# Patient Record
Sex: Female | Born: 1965 | Race: White | Hispanic: No | Marital: Married | Smoking: Never smoker
Health system: Southern US, Community
[De-identification: ages and names within clinical notes are randomized; demographics above are authoritative.]

## PROBLEM LIST (undated history)

## (undated) DIAGNOSIS — F419 Anxiety disorder, unspecified: Secondary | ICD-10-CM

## (undated) DIAGNOSIS — I82409 Acute embolism and thrombosis of unspecified deep veins of unspecified lower extremity: Secondary | ICD-10-CM

## (undated) DIAGNOSIS — F32A Depression, unspecified: Secondary | ICD-10-CM

## (undated) DIAGNOSIS — M797 Fibromyalgia: Secondary | ICD-10-CM

## (undated) DIAGNOSIS — D649 Anemia, unspecified: Secondary | ICD-10-CM

## (undated) DIAGNOSIS — F329 Major depressive disorder, single episode, unspecified: Secondary | ICD-10-CM

## (undated) DIAGNOSIS — K200 Eosinophilic esophagitis: Secondary | ICD-10-CM

## (undated) DIAGNOSIS — I10 Essential (primary) hypertension: Secondary | ICD-10-CM

## (undated) DIAGNOSIS — T7840XA Allergy, unspecified, initial encounter: Secondary | ICD-10-CM

## (undated) HISTORY — PX: SHOULDER SURGERY: SHX246

## (undated) HISTORY — DX: Eosinophilic esophagitis: K20.0

## (undated) HISTORY — DX: Essential (primary) hypertension: I10

## (undated) HISTORY — PX: FINGER SURGERY: SHX640

## (undated) HISTORY — DX: Fibromyalgia: M79.7

## (undated) HISTORY — DX: Depression, unspecified: F32.A

## (undated) HISTORY — DX: Major depressive disorder, single episode, unspecified: F32.9

## (undated) HISTORY — DX: Acute embolism and thrombosis of unspecified deep veins of unspecified lower extremity: I82.409

## (undated) HISTORY — DX: Anxiety disorder, unspecified: F41.9

## (undated) HISTORY — PX: FRACTURE SURGERY: SHX138

## (undated) HISTORY — DX: Allergy, unspecified, initial encounter: T78.40XA

## (undated) HISTORY — DX: Anemia, unspecified: D64.9

---

## 2001-07-13 ENCOUNTER — Other Ambulatory Visit: Admission: RE | Admit: 2001-07-13 | Discharge: 2001-07-13 | Payer: Self-pay | Admitting: Family Medicine

## 2002-01-07 ENCOUNTER — Encounter: Payer: Self-pay | Admitting: Neurology

## 2002-01-07 ENCOUNTER — Ambulatory Visit (HOSPITAL_COMMUNITY): Admission: RE | Admit: 2002-01-07 | Discharge: 2002-01-07 | Payer: Self-pay | Admitting: Neurology

## 2002-01-16 ENCOUNTER — Ambulatory Visit (HOSPITAL_COMMUNITY): Admission: RE | Admit: 2002-01-16 | Discharge: 2002-01-16 | Payer: Self-pay | Admitting: Neurology

## 2002-08-21 ENCOUNTER — Other Ambulatory Visit: Admission: RE | Admit: 2002-08-21 | Discharge: 2002-08-21 | Payer: Self-pay | Admitting: Family Medicine

## 2002-09-20 ENCOUNTER — Encounter: Payer: Self-pay | Admitting: Neurology

## 2002-09-20 ENCOUNTER — Ambulatory Visit (HOSPITAL_COMMUNITY): Admission: RE | Admit: 2002-09-20 | Discharge: 2002-09-20 | Payer: Self-pay | Admitting: Neurology

## 2002-11-09 ENCOUNTER — Ambulatory Visit (HOSPITAL_BASED_OUTPATIENT_CLINIC_OR_DEPARTMENT_OTHER): Admission: RE | Admit: 2002-11-09 | Discharge: 2002-11-09 | Payer: Self-pay | Admitting: *Deleted

## 2003-09-03 ENCOUNTER — Other Ambulatory Visit: Admission: RE | Admit: 2003-09-03 | Discharge: 2003-09-03 | Payer: Self-pay | Admitting: Family Medicine

## 2004-09-22 ENCOUNTER — Other Ambulatory Visit: Admission: RE | Admit: 2004-09-22 | Discharge: 2004-09-22 | Payer: Self-pay | Admitting: Family Medicine

## 2004-09-25 ENCOUNTER — Encounter: Admission: RE | Admit: 2004-09-25 | Discharge: 2004-09-25 | Payer: Self-pay | Admitting: Neurology

## 2005-10-28 ENCOUNTER — Other Ambulatory Visit: Admission: RE | Admit: 2005-10-28 | Discharge: 2005-10-28 | Payer: Self-pay | Admitting: Family Medicine

## 2010-12-29 ENCOUNTER — Encounter: Payer: Self-pay | Admitting: Cardiology

## 2010-12-30 ENCOUNTER — Ambulatory Visit (INDEPENDENT_AMBULATORY_CARE_PROVIDER_SITE_OTHER): Payer: Federal, State, Local not specified - PPO | Admitting: Cardiology

## 2010-12-30 ENCOUNTER — Encounter: Payer: Self-pay | Admitting: Cardiology

## 2010-12-30 VITALS — BP 124/88 | HR 86 | Resp 16 | Ht 67.0 in | Wt 167.0 lb

## 2010-12-30 DIAGNOSIS — R002 Palpitations: Secondary | ICD-10-CM

## 2010-12-30 NOTE — Patient Instructions (Addendum)
Your physician has requested that you have an echocardiogram. Echocardiography is a painless test that uses sound waves to create images of your heart. It provides your doctor with information about the size and shape of your heart and how well your heart's chambers and valves are working. This procedure takes approximately one hour. There are no restrictions for this procedure.  You will be called with the results of your testing when available.  The current medical regimen is effective;  continue present plan and medications.  Follow up as needed

## 2010-12-30 NOTE — Progress Notes (Signed)
HPI The patient presents for evaluation of palpitations. She says these have been going on for years. She does report an echocardiogram when she was a teenager. She did wear a recent echo Holter that demonstrated premature ventricular contractions. There were no sustained dysrhythmias. She notices these felt most days. They have been particularly when she is tired. It may be somewhat positional. She might get somewhat lightheaded when standing. She's not had any presyncope or syncope. She denies any chest pressure, neck or arm discomfort. She does have some shortness of breath with exertion which has been long-standing but no resting shortness of breath, PND or orthopnea.  She did have a normal TSH recently.  Allergies  Allergen Reactions  . Penicillins     Current Outpatient Prescriptions  Medication Sig Dispense Refill  . escitalopram (LEXAPRO) 10 MG tablet Take 10 mg by mouth daily.        Alain Honey Triphasic (TRIPHASIL, 28, PO) Take by mouth.        . Milnacipran HCl (SAVELLA) 25 MG TABS Take by mouth daily.        . traMADol (ULTRAM) 50 MG tablet Take 50 mg by mouth every 6 (six) hours as needed.          Past Medical History  Diagnosis Date  . Fibromyalgia     Questionable    Past Surgical History  Procedure Date  . Shoulder surgery     Left    Family History  Problem Relation Age of Onset  . Cancer Father     Throat  . Hypertension Mother     History   Social History  . Marital Status: Married    Spouse Name: N/A    Number of Children: N/A  . Years of Education: N/A   Occupational History  . Works with Horses    Social History Main Topics  . Smoking status: Former Games developer  . Smokeless tobacco: Not on file  . Alcohol Use: Not on file  . Drug Use: Not on file  . Sexually Active: Not on file   Other Topics Concern  . Not on file   Social History Narrative  . No narrative on file    ROS:  As stated in the HPI and negative for all other  systems.   PHYSICAL EXAM BP 124/88  Pulse 86  Resp 16  Ht 5\' 7"  (1.702 m)  Wt 167 lb (75.751 kg)  BMI 26.16 kg/m2 GENERAL:  Well appearing HEENT:  Pupils equal round and reactive, fundi not visualized, oral mucosa unremarkable NECK:  No jugular venous distention, waveform within normal limits, carotid upstroke brisk and symmetric, no bruits, no thyromegaly LYMPHATICS:  No cervical, inguinal adenopathy LUNGS:  Clear to auscultation bilaterally BACK:  No CVA tenderness CHEST:  Unremarkable HEART:  PMI not displaced or sustained,S1 and S2 within normal limits, no S3, no S4, no clicks, no rubs, no murmurs ABD:  Flat, positive bowel sounds normal in frequency in pitch, no bruits, no rebound, no guarding, no midline pulsatile mass, no hepatomegaly, no splenomegaly EXT:  2 plus pulses throughout, no edema, no cyanosis no clubbing SKIN:  No rashes no nodules NEURO:  Cranial nerves II through XII grossly intact, motor grossly intact throughout PSYCH:  Cognitively intact, oriented to person place and time  EKG:  Sinus rhythm, rate 76, axis within normal limits, intervals within normal limits, no acute ST-T wave changes.  11/30/10   ASSESSMENT AND PLAN

## 2010-12-30 NOTE — Assessment & Plan Note (Signed)
I reviewed all available records including reviewing the entire Holter monitor tracing. A review of labs. At this point I will order an echocardiogram. She is going to quit caffeine. If the symptoms improve or her echo is normal no workup otherwise would be required. If she has continued symptoms I would consider calcium channel blocker or beta blocker.

## 2010-12-31 ENCOUNTER — Encounter: Payer: Self-pay | Admitting: Cardiology

## 2011-01-07 ENCOUNTER — Ambulatory Visit (HOSPITAL_COMMUNITY): Payer: Federal, State, Local not specified - PPO | Attending: Cardiology

## 2011-01-07 DIAGNOSIS — R0989 Other specified symptoms and signs involving the circulatory and respiratory systems: Secondary | ICD-10-CM | POA: Insufficient documentation

## 2011-01-07 DIAGNOSIS — I379 Nonrheumatic pulmonary valve disorder, unspecified: Secondary | ICD-10-CM | POA: Insufficient documentation

## 2011-01-07 DIAGNOSIS — I059 Rheumatic mitral valve disease, unspecified: Secondary | ICD-10-CM | POA: Insufficient documentation

## 2011-01-07 DIAGNOSIS — R0609 Other forms of dyspnea: Secondary | ICD-10-CM | POA: Insufficient documentation

## 2011-01-07 DIAGNOSIS — I079 Rheumatic tricuspid valve disease, unspecified: Secondary | ICD-10-CM | POA: Insufficient documentation

## 2011-01-07 DIAGNOSIS — R002 Palpitations: Secondary | ICD-10-CM | POA: Insufficient documentation

## 2011-01-15 ENCOUNTER — Telehealth: Payer: Self-pay | Admitting: Cardiology

## 2011-01-15 NOTE — Telephone Encounter (Signed)
Pt is aware of normal echo results 

## 2011-01-15 NOTE — Telephone Encounter (Signed)
Pt calling wanting to know results of pt ECHO. Please return call to discuss further.

## 2012-07-24 ENCOUNTER — Other Ambulatory Visit: Payer: Self-pay | Admitting: *Deleted

## 2012-08-08 ENCOUNTER — Ambulatory Visit (INDEPENDENT_AMBULATORY_CARE_PROVIDER_SITE_OTHER): Payer: Federal, State, Local not specified - PPO | Admitting: Family Medicine

## 2012-08-08 ENCOUNTER — Encounter: Payer: Self-pay | Admitting: Family Medicine

## 2012-08-08 VITALS — BP 144/93 | HR 83 | Temp 99.1°F | Wt 183.6 lb

## 2012-08-08 DIAGNOSIS — IMO0001 Reserved for inherently not codable concepts without codable children: Secondary | ICD-10-CM | POA: Insufficient documentation

## 2012-08-08 DIAGNOSIS — F32A Depression, unspecified: Secondary | ICD-10-CM | POA: Insufficient documentation

## 2012-08-08 DIAGNOSIS — F3289 Other specified depressive episodes: Secondary | ICD-10-CM

## 2012-08-08 DIAGNOSIS — F329 Major depressive disorder, single episode, unspecified: Secondary | ICD-10-CM

## 2012-08-08 DIAGNOSIS — R635 Abnormal weight gain: Secondary | ICD-10-CM | POA: Insufficient documentation

## 2012-08-08 DIAGNOSIS — M797 Fibromyalgia: Secondary | ICD-10-CM | POA: Insufficient documentation

## 2012-08-08 DIAGNOSIS — R030 Elevated blood-pressure reading, without diagnosis of hypertension: Secondary | ICD-10-CM

## 2012-08-08 MED ORDER — ESCITALOPRAM OXALATE 5 MG PO TABS: 5.0000 mg | ORAL_TABLET | Freq: Every day | ORAL | Status: DC

## 2012-08-08 MED ORDER — TRAMADOL HCL 50 MG PO TABS: 50.0000 mg | ORAL_TABLET | Freq: Four times a day (QID) | ORAL | Status: DC | PRN

## 2012-08-08 NOTE — Assessment & Plan Note (Signed)
Persistent and needs additional work up. Vitamin D, ferritin.TSH.

## 2012-08-08 NOTE — Progress Notes (Signed)
Patient ID: Rita Barry, female   DOB: [birth year], 47 y.o.   MRN: [id number] SUBJECTIVE:  HPI: Here for: Depression: the lexapro hasn't really help. Patient is a horse massage therapist. Fibromyalgia: restless at nights.Not restless legs syndrome. Has nightmares. Used to work in [employer] and since changing career has had decreased drive and fatigue. Weight gain. Stress : some and BP elevated today.   PMH/PSH: reviewed/updated in Epic  SH/FH: reviewed/updated in Epic  Allergies: reviewed/updated in Epic  Medications: reviewed/updated in Epic  Immunizations: reviewed/updated in Epic   ROS: As above in the HPI. All other systems are stable or negative.   OBJECTIVE: APPEARANCE:  Patient in no acute distress.The patient appeared well nourished and normally developed. Acyanotic.  Waist:  VITAL SIGNS:  SKIN: warm and  Dry without overt rashes, tattoos and scars  HEAD and Neck: without JVD, Normal No scleral icterus  CHEST & LUNGS: Clear  CVS: Reveals the PMI to be normally located. Regular rhythm, First and Second Heart sounds are normal, and absence of murmurs, rubs or gallops.  ABDOMEN:  Benign,, no organomegaly, no masses, no Abdominal Aortic enlargement. No Guarding , no rebound. No Bruits.  RECTAL:n/a  GU:N/a, deferred to GYN.  EXTREMETIES: nonedematous. Both Femoral and Pedal pulses are normal.  MUSCULOSKELETAL:  Spine: normal Joints: intact  NEUROLOGIC: oriented to time,place and person; nonfocal. Strength is normal Sensory is normal Reflexes are normal Cranial Nerves are normal.  Psychological: not suicidal, not hallucinating  ASSESSMENT: Fibromyalgia Persistent and needs additional work up. Vitamin D, ferritin.TSH.  Depression I suspect that patient needs to explore her life goals. Discussed this with her. Also await labs. Possible sleep deprivation because of interrupted sleep cycles.  Elevated BP Advised to review diet and  monitor BP daily and bring in. If BP is 135/85 and higher will need to see in 2 months.  Weight gain Await labs, cbc , cmp, tsh.    PLAN: Orders Placed This Encounter  Procedures  . Vitamin D 25 hydroxy    Standing Status: Future     Number of Occurrences:      Standing Expiration Date: 08/08/2013  . COMPLETE METABOLIC PANEL WITH GFR    Standing Status: Future     Number of Occurrences:      Standing Expiration Date: 08/08/2013  . TSH    Standing Status: Future     Number of Occurrences:      Standing Expiration Date: 08/08/2013  . Ferritin    Standing Status: Future     Number of Occurrences:      Standing Expiration Date: 08/08/2013  . CBC with Differential    Standing Status: Future     Number of Occurrences:      Standing Expiration Date: 08/08/2013   No results found for this or any previous visit (from the past 24 hour(s)).                               Meds ordered this encounter  Medications  . escitalopram (LEXAPRO) 5 MG tablet    Sig: Take 1 tablet (5 mg total) by mouth daily.    Dispense:  30 tablet    Refill:  5  . traMADol (ULTRAM) 50 MG tablet    Sig: Take 1 tablet (50 mg total) by mouth every 6 (six) hours as needed.    Dispense:  30 tablet    Refill:  1  RTC 3  months.  Veola Cafaro P. Modesto Charon, M.D.

## 2012-08-08 NOTE — Assessment & Plan Note (Signed)
Advised to review diet and monitor BP daily and bring in. If BP is 135/85 and higher will need to see in 2 months.

## 2012-08-08 NOTE — Assessment & Plan Note (Signed)
I suspect that patient needs to explore her life goals. Discussed this with her. Also await labs. Possible sleep deprivation because of interrupted sleep cycles.

## 2012-08-08 NOTE — Assessment & Plan Note (Signed)
Await labs, cbc , cmp, tsh.

## 2012-08-09 ENCOUNTER — Other Ambulatory Visit: Payer: Federal, State, Local not specified - PPO

## 2012-08-09 DIAGNOSIS — F32A Depression, unspecified: Secondary | ICD-10-CM

## 2012-08-09 DIAGNOSIS — M797 Fibromyalgia: Secondary | ICD-10-CM

## 2012-08-09 DIAGNOSIS — F329 Major depressive disorder, single episode, unspecified: Secondary | ICD-10-CM

## 2012-08-09 DIAGNOSIS — IMO0001 Reserved for inherently not codable concepts without codable children: Secondary | ICD-10-CM

## 2012-08-09 LAB — POCT CBC
Granulocyte percent: 72.2 %G (ref 37–80)
HCT, POC: 36.4 % — AB (ref 37.7–47.9)
Hemoglobin: 12.2 g/dL (ref 12.2–16.2)
Lymph, poc: 1.6 (ref 0.6–3.4)
MCH, POC: 32.1 pg — AB (ref 27–31.2)
MCHC: 33.6 g/dL (ref 31.8–35.4)
MCV: 95.6 fL (ref 80–97)
MPV: 7.6 fL (ref 0–99.8)
POC Granulocyte: 5.6 (ref 2–6.9)
POC LYMPH PERCENT: 20.2 %L (ref 10–50)
Platelet Count, POC: 328 10*3/uL (ref 142–424)
RBC: 3.8 M/uL — AB (ref 4.04–5.48)
RDW, POC: 13.8 %
WBC: 7.7 10*3/uL (ref 4.6–10.2)

## 2012-08-09 LAB — COMPLETE METABOLIC PANEL WITH GFR
ALT: 22 U/L (ref 0–35)
AST: 22 U/L (ref 0–37)
Albumin: 4 g/dL (ref 3.5–5.2)
Alkaline Phosphatase: 17 U/L — ABNORMAL LOW (ref 39–117)
BUN: 14 mg/dL (ref 6–23)
CO2: 25 mEq/L (ref 19–32)
Calcium: 9 mg/dL (ref 8.4–10.5)
Chloride: 101 mEq/L (ref 96–112)
Creat: 0.66 mg/dL (ref 0.50–1.10)
GFR, Est African American: >89 mL/min
GFR, Est Non African American: >89 mL/min
Glucose, Bld: 85 mg/dL (ref 70–99)
Potassium: 4.2 mEq/L (ref 3.5–5.3)
Sodium: 135 mEq/L (ref 135–145)
Total Bilirubin: 0.6 mg/dL (ref 0.3–1.2)
Total Protein: 6.6 g/dL (ref 6.0–8.3)

## 2012-08-09 LAB — FERRITIN: Ferritin: 80 ng/mL (ref 10–291)

## 2012-08-09 LAB — TSH: TSH: 1.386 u[IU]/mL (ref 0.350–4.500)

## 2012-08-09 NOTE — Progress Notes (Signed)
Pt came in for labs only 

## 2012-08-10 LAB — VITAMIN D 25 HYDROXY (VIT D DEFICIENCY, FRACTURES): Vit D, 25-Hydroxy: 46 ng/mL (ref 30–89)

## 2012-08-11 NOTE — Progress Notes (Signed)
Quick Note:  Call patient. Labs normal. No change in plan. ______ 

## 2012-08-14 ENCOUNTER — Other Ambulatory Visit: Payer: Self-pay | Admitting: *Deleted

## 2012-08-14 MED ORDER — LEVONORG-ETH ESTRAD TRIPHASIC PO TABS: 1.0000 | ORAL_TABLET | Freq: Every day | ORAL | Status: DC

## 2012-08-14 NOTE — Telephone Encounter (Signed)
LAST PAP 5/13

## 2012-10-02 ENCOUNTER — Other Ambulatory Visit: Payer: Self-pay | Admitting: Family Medicine

## 2012-10-03 NOTE — Telephone Encounter (Signed)
Last seen and filled by you on 08/08/12

## 2012-11-02 ENCOUNTER — Other Ambulatory Visit: Payer: Self-pay | Admitting: Family Medicine

## 2012-11-02 NOTE — Telephone Encounter (Signed)
Patient last seen in office on 08-18-12 by Modesto Charon. Please advise

## 2012-11-08 ENCOUNTER — Ambulatory Visit (INDEPENDENT_AMBULATORY_CARE_PROVIDER_SITE_OTHER): Payer: Federal, State, Local not specified - PPO | Admitting: Family Medicine

## 2012-11-08 ENCOUNTER — Encounter: Payer: Self-pay | Admitting: Family Medicine

## 2012-11-08 VITALS — BP 142/91 | HR 79 | Temp 98.8°F | Ht 67.5 in | Wt 176.0 lb

## 2012-11-08 DIAGNOSIS — F3289 Other specified depressive episodes: Secondary | ICD-10-CM

## 2012-11-08 DIAGNOSIS — R030 Elevated blood-pressure reading, without diagnosis of hypertension: Secondary | ICD-10-CM

## 2012-11-08 DIAGNOSIS — F32A Depression, unspecified: Secondary | ICD-10-CM

## 2012-11-08 DIAGNOSIS — IMO0001 Reserved for inherently not codable concepts without codable children: Secondary | ICD-10-CM

## 2012-11-08 DIAGNOSIS — M797 Fibromyalgia: Secondary | ICD-10-CM

## 2012-11-08 DIAGNOSIS — F329 Major depressive disorder, single episode, unspecified: Secondary | ICD-10-CM

## 2012-11-08 MED ORDER — TRAMADOL HCL 50 MG PO TABS: ORAL_TABLET | ORAL | Status: DC

## 2012-11-08 MED ORDER — ESCITALOPRAM OXALATE 5 MG PO TABS: 20.0000 mg | ORAL_TABLET | Freq: Every day | ORAL | Status: DC

## 2012-11-08 NOTE — Progress Notes (Signed)
Patient ID: Rita Barry, female   DOB: June 01, 1965, 47 y.o.   MRN: [id number] SUBJECTIVE: CC: Came for  Follow up of meds HPI: Was doing fairly well until she cut the lexapro and noticed that she did not feel well and had low energy and felt a lack of interest. Not really depressed. She is now convinced she needs to stay on the lexapro and may even need to increase the dose, because she felt  Better when it was increased. Lost some  Weight and working on being healthier. Time occupied helping to take care of Hurshel Keys whose Preacher husband  Died recently.not much time for herself. 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 Has monitored her BPs and they run a little above normal up to 140/90 sometimes, but mostly 130s/80s. Would like to work on diet and exercise to reduce the BP more.  Past Medical History  Diagnosis Date  . Fibromyalgia     Questionable  . Anxiety   . Depression    Past Surgical History  Procedure Laterality Date  . Shoulder surgery      Left  . Shoulder surgery Left     x2 surgeries   History   Social History  . Marital Status: Married    Spouse Name: N/A    Number of Children: N/A  . Years of Education: N/A   Occupational History  . Works with Horses    Social History Main Topics  . Smoking status: Never Smoker   . Smokeless tobacco: Never Used  . Alcohol Use: 2.4 oz/week    4 Glasses of wine per week  . Drug Use: No  . Sexually Active: Not on file   Other Topics Concern  . Not on file   Social History Narrative  . No narrative on file   Family History  Problem Relation Age of Onset  . Cancer Father     Throat  . Hypertension Mother   . Hyperlipidemia Mother   . Cancer Mother    Current Outpatient Prescriptions on File Prior to Visit  Medication Sig Dispense Refill  . levonorgestrel-ethinyl estradiol (ENPRESSE-28) tablet Take 1 tablet by mouth daily.  1 Package  2   No current facility-administered  medications on file prior to visit.   Allergies  Allergen Reactions  . Penicillins     There is no immunization history on file for this patient. Prior to Admission medications   Medication Sig Start Date End Date Taking? Authorizing Provider  escitalopram (LEXAPRO) 5 MG tablet Take 4 tablets (20 mg total) by mouth daily. 11/08/12  Yes Ileana Ladd, MD  levonorgestrel-ethinyl estradiol [facility]) tablet Take 1 tablet by mouth daily. 08/14/12  Yes Mary-Margaret Daphine Deutscher, FNP  traMADol (ULTRAM) 50 MG tablet TAKE 1 TABLET BY MOUTH EVERY 6 HOURS AS NEEDED 11/08/12  Yes Ileana Ladd, MD     ROS: As above in the HPI. All other systems are stable or negative.  OBJECTIVE: APPEARANCE:  Patient in no acute distress.The patient appeared well nourished and normally developed. Acyanotic. Waist: VITAL SIGNS:BP 142/91  Pulse 79  Temp(Src) 98.8 F (37.1 C) (Oral)  Ht 5' 7.5" (1.715 m)  Wt 176 lb (79.833 kg)  BMI 27.14 kg/m2 WF Overweight.  SKIN: warm and  Dry without overt rashes, tattoos and scars. No lesions  Seen but she c/o having sun exposure and small tender bumps popping up.  HEAD and Neck: without JVD, Head and scalp: normal Eyes:No scleral  icterus. Fundi normal, eye movements normal. Ears: Auricle normal, canal normal, Tympanic membranes normal, insufflation normal. Nose: normal Throat: normal Neck & thyroid: normal  CHEST & LUNGS: Chest wall: normal Lungs: Clear  CVS: Reveals the PMI to be normally located. Regular rhythm, First and Second Heart sounds are normal,  absence of murmurs, rubs or gallops. Peripheral vasculature: Radial pulses: normal Dorsal pedis pulses: normal Posterior pulses: normal  ABDOMEN:  Appearance: overweigt Benign, no organomegaly, no masses, no Abdominal Aortic enlargement. No Guarding , no rebound. No Bruits. Bowel sounds: normal  RECTAL: N/A GU: N/A  EXTREMETIES: nonedematous. Both Femoral and Pedal pulses are  normal.  MUSCULOSKELETAL:  Spine: normal Joints: knees and hips, pain on ROM is mild. Muscles, mild  Trigger points for fibromyalgias  NEUROLOGIC: oriented to time,place and person; nonfocal. Strength is normal Sensory is normal Reflexes are normal Cranial Nerves are normal.  Psychiatry: not suicidal, not hallucinating. Does c/o of  Fatigue and low energy.  Results for orders placed in visit on 08/09/12  VITAMIN D 25 HYDROXY      Result Value Range   Vit D, 25-Hydroxy 46  30 - 89 ng/mL  COMPLETE METABOLIC PANEL WITH GFR      Result Value Range   Sodium 135  135 - 145 mEq/L   Potassium 4.2  3.5 - 5.3 mEq/L   Chloride 101  96 - 112 mEq/L   CO2 25  19 - 32 mEq/L   Glucose, Bld 85  70 - 99 mg/dL   BUN 14  6 - 23 mg/dL   Creat 4.09  8.11 - 9.14 mg/dL   Total Bilirubin 0.6  0.3 - 1.2 mg/dL   Alkaline Phosphatase 17 (*) 39 - 117 U/L   AST 22  0 - 37 U/L   ALT 22  0 - 35 U/L   Total Protein 6.6  6.0 - 8.3 g/dL   Albumin 4.0  3.5 - 5.2 g/dL   Calcium 9.0  8.4 - 78.2 mg/dL   GFR, Est African American >89     GFR, Est Non African American >89    TSH      Result Value Range   TSH 1.386  0.350 - 4.500 uIU/mL  FERRITIN      Result Value Range   Ferritin 80  10 - 291 ng/mL  POCT CBC      Result Value Range   WBC 7.7  4.6 - 10.2 K/uL   Lymph, poc 1.6  0.6 - 3.4   POC LYMPH PERCENT 20.2  10 - 50 %L   MID (cbc)    0 - 0.9   POC MID %    0 - 12 %M   POC Granulocyte 5.6  2 - 6.9   Granulocyte percent 72.2  37 - 80 %G   RBC 3.8 (*) 4.04 - 5.48 M/uL   Hemoglobin 12.2  12.2 - 16.2 g/dL   HCT, POC 95.6 (*) 21.3 - 47.9 %   MCV 95.6  80 - 97 fL   MCH, POC 32.1 (*) 27 - 31.2 pg   MCHC 33.6  31.8 - 35.4 g/dL   RDW, POC 08.6     Platelet Count, POC 328.0  142 - 424 K/uL   MPV 7.6  0 - 99.8 fL    ASSESSMENT: Fibromyalgia - Plan: traMADol (ULTRAM) 50 MG tablet  Depression - Plan: escitalopram (LEXAPRO) 5 MG tablet  Elevated BP   PLAN: Recommended Dr Terri Piedra for   Dermatology. Patient to  let me know if she needs a referral for her scalp concerns.   Meds ordered this encounter  Medications  . escitalopram (LEXAPRO) 5 MG tablet    Sig: Take 4 tablets (20 mg total) by mouth daily.    Dispense:  30 tablet    Refill:  5  . traMADol (ULTRAM) 50 MG tablet    Sig: TAKE 1 TABLET BY MOUTH EVERY 6 HOURS AS NEEDED    Dispense:  30 tablet    Refill:  2   DASH diet, exercise weight loss, balancing her life with time for herself. Counselled.  Return in about 3 months (around 02/08/2013) for Recheck medical problems.  Kyrin Garn P. Modesto Charon, M.D.

## 2012-11-09 ENCOUNTER — Other Ambulatory Visit: Payer: Self-pay | Admitting: Nurse Practitioner

## 2012-11-10 NOTE — Telephone Encounter (Signed)
Last saw you 2 days ago

## 2012-12-04 ENCOUNTER — Encounter: Payer: Self-pay | Admitting: *Deleted

## 2012-12-18 ENCOUNTER — Telehealth: Payer: Self-pay | Admitting: Family Medicine

## 2012-12-19 ENCOUNTER — Other Ambulatory Visit: Payer: Self-pay | Admitting: *Deleted

## 2012-12-19 ENCOUNTER — Other Ambulatory Visit: Payer: Self-pay | Admitting: Nurse Practitioner

## 2012-12-19 MED ORDER — LEVONORG-ETH ESTRAD TRIPHASIC PO TABS: ORAL_TABLET | ORAL | Status: DC

## 2012-12-19 NOTE — Telephone Encounter (Signed)
LAST PAP 5/13. LAST OV 7/14

## 2013-01-02 ENCOUNTER — Other Ambulatory Visit: Payer: Self-pay | Admitting: Family Medicine

## 2013-01-04 ENCOUNTER — Telehealth: Payer: Self-pay | Admitting: *Deleted

## 2013-01-04 ENCOUNTER — Encounter: Payer: Self-pay | Admitting: Family Medicine

## 2013-01-04 ENCOUNTER — Ambulatory Visit (INDEPENDENT_AMBULATORY_CARE_PROVIDER_SITE_OTHER): Payer: Federal, State, Local not specified - PPO | Admitting: Family Medicine

## 2013-01-04 VITALS — BP 149/91 | HR 83 | Temp 99.0°F | Ht 67.25 in | Wt 176.0 lb

## 2013-01-04 DIAGNOSIS — H9190 Unspecified hearing loss, unspecified ear: Secondary | ICD-10-CM

## 2013-01-04 DIAGNOSIS — R42 Dizziness and giddiness: Secondary | ICD-10-CM

## 2013-01-04 MED ORDER — MECLIZINE HCL 25 MG PO TABS: 25.0000 mg | ORAL_TABLET | Freq: Three times a day (TID) | ORAL | Status: DC | PRN

## 2013-01-04 NOTE — Progress Notes (Signed)
  Subjective:    Patient ID: Rita Barry, female    DOB: 1965-08-08, 47 y.o.   MRN: [id number]  HPI  Patient presents today with chief complaint of dizziness and hearing loss. Patient states she has a prior history of vertiginous symptoms and has to have been related to questionable ear infections. Patient states she's had intermittent dizziness, headache, decreased hearing over the past 2-3 days. Predominant symptoms started over the weekend. Symptoms resolved on Sunday. Symptoms seem to acutely worsen as a beginning of the week started. Patient denies any recent infections. No fevers or chills. Patient also has a mild generalized headache. Patient states that symptoms are similar to previous episodes of vertigo however headache and hearing loss he be worsen. Has been on meclizine the past and this has seemed to help in the past. No recent trauma.   Review of Systems  All other systems reviewed and are negative.       Objective:   Physical Exam  Constitutional: She is oriented to person, place, and time. She appears well-developed and well-nourished.  HENT:  Head: Normocephalic and atraumatic.  Right Ear: External ear normal.  Left Ear: External ear normal.  Mouth/Throat: Oropharynx is clear and moist.  Eyes: Conjunctivae are normal. Pupils are equal, round, and reactive to light.  Neck: Normal range of motion.  Cardiovascular: Normal rate, regular rhythm and normal heart sounds.   Pulmonary/Chest: Effort normal and breath sounds normal.  Abdominal: Soft.  Neurological: She is alert and oriented to person, place, and time.  + horizontal nystagmus bilaterally  + dix hallpike bilaterally    Skin: Skin is warm.          Assessment & Plan:  Vertigo - Plan: Ambulatory referral to ENT, meclizine (ANTIVERT) 25 MG tablet, PR TYMPANOMETRY  Dizziness  Decreased hearing, unspecified laterality  Differential diagnosis for symptoms as fairly broad including benign positional  vertigo, labyrinthitis, Mnire's disease, intracranial pathology. Will start patient on meclizine for symptomatic treatment. Hearing screen here in clinic was initially normal. However, will need formal testing. Will refer patient to ENT for further evaluation of symptoms. Discussed neurologic as well as ENT red flags at length. Followup as needed.

## 2013-01-04 NOTE — Telephone Encounter (Signed)
Seen and filled 11/08/12, you gave 2 refills, but we can not give refills on Tramadol anymore, if approved route to pool B, to call pt to pickup

## 2013-01-04 NOTE — Telephone Encounter (Signed)
Patient has remaining refills on Tramadol but since it has changed categories the pharmacy needed an authorization from our office to fill it.  They told the patient they would contact us about refilling it but I don't see any documentation.  Patient was also told at her last appt that she could take up to 4 of her Lexapro 5mg  to equal 20mg  a day but she was only given a script for #30.  Her insurance wouldn't pay for a refill quicker than 30 days.  Would you like to change this prescription?  She has gone back to taking 5mg  daily.  She has a f/u appt with Dr. Modesto Charon in October.

## 2013-01-09 ENCOUNTER — Ambulatory Visit (INDEPENDENT_AMBULATORY_CARE_PROVIDER_SITE_OTHER): Payer: Federal, State, Local not specified - PPO | Admitting: Obstetrics & Gynecology

## 2013-01-09 ENCOUNTER — Encounter: Payer: Self-pay | Admitting: Obstetrics & Gynecology

## 2013-01-09 VITALS — BP 141/89 | HR 84 | Resp 16 | Ht 67.0 in | Wt 179.0 lb

## 2013-01-09 DIAGNOSIS — Z3009 Encounter for other general counseling and advice on contraception: Secondary | ICD-10-CM

## 2013-01-09 DIAGNOSIS — N946 Dysmenorrhea, unspecified: Secondary | ICD-10-CM

## 2013-01-09 MED ORDER — LEVONORGEST-ETH ESTRAD 91-DAY 0.15-0.03 &0.01 MG PO TABS: 1.0000 | ORAL_TABLET | Freq: Every day | ORAL | Status: DC

## 2013-01-09 NOTE — Patient Instructions (Signed)
Oral Contraception Use  Oral contraceptives (OCs) are medicines taken to prevent pregnancy. OCs work by preventing the ovaries from releasing eggs. The hormones in OCs also cause the cervical mucus to thicken, preventing the sperm from entering the uterus. The hormones also cause the uterine lining to become thin, not allowing a fertilized egg to attach to the inside of the uterus. OCs are highly effective when taken exactly as prescribed. However, OCs do not prevent sexually transmitted diseases (STDs). Safe sex practices, such as using condoms along with an OC, can help prevent STDs.   Before taking OCs, you may have a physical exam and Pap test. Your caregiver may also order blood tests if necessary. Your caregiver will make sure you are a good candidate for oral contraception. Discuss with your caregiver the possible side effects of the OC you may be prescribed. When starting an OC, it can take 2 to 3 months for the body to adjust to the changes in hormone levels in your body.   HOW TO TAKE ORAL CONTRACEPTIVES  Your caregiver may advise you on how to start taking the first cycle of OCs. Otherwise, you can:  · Start on day 1 of your menstrual period. You will not need any backup contraceptive protection with this start time.  · Start on the first Sunday after your menstrual period or the day you get your prescription. In these cases, you will need to use backup contraceptive protection for the first 7-day cycle.  After you have started taking OCs:  · If you forget to take 1 pill, take it as soon as you remember. Take the next pill at the regular time.  · If you miss 2 or more pills, use backup birth control until your next menstrual period starts.  · If you use a 28-day pack that contains inactive pills and you miss 1 of the last 7 pills (pills with no hormones), it will not matter. Throw away the rest of the non-hormone pills and start a new pill pack.  No matter which day you start the OC, you will always start  a new pack on that same day of the week. Have an extra pack of OCs and a backup contraceptive method available in case you miss some pills or lose your OC pack.  HOME CARE INSTRUCTIONS   · Do not smoke.  · Always use a condom to protect against STDs. OCs do not protect against STDs.  · Use a calendar to mark your menstrual period days.  · Read the information and directions that come with your OC. Talk to your caregiver if you have questions.  SEEK MEDICAL CARE IF:   · You develop nausea and vomiting.  · You have abnormal vaginal discharge or bleeding.  · You develop a rash.  · You miss your menstrual period.  · You are losing your hair.  · You need treatment for mood swings or depression.  · You get dizzy when taking the OC.  · You develop acne from taking the OC.  · You become pregnant.  SEEK IMMEDIATE MEDICAL CARE IF:   · You develop chest pain.  · You develop shortness of breath.  · You have an uncontrolled or severe headache.  · You develop numbness or slurred speech.  · You develop visual problems.  · You develop pain, redness, and swelling in the legs.  Document Released: 04/08/2011 Document Revised: 07/12/2011 Document Reviewed: 04/08/2011  [employer]® Patient Information ©2014 [employer].

## 2013-01-09 NOTE — Progress Notes (Signed)
Patient ID: Rita Barry, female   DOB: 09-03-1965, 47 y.o.   MRN: [id number]  Chief Complaint  Patient presents with  . Gynecologic Exam     HPI Rita Barry is a 47 y.o. female.  No obstetric history on file. G0 Patient's last menstrual period was 12/22/2012. On OCP for many years, recently experiencing cramps for first 2 days of regular cycle. Last pap normal 11/2011   HPI  Past Medical History  Diagnosis Date  . Fibromyalgia     Questionable  . Anxiety   . Depression     Past Surgical History  Procedure Laterality Date  . Shoulder surgery      Left  . Shoulder surgery Left     x2 surgeries    Family History  Problem Relation Age of Onset  . Cancer Father     Throat  . Hypertension Mother   . Hyperlipidemia Mother   . Cancer Mother     Social History History  Substance Use Topics  . Smoking status: Never Smoker   . Smokeless tobacco: Never Used  . Alcohol Use: 2.4 oz/week    4 Glasses of wine per week    Allergies  Allergen Reactions  . Penicillins     Current Outpatient Prescriptions  Medication Sig Dispense Refill  . diazepam (VALIUM) 10 MG tablet Take 10 mg by mouth every 6 (six) hours as needed for anxiety.      Marland Kitchen escitalopram (LEXAPRO) 5 MG tablet Take 5 mg by mouth daily.      Marland Kitchen levonorgestrel-ethinyl estradiol (ENPRESSE-28) tablet TAKE AS DIRECTED  28 tablet  1  . meclizine (ANTIVERT) 25 MG tablet Take 1 tablet (25 mg total) by mouth 3 (three) times daily as needed.  30 tablet  0  . traMADol (ULTRAM) 50 MG tablet TAKE 1 TABLET BY MOUTH EVERY 6 HOURS AS NEEDED  30 tablet  0  . Levonorgestrel-Ethinyl Estradiol (AMETHIA,CAMRESE) 0.15-0.03 &0.01 MG tablet Take 1 tablet by mouth daily.  1 Package  4   No current facility-administered medications for this visit.    Review of Systems Review of Systems  Constitutional: Negative for fever and fatigue.  Genitourinary: Positive for menstrual problem and pelvic pain. Negative for dysuria, vaginal  bleeding and vaginal discharge.    Blood pressure 141/89, pulse 84, resp. rate 16, height 5\' 7"  (1.702 m), weight 179 lb (81.194 kg), last menstrual period 12/22/2012.  Physical Exam Physical Exam  Constitutional: She is oriented to person, place, and time. She appears well-developed and well-nourished.  HENT:  Head: Normocephalic and atraumatic.  Neck: Normal range of motion.  Pulmonary/Chest: Effort normal. No respiratory distress.  Breasts without masses, nontender  Abdominal: Soft. She exhibits no distension and no mass. There is no tenderness.  Genitourinary: Vagina normal and uterus normal. No vaginal discharge found.  No mass  Neurological: She is alert and oriented to person, place, and time.  Skin: Skin is warm and dry.  Psychiatric: She has a normal mood and affect. Her behavior is normal.    Data Reviewed Office notes  Assessment    Dysmenorrhea on current OCP     Plan    Seasonique  RTC 6 months        Rita Barry 01/09/2013, 11:21 AM

## 2013-01-10 ENCOUNTER — Other Ambulatory Visit: Payer: Self-pay | Admitting: Family Medicine

## 2013-01-10 MED ORDER — ESCITALOPRAM OXALATE 20 MG PO TABS: 20.0000 mg | ORAL_TABLET | Freq: Every day | ORAL | Status: DC

## 2013-01-10 MED ORDER — TRAMADOL HCL 50 MG PO TABS: ORAL_TABLET | ORAL | Status: DC

## 2013-01-10 NOTE — Telephone Encounter (Signed)
Rx ready for Phone in.Tramadol. The lexapro was changed to 20 mg and  [facility]. Let her know. Thanks Journiee Feldkamp P. [place] Charon, M.D.

## 2013-01-16 ENCOUNTER — Other Ambulatory Visit: Payer: Self-pay | Admitting: Family Medicine

## 2013-01-16 NOTE — Telephone Encounter (Signed)
[facility] refills

## 2013-01-17 ENCOUNTER — Other Ambulatory Visit: Payer: Self-pay | Admitting: Nurse Practitioner

## 2013-01-17 NOTE — Telephone Encounter (Signed)
Prescription renewed in [facility]. 

## 2013-01-17 NOTE — Telephone Encounter (Signed)
LAST OV 01/04/13 WITH DR. Alvester Morin. LAST RF 01/04/13 FOR #30.

## 2013-01-29 ENCOUNTER — Encounter: Payer: Self-pay | Admitting: *Deleted

## 2013-02-05 ENCOUNTER — Other Ambulatory Visit: Payer: Self-pay

## 2013-02-05 MED ORDER — FLUTICASONE PROPIONATE 50 MCG/ACT NA SUSP: 2.0000 | Freq: Every day | NASAL | Status: DC

## 2013-02-14 ENCOUNTER — Ambulatory Visit: Payer: Federal, State, Local not specified - PPO | Admitting: Family Medicine

## 2013-02-20 ENCOUNTER — Ambulatory Visit: Payer: Federal, State, Local not specified - PPO | Admitting: Family Medicine

## 2013-02-23 ENCOUNTER — Ambulatory Visit: Payer: Self-pay | Admitting: Family Medicine

## 2013-02-26 ENCOUNTER — Ambulatory Visit: Payer: Self-pay | Admitting: Family Medicine

## 2013-03-02 ENCOUNTER — Ambulatory Visit (INDEPENDENT_AMBULATORY_CARE_PROVIDER_SITE_OTHER): Payer: Federal, State, Local not specified - PPO | Admitting: Family Medicine

## 2013-03-02 ENCOUNTER — Encounter: Payer: Self-pay | Admitting: Family Medicine

## 2013-03-02 VITALS — BP 152/91 | HR 82 | Temp 98.6°F | Ht 67.5 in | Wt 180.4 lb

## 2013-03-02 DIAGNOSIS — M549 Dorsalgia, unspecified: Secondary | ICD-10-CM

## 2013-03-02 DIAGNOSIS — R635 Abnormal weight gain: Secondary | ICD-10-CM

## 2013-03-02 DIAGNOSIS — M797 Fibromyalgia: Secondary | ICD-10-CM

## 2013-03-02 DIAGNOSIS — H8110 Benign paroxysmal vertigo, unspecified ear: Secondary | ICD-10-CM

## 2013-03-02 DIAGNOSIS — R030 Elevated blood-pressure reading, without diagnosis of hypertension: Secondary | ICD-10-CM

## 2013-03-02 DIAGNOSIS — IMO0001 Reserved for inherently not codable concepts without codable children: Secondary | ICD-10-CM

## 2013-03-02 MED ORDER — TRAMADOL HCL 50 MG PO TABS: ORAL_TABLET | ORAL | Status: DC

## 2013-03-02 NOTE — Patient Instructions (Addendum)
DASH Diet The DASH diet stands for "Dietary Approaches to Stop Hypertension." It is a healthy eating plan that has been shown to reduce high blood pressure (hypertension) in as little as 14 days, while also possibly providing other significant health benefits. These other health benefits include reducing the risk of breast cancer after menopause and reducing the risk of type 2 diabetes, heart disease, colon cancer, and stroke. Health benefits also include weight loss and slowing kidney failure in patients with chronic kidney disease.  DIET GUIDELINES  Limit salt (sodium). Your diet should contain less than 1500 mg of sodium daily.  Limit refined or processed carbohydrates. Your diet should include mostly whole grains. Desserts and added sugars should be used sparingly.  Include small amounts of heart-healthy fats. These types of fats include nuts, oils, and tub margarine. Limit saturated and trans fats. These fats have been shown to be harmful in the body. CHOOSING FOODS  The following food groups are based on a 2000 calorie diet. See your Registered Dietitian for individual calorie needs. Grains and Grain Products (6 to 8 servings daily)  Eat More Often: Whole-wheat bread, brown rice, whole-grain or wheat pasta, quinoa, popcorn without added fat or salt (air popped).  Eat Less Often: White bread, white pasta, white rice, cornbread. Vegetables (4 to 5 servings daily)  Eat More Often: Fresh, frozen, and canned vegetables. Vegetables may be raw, steamed, roasted, or grilled with a minimal amount of fat.  Eat Less Often/Avoid: Creamed or fried vegetables. Vegetables in a cheese sauce. Fruit (4 to 5 servings daily)  Eat More Often: All fresh, canned (in natural juice), or frozen fruits. Dried fruits without added sugar. One hundred percent fruit juice ( cup [237 mL] daily).  Eat Less Often: Dried fruits with added sugar. Canned fruit in light or heavy syrup. [employer], Fish, and Poultry (2  servings or less daily. One serving is 3 to 4 oz [85-114 g]).  Eat More Often: Ninety percent or leaner ground beef, tenderloin, sirloin. Round cuts of beef, chicken breast, [place] breast. All fish. Grill, bake, or broil your meat. Nothing should be fried.  Eat Less Often/Avoid: Fatty cuts of meat, [place], or chicken leg, thigh, or wing. Fried cuts of meat or fish. Dairy (2 to 3 servings)  Eat More Often: Low-fat or fat-free milk, low-fat plain or light yogurt, reduced-fat or part-skim cheese.  Eat Less Often/Avoid: Milk (whole, 2%).Whole milk yogurt. Full-fat cheeses. Nuts, Seeds, and Legumes (4 to 5 servings per week)  Eat More Often: All without added salt.  Eat Less Often/Avoid: Salted nuts and seeds, canned beans with added salt. Fats and Sweets (limited)  Eat More Often: Vegetable oils, tub margarines without trans fats, sugar-free gelatin. Mayonnaise and salad dressings.  Eat Less Often/Avoid: Coconut oils, palm oils, butter, stick margarine, cream, half and half, cookies, candy, pie. FOR MORE INFORMATION The Dash Diet Eating Plan: www.dashdiet.org Document Released: 04/08/2011 Document Revised: 07/12/2011 Document Reviewed: 04/08/2011 [facility] Patient Information 2014 [place], [place].        Dr Woodroe Mode Recommendations  For nutrition information, I recommend books:  1).Eat to Live by Dr Monico Hoar. 2).Prevent and Reverse Heart Disease by Dr Suzzette Righter. 3) Dr Katherina Right Book:  Program to Reverse Diabetes  Valerian root trial for sleep Garlic tablets to help with BP

## 2013-03-02 NOTE — Progress Notes (Signed)
SUBJECTIVE: CC: Chief Complaint  Patient presents with  . Follow-up    3 month     HPI: Saw ENT in [place] for the vertigo.has had 2 vestibular rehab therapy.  Doing good.  The increased dose of the lexapro worked well. Still has some insomnia.back pain sometime wakes her up.uses an advil.  Patient is here for follow up of elevated BP: SBP 130-140 denies Headache;deniesChest Pain;denies weakness;denies Shortness of Breath or Orthopnea;denies Visual changes;denies palpitations;denies cough;denies pedal edema;denies symptoms of TIA or stroke; admits to Compliance with medications. denies Problems with medications.  Past Medical History  Diagnosis Date  . Fibromyalgia     Questionable  . Anxiety   . Depression    Past Surgical History  Procedure Laterality Date  . Shoulder surgery      Left  . Shoulder surgery Left     x2 surgeries   History   Social History  . Marital Status: Married    Spouse Name: N/A    Number of Children: N/A  . Years of Education: N/A   Occupational History  . Works with Horses    Social History Main Topics  . Smoking status: Never Smoker   . Smokeless tobacco: Never Used  . Alcohol Use: 2.4 oz/week    4 Glasses of wine per week  . Drug Use: No  . Sexual Activity: Yes   Other Topics Concern  . Not on file   Social History Narrative  . No narrative on file   Family History  Problem Relation Age of Onset  . Cancer Father     Throat  . Hypertension Mother   . Hyperlipidemia Mother   . Cancer Mother    Current Outpatient Prescriptions on File Prior to Visit  Medication Sig Dispense Refill  . escitalopram (LEXAPRO) 20 MG tablet Take 1 tablet (20 mg total) by mouth daily.  30 tablet  5  . fluticasone (FLONASE) 50 MCG/ACT nasal spray Place 2 sprays into the nose daily.  16 g  2  . Levonorgestrel-Ethinyl Estradiol (AMETHIA,CAMRESE) 0.15-0.03 &0.01 MG tablet Take 1 tablet by mouth daily.  1 Package  4  . levonorgestrel-ethinyl estradiol  (ENPRESSE-28) tablet TAKE AS DIRECTED  28 tablet  1  . diazepam (VALIUM) 10 MG tablet Take 10 mg by mouth every 6 (six) hours as needed for anxiety.      . meclizine (ANTIVERT) 25 MG tablet TAKE 1 TABLET (25 MG TOTAL) BY MOUTH 3 (THREE) TIMES DAILY AS NEEDED.  30 tablet  0   No current facility-administered medications on file prior to visit.   Allergies  Allergen Reactions  . Penicillins     There is no immunization history on file for this patient. Prior to Admission medications   Medication Sig Start Date End Date Taking? Authorizing Provider  escitalopram (LEXAPRO) 20 MG tablet Take 1 tablet (20 mg total) by mouth daily. 01/10/13  Yes Ileana Ladd, MD  fluticasone (FLONASE) 50 MCG/ACT nasal spray Place 2 sprays into the nose daily. 02/05/13  Yes Ernestina Penna, MD  Levonorgestrel-Ethinyl Estradiol (AMETHIA,CAMRESE) 0.15-0.03 &0.01 MG tablet Take 1 tablet by mouth daily. 01/09/13  Yes Adam Phenix, MD  levonorgestrel-ethinyl estradiol [phone]) tablet TAKE AS DIRECTED 12/19/12  Yes Ileana Ladd, MD  traMADol (ULTRAM) 50 MG tablet TAKE 1 TABLET BY MOUTH EVERY 6 HOURS AS NEEDED 01/10/13  Yes Ileana Ladd, MD  diazepam (VALIUM) 10 MG tablet Take 10 mg by mouth every 6 (six) hours as needed for  anxiety.    Historical Provider, MD  meclizine (ANTIVERT) 25 MG tablet TAKE 1 TABLET (25 MG TOTAL) BY MOUTH 3 (THREE) TIMES DAILY AS NEEDED. 01/17/13   Ileana Ladd, MD     ROS: As above in the HPI. All other systems are stable or negative.  OBJECTIVE: APPEARANCE:  Patient in no acute distress.The patient appeared well nourished and normally developed. Acyanotic. Waist: VITAL SIGNS:BP 152/91  Pulse 82  Temp(Src) 98.6 F (37 C) (Oral)  Ht 5' 7.5" (1.715 m)  Wt 180 lb 6.4 oz (81.829 kg)  BMI 27.82 kg/m2  LMP 02/16/2013 WF BP 146/90  SKIN: warm and  Dry without overt rashes, tattoos and scars  HEAD and Neck: without JVD, Head and scalp: normal Eyes:No scleral icterus. Fundi  normal, eye movements normal. Ears: Auricle normal, canal normal, Tympanic membranes normal, insufflation normal. Nose: normal Throat: normal Neck & thyroid: normal  CHEST & LUNGS: Chest wall: normal Lungs: Clear  CVS: Reveals the PMI to be normally located. Regular rhythm, First and Second Heart sounds are normal,  absence of murmurs, rubs or gallops. Peripheral vasculature: Radial pulses: normal Dorsal pedis pulses: normal Posterior pulses: normal  ABDOMEN:  Appearance: normal Benign, no organomegaly, no masses, no Abdominal Aortic enlargement. No Guarding , no rebound. No Bruits. Bowel sounds: normal  RECTAL: N/A GU: N/A  EXTREMETIES: nonedematous.  MUSCULOSKELETAL:  Spine: normal Joints: intact  NEUROLOGIC: oriented to time,place and person; nonfocal. Strength is normal Sensory is normal Reflexes are normal Cranial Nerves are normal. Results for orders placed in visit on 08/09/12  VITAMIN D 25 HYDROXY      Result Value Range   Vit D, 25-Hydroxy 46  30 - 89 ng/mL  COMPLETE METABOLIC PANEL WITH GFR      Result Value Range   Sodium 135  135 - 145 mEq/L   Potassium 4.2  3.5 - 5.3 mEq/L   Chloride 101  96 - 112 mEq/L   CO2 25  19 - 32 mEq/L   Glucose, Bld 85  70 - 99 mg/dL   BUN 14  6 - 23 mg/dL   Creat 4.54  0.98 - 1.19 mg/dL   Total Bilirubin 0.6  0.3 - 1.2 mg/dL   Alkaline Phosphatase 17 (*) 39 - 117 U/L   AST 22  0 - 37 U/L   ALT 22  0 - 35 U/L   Total Protein 6.6  6.0 - 8.3 g/dL   Albumin 4.0  3.5 - 5.2 g/dL   Calcium 9.0  8.4 - 14.7 mg/dL   GFR, Est African American >89     GFR, Est Non African American >89    TSH      Result Value Range   TSH 1.386  0.350 - 4.500 uIU/mL  FERRITIN      Result Value Range   Ferritin 80  10 - 291 ng/mL  POCT CBC      Result Value Range   WBC 7.7  4.6 - 10.2 K/uL   Lymph, poc 1.6  0.6 - 3.4   POC LYMPH PERCENT 20.2  10 - 50 %L   MID (cbc)    0 - 0.9   POC MID %    0 - 12 %M   POC Granulocyte 5.6  2 - 6.9    Granulocyte percent 72.2  37 - 80 %G   RBC 3.8 (*) 4.04 - 5.48 M/uL   Hemoglobin 12.2  12.2 - 16.2 g/dL   HCT, POC 82.9 (*)  37.7 - 47.9 %   MCV 95.6  80 - 97 fL   MCH, POC 32.1 (*) 27 - 31.2 pg   MCHC 33.6  31.8 - 35.4 g/dL   RDW, POC 40.9     Platelet Count, POC 328.0  142 - 424 K/uL   MPV 7.6  0 - 99.8 fL    ASSESSMENT: Fibromyalgia - Plan: traMADol (ULTRAM) 50 MG tablet  Elevated BP - Plan: BMP8+EGFR  Weight gain  Back pain - Plan: traMADol (ULTRAM) 50 MG tablet  Benign paroxysmal positional vertigo  PLAN:  Orders Placed This Encounter  Procedures  . BMP8+EGFR   Meds ordered this encounter  Medications  . traMADol (ULTRAM) 50 MG tablet    Sig: TAKE 1 TABLET BY MOUTH EVERY 6 HOURS AS NEEDED    Dispense:  30 tablet    Refill:  0   Medications Discontinued During This Encounter  Medication Reason  . traMADol (ULTRAM) 50 MG tablet Reorder        Dr Woodroe Mode Recommendations  For nutrition information, I recommend books:  1).Eat to Live by Dr Monico Hoar. 2).Prevent and Reverse Heart Disease by Dr Suzzette Righter. 3) Dr Katherina Right Book:  Program to Reverse Diabetes  Valerian root trial for sleep Garlic tablets to help with BP  Also handout on DASH Diet.  Return in about 3 months (around 06/02/2013) for Recheck medical problems.  Barbaraann Avans P. [place] Charon, M.D.

## 2013-03-03 LAB — BMP8+EGFR
BUN/Creatinine Ratio: 20 (ref 9–23)
BUN: 14 mg/dL (ref 6–24)
CO2: 25 mmol/L (ref 18–29)
Calcium: 9.3 mg/dL (ref 8.7–10.2)
Chloride: 99 mmol/L (ref 97–108)
Creatinine, Ser: 0.7 mg/dL (ref 0.57–1.00)
GFR calc Af Amer: 119 mL/min/{1.73_m2} (ref >59)
GFR calc non Af Amer: 104 mL/min/{1.73_m2} (ref >59)
Glucose: 81 mg/dL (ref 65–99)
Potassium: 4.7 mmol/L (ref 3.5–5.2)
Sodium: 136 mmol/L (ref 134–144)

## 2013-03-04 NOTE — Progress Notes (Signed)
Quick Note:  Call patient. Labs normal. No change in plan. ______ 

## 2013-04-02 ENCOUNTER — Telehealth: Payer: Self-pay | Admitting: Family Medicine

## 2013-04-03 ENCOUNTER — Other Ambulatory Visit: Payer: Self-pay | Admitting: Family Medicine

## 2013-04-03 DIAGNOSIS — M797 Fibromyalgia: Secondary | ICD-10-CM

## 2013-04-03 DIAGNOSIS — M549 Dorsalgia, unspecified: Secondary | ICD-10-CM

## 2013-04-03 MED ORDER — TRAMADOL HCL 50 MG PO TABS: ORAL_TABLET | ORAL | Status: DC

## 2013-04-03 NOTE — Telephone Encounter (Signed)
Rx ready for pick up. 

## 2013-04-03 NOTE — Telephone Encounter (Signed)
Left message on pt cell phone that rx for tramadol ready for pick up.

## 2013-05-01 ENCOUNTER — Other Ambulatory Visit: Payer: Self-pay | Admitting: Family Medicine

## 2013-05-04 NOTE — Telephone Encounter (Signed)
Last seen 03/02/13  [facility]  Saw OB/GYN 9/14

## 2013-05-04 NOTE — Telephone Encounter (Signed)
Call patient : Prescription refilled & sent to pharmacy in [facility]. 

## 2013-05-11 ENCOUNTER — Telehealth: Payer: Self-pay | Admitting: Family Medicine

## 2013-05-11 ENCOUNTER — Other Ambulatory Visit: Payer: Self-pay | Admitting: Family Medicine

## 2013-05-11 DIAGNOSIS — M797 Fibromyalgia: Secondary | ICD-10-CM

## 2013-05-11 DIAGNOSIS — M549 Dorsalgia, unspecified: Secondary | ICD-10-CM

## 2013-05-11 MED ORDER — TRAMADOL HCL 50 MG PO TABS: ORAL_TABLET | ORAL | Status: DC

## 2013-06-05 ENCOUNTER — Encounter: Payer: Self-pay | Admitting: Family Medicine

## 2013-06-05 ENCOUNTER — Ambulatory Visit (INDEPENDENT_AMBULATORY_CARE_PROVIDER_SITE_OTHER): Payer: Federal, State, Local not specified - PPO | Admitting: Family Medicine

## 2013-06-05 VITALS — BP 130/84 | HR 87 | Temp 99.1°F | Ht 67.75 in | Wt 183.2 lb

## 2013-06-05 DIAGNOSIS — M797 Fibromyalgia: Secondary | ICD-10-CM

## 2013-06-05 DIAGNOSIS — R030 Elevated blood-pressure reading, without diagnosis of hypertension: Secondary | ICD-10-CM

## 2013-06-05 DIAGNOSIS — F329 Major depressive disorder, single episode, unspecified: Secondary | ICD-10-CM

## 2013-06-05 DIAGNOSIS — M549 Dorsalgia, unspecified: Secondary | ICD-10-CM

## 2013-06-05 DIAGNOSIS — F32A Depression, unspecified: Secondary | ICD-10-CM

## 2013-06-05 DIAGNOSIS — F3289 Other specified depressive episodes: Secondary | ICD-10-CM

## 2013-06-05 DIAGNOSIS — IMO0001 Reserved for inherently not codable concepts without codable children: Secondary | ICD-10-CM

## 2013-06-05 DIAGNOSIS — R635 Abnormal weight gain: Secondary | ICD-10-CM

## 2013-06-05 DIAGNOSIS — H8110 Benign paroxysmal vertigo, unspecified ear: Secondary | ICD-10-CM

## 2013-06-05 MED ORDER — ESCITALOPRAM OXALATE 20 MG PO TABS: 20.0000 mg | ORAL_TABLET | Freq: Every day | ORAL | Status: DC

## 2013-06-05 MED ORDER — TRAMADOL HCL 50 MG PO TABS: ORAL_TABLET | ORAL | Status: DC

## 2013-06-05 NOTE — Progress Notes (Signed)
Patient ID: Rita Barry, female   DOB: [birth year], 48 y.o.   MRN: [id number] SUBJECTIVE: CC: Chief Complaint  Patient presents with  . Follow-up    3 MONTH FOLLOW UP     HPI:  Patient is here for follow up of elevated BP: Changed her diet and her BP dropped to normal ranges. See BP readings in the MEDIA Module.  denies Headache;deniesChest Pain;denies weakness;denies Shortness of Breath or Orthopnea;denies Visual changes;denies palpitations;denies cough;denies pedal edema;denies symptoms of TIA or stroke; admits to Compliance with medications. denies Problems with medications.  Fibromyalgia occasionally flare up occasionally uses the Tramadol. Depression stable and doing well. Feeling better now.    Past Medical History  Diagnosis Date  . Fibromyalgia     Questionable  . Anxiety   . Depression    Past Surgical History  Procedure Laterality Date  . Shoulder surgery      Left  . Shoulder surgery Left     x2 surgeries   History   Social History  . Marital Status: Married    Spouse Name: N/A    Number of Children: N/A  . Years of Education: N/A   Occupational History  . Works with Horses    Social History Main Topics  . Smoking status: Never Smoker   . Smokeless tobacco: Never Used  . Alcohol Use: 2.4 oz/week    4 Glasses of wine per week  . Drug Use: No  . Sexual Activity: Yes   Other Topics Concern  . Not on file   Social History Narrative  . No narrative on file   Family History  Problem Relation Age of Onset  . Cancer Father     Throat  . Hypertension Mother   . Hyperlipidemia Mother   . Cancer Mother    Current Outpatient Prescriptions on File Prior to Visit  Medication Sig Dispense Refill  . fluticasone (FLONASE) 50 MCG/ACT nasal spray Place 2 sprays into the nose daily.  16 g  2  . Levonorgestrel-Ethinyl Estradiol (AMETHIA,CAMRESE) 0.15-0.03 &0.01 MG tablet Take 1 tablet by mouth daily.  1 Package  4  . levonorgestrel-ethinyl estradiol  (ENPRESSE,TRIVORA) tablet Take 1 po qd  28 tablet  5  . diazepam (VALIUM) 10 MG tablet Take 10 mg by mouth every 6 (six) hours as needed for anxiety.      . meclizine (ANTIVERT) 25 MG tablet TAKE 1 TABLET (25 MG TOTAL) BY MOUTH 3 (THREE) TIMES DAILY AS NEEDED.  30 tablet  0   No current facility-administered medications on file prior to visit.   Allergies  Allergen Reactions  . Penicillins    Immunization History  Administered Date(s) Administered  . Influenza-Unspecified 01/31/2013   Prior to Admission medications   Medication Sig Start Date End Date Taking? Authorizing Provider  escitalopram (LEXAPRO) 20 MG tablet Take 1 tablet (20 mg total) by mouth daily. 01/10/13  Yes Vernie Shanks, MD  fluticasone (FLONASE) 50 MCG/ACT nasal spray Place 2 sprays into the nose daily. 02/05/13  Yes Chipper Herb, MD  Levonorgestrel-Ethinyl Estradiol (AMETHIA,CAMRESE) 0.15-0.03 &0.01 MG tablet Take 1 tablet by mouth daily. 01/09/13  Yes Woodroe Mode, MD  levonorgestrel-ethinyl estradiol Glory Rosebush) tablet Take 1 po qd 05/01/13  Yes Vernie Shanks, MD  traMADol (ULTRAM) 50 MG tablet TAKE 1 TABLET BY MOUTH EVERY 6 HOURS AS NEEDED 05/11/13  Yes Lysbeth Penner, FNP  diazepam (VALIUM) 10 MG tablet Take 10 mg by mouth every 6 (six) hours as needed for  anxiety.    Historical Provider, MD  meclizine (ANTIVERT) 25 MG tablet TAKE 1 TABLET (25 MG TOTAL) BY MOUTH 3 (THREE) TIMES DAILY AS NEEDED. 01/17/13   Vernie Shanks, MD     ROS: As above in the HPI. All other systems are stable or negative.  OBJECTIVE: APPEARANCE:  Patient in no acute distress.The patient appeared well nourished and normally developed. Acyanotic. Waist: VITAL SIGNS:BP 130/84  Pulse 87  Temp(Src) 99.1 F (37.3 C) (Oral)  Ht 5' 7.75" (1.721 m)  Wt 183 lb 3.2 oz (83.099 kg)  BMI 28.06 kg/m2 WF overweight  SKIN: warm and  Dry without overt rashes, tattoos and scars  HEAD and Neck: without JVD, Head and scalp:  normal Eyes:No scleral icterus. Fundi normal, eye movements normal. Ears: Auricle normal, canal normal, Tympanic membranes normal, insufflation normal. Nose: normal Throat: normal Neck & thyroid: normal  CHEST & LUNGS: Chest wall: normal Lungs: Clear  CVS: Reveals the PMI to be normally located. Regular rhythm, First and Second Heart sounds are normal,  absence of murmurs, rubs or gallops. Peripheral vasculature: Radial pulses: normal Dorsal pedis pulses: normal Posterior pulses: normal  ABDOMEN:  Appearance: normal Benign, no organomegaly, no masses, no Abdominal Aortic enlargement. No Guarding , no rebound. No Bruits. Bowel sounds: normal  RECTAL: N/A GU: N/A  EXTREMETIES: nonedematous.  MUSCULOSKELETAL:  Spine: normal Joints: intact  NEUROLOGIC: oriented to time,place and person; nonfocal. Strength is normal Sensory is normal Reflexes are normal Cranial Nerves are normal. Results for orders placed in visit on 03/02/13  BMP8+EGFR      Result Value Range   Glucose 81  65 - 99 mg/dL   BUN 14  6 - 24 mg/dL   Creatinine, Ser 0.70  0.57 - 1.00 mg/dL   GFR calc non Af Amer 104  >59 mL/min/1.73   GFR calc Af Amer 119  >59 mL/min/1.73   BUN/Creatinine Ratio 20  9 - 23   Sodium 136  134 - 144 mmol/L   Potassium 4.7  3.5 - 5.2 mmol/L   Chloride 99  97 - 108 mmol/L   CO2 25  18 - 29 mmol/L   Calcium 9.3  8.7 - 10.2 mg/dL    ASSESSMENT:  Fibromyalgia - Plan: traMADol (ULTRAM) 50 MG tablet, escitalopram (LEXAPRO) 20 MG tablet  Elevated BP - Plan: CMP14+EGFR, NMR, lipoprofile  Depression - Plan: escitalopram (LEXAPRO) 20 MG tablet  Benign paroxysmal positional vertigo  Weight gain - Plan: NMR, lipoprofile  Back pain - Plan: traMADol (ULTRAM) 50 MG tablet BPs now in the normal range.  PLAN:      Dr Paula Libra Recommendations  For nutrition information, I recommend books:  1).Eat to Live by Dr Excell Seltzer. 2).Prevent and Reverse Heart Disease by Dr  Karl Luke. 3) Dr Janene Harvey Book:  Program to Reverse Diabetes  Exercise recommendations are:  If unable to walk, then the patient can exercise in a chair 3 times a day. By flapping arms like a bird gently and raising legs outwards to the front.  If ambulatory, the patient can go for walks for 30 minutes 3 times a week. Then increase the intensity and duration as tolerated.  Goal is to try to attain exercise frequency to 5 times a week.  If applicable: Best to perform resistance exercises (machines or weights) 2 days a week and cardio type exercises 3 days per week.  Orders Placed This Encounter  Procedures  . CMP14+EGFR  . NMR, lipoprofile   Meds  ordered this encounter  Medications  . traMADol (ULTRAM) 50 MG tablet    Sig: TAKE 1 TABLET BY MOUTH EVERY 6 HOURS AS NEEDED    Dispense:  45 tablet    Refill:  0    Order Specific Question:  Supervising Provider    Answer:  Chipper Herb [[id number]]  . escitalopram (LEXAPRO) 20 MG tablet    Sig: Take 1 tablet (20 mg total) by mouth daily.    Dispense:  30 tablet    Refill:  5   Medications Discontinued During This Encounter  Medication Reason  . traMADol (ULTRAM) 50 MG tablet Reorder  . escitalopram (LEXAPRO) 20 MG tablet Reorder   Return in about 3 months (around 09/02/2013) for Recheck medical problems.  Jacquline Terrill P. Jacelyn Grip, M.D.

## 2013-06-07 LAB — CMP14+EGFR
ALT: 18 IU/L (ref 0–32)
AST: 18 IU/L (ref 0–40)
Albumin/Globulin Ratio: 1.9 (ref 1.1–2.5)
Albumin: 4.6 g/dL (ref 3.5–5.5)
Alkaline Phosphatase: 18 IU/L — ABNORMAL LOW (ref 39–117)
BUN/Creatinine Ratio: 13 (ref 9–23)
BUN: 10 mg/dL (ref 6–24)
CO2: 23 mmol/L (ref 18–29)
Calcium: 10 mg/dL (ref 8.7–10.2)
Chloride: 99 mmol/L (ref 97–108)
Creatinine, Ser: 0.78 mg/dL (ref 0.57–1.00)
GFR calc Af Amer: 104 mL/min/{1.73_m2} (ref >59)
GFR calc non Af Amer: 90 mL/min/{1.73_m2} (ref >59)
Globulin, Total: 2.4 g/dL (ref 1.5–4.5)
Glucose: 85 mg/dL (ref 65–99)
Potassium: 5.1 mmol/L (ref 3.5–5.2)
Sodium: 138 mmol/L (ref 134–144)
Total Bilirubin: 0.4 mg/dL (ref 0.0–1.2)
Total Protein: 7 g/dL (ref 6.0–8.5)

## 2013-06-07 LAB — NMR, LIPOPROFILE
Cholesterol: 292 mg/dL — ABNORMAL HIGH (ref <200)
HDL Cholesterol by NMR: 64 mg/dL (ref >=40)
HDL Particle Number: 39 umol/L (ref >=30.5)
LDL Particle Number: 2988 nmol/L — ABNORMAL HIGH (ref <1000)
LDL Size: 21.7 nm (ref >20.5)
LDLC SERPL CALC-MCNC: 205 mg/dL — ABNORMAL HIGH (ref <100)
LP-IR Score: <25 (ref <=45)
Small LDL Particle Number: 829 nmol/L — ABNORMAL HIGH (ref <=527)
Triglycerides by NMR: 116 mg/dL (ref <150)

## 2013-06-07 NOTE — Progress Notes (Signed)
Quick Note:  Call Patient Labs that are abnormal: The total cholesterol is very high and the LDLc and LDLp is very high. however the HDLc good cholesterol is very high and offsets the bad cholesterol.   The rest are at goal  Recommendations: She does not need to be on a statin.  But she needs to be on a plant based diet to lower the bad cholesterol. No change in plans.    ______

## 2013-07-24 ENCOUNTER — Other Ambulatory Visit: Payer: Self-pay | Admitting: Family Medicine

## 2013-07-25 ENCOUNTER — Telehealth: Payer: Self-pay | Admitting: Family Medicine

## 2013-07-26 ENCOUNTER — Other Ambulatory Visit: Payer: Self-pay | Admitting: Family Medicine

## 2013-07-26 DIAGNOSIS — M797 Fibromyalgia: Secondary | ICD-10-CM

## 2013-07-26 DIAGNOSIS — M549 Dorsalgia, unspecified: Secondary | ICD-10-CM

## 2013-07-26 MED ORDER — TRAMADOL HCL 50 MG PO TABS: ORAL_TABLET | ORAL | Status: DC

## 2013-07-26 NOTE — Telephone Encounter (Signed)
Pt notified of rx for tramadol at front desk

## 2013-07-26 NOTE — Telephone Encounter (Signed)
Rx ready for pick up. 

## 2013-09-12 ENCOUNTER — Telehealth: Payer: Self-pay | Admitting: Family Medicine

## 2013-09-12 NOTE — Telephone Encounter (Signed)
Pt coming in for appt 5/28

## 2013-09-18 ENCOUNTER — Encounter: Payer: Self-pay | Admitting: Family

## 2013-09-18 ENCOUNTER — Ambulatory Visit (INDEPENDENT_AMBULATORY_CARE_PROVIDER_SITE_OTHER): Payer: Federal, State, Local not specified - PPO | Admitting: Family

## 2013-09-18 VITALS — BP 124/85 | HR 74 | Temp 97.4°F | Ht 67.75 in | Wt 180.0 lb

## 2013-09-18 DIAGNOSIS — F32A Depression, unspecified: Secondary | ICD-10-CM

## 2013-09-18 DIAGNOSIS — F411 Generalized anxiety disorder: Secondary | ICD-10-CM

## 2013-09-18 DIAGNOSIS — M549 Dorsalgia, unspecified: Secondary | ICD-10-CM

## 2013-09-18 DIAGNOSIS — M797 Fibromyalgia: Secondary | ICD-10-CM

## 2013-09-18 DIAGNOSIS — F329 Major depressive disorder, single episode, unspecified: Secondary | ICD-10-CM

## 2013-09-18 DIAGNOSIS — F3289 Other specified depressive episodes: Secondary | ICD-10-CM

## 2013-09-18 DIAGNOSIS — IMO0001 Reserved for inherently not codable concepts without codable children: Secondary | ICD-10-CM

## 2013-09-18 MED ORDER — TRAMADOL HCL 50 MG PO TABS: ORAL_TABLET | ORAL | Status: DC

## 2013-09-18 NOTE — Patient Instructions (Signed)

## 2013-09-18 NOTE — Progress Notes (Signed)
   Subjective:    Patient ID: Rita PatellaHeather P Marrazzo, female    DOB: 1965-07-01, 48 y.o.   MRN: [id number]  HPI Pt here for chronic follow up. She has fibromyalgia and depression/anxiety. Pt states the tramadol is working at this time. States she takes one daily every AM. She is currently taking lexapro daily for her depression/anxiety and that is controlled at this time. No other complaints or concerns at this time/   Review of Systems  Respiratory: Negative.   Cardiovascular: Negative.   Gastrointestinal: Negative.   Genitourinary: Negative.   Musculoskeletal: Negative.   Hematological: Negative.   Psychiatric/Behavioral: Negative.   All other systems reviewed and are negative.      Objective:   Physical Exam  Vitals reviewed. Constitutional: She is oriented to person, place, and time. She appears well-developed and well-nourished. No distress.  HENT:  Head: Normocephalic and atraumatic.  Right Ear: External ear normal.  Left Ear: External ear normal.  Mouth/Throat: Oropharynx is clear and moist.  Eyes: Pupils are equal, round, and reactive to light.  Neck: Normal range of motion. Neck supple. No thyromegaly present.  Cardiovascular: Normal rate, regular rhythm, normal heart sounds and intact distal pulses.   No murmur heard. Pulmonary/Chest: Effort normal and breath sounds normal. No respiratory distress. She has no wheezes.  Abdominal: Soft. Bowel sounds are normal. She exhibits no distension. There is no tenderness.  Musculoskeletal: Normal range of motion. She exhibits no edema and no tenderness.  Neurological: She is alert and oriented to person, place, and time. No cranial nerve deficit.  Skin: Skin is warm and dry.  Psychiatric: She has a normal mood and affect. Her behavior is normal. Judgment and thought content normal.     BP 124/85  Pulse 74  Temp(Src) 97.4 F (36.3 C) (Oral)  Ht 5' 7.75" (1.721 m)  Wt 180 lb (81.647 kg)  BMI 27.57 kg/m2      Assessment &  Plan:  1. Depression 2. Fibromyalgia - traMADol (ULTRAM) 50 MG tablet; TAKE 1 TABLET BY MOUTH EVERY 6 HOURS AS NEEDED  Dispense: 45 tablet; Refill: 0  3. GAD (generalized anxiety disorder) 4. Back pain - traMADol (ULTRAM) 50 MG tablet; TAKE 1 TABLET BY MOUTH EVERY 6 HOURS AS NEEDED  Dispense: 45 tablet; Refill: 0   Continue all meds Diet and exercise encouraged RTO 6 months   Jannifer Rodneyhristy Bohdan Macho, FNP

## 2013-09-27 ENCOUNTER — Ambulatory Visit: Payer: Federal, State, Local not specified - PPO | Admitting: Family

## 2013-10-07 ENCOUNTER — Other Ambulatory Visit: Payer: Self-pay | Admitting: Nurse Practitioner

## 2013-10-08 ENCOUNTER — Other Ambulatory Visit: Payer: Self-pay | Admitting: *Deleted

## 2013-10-08 MED ORDER — LEVONORG-ETH ESTRAD TRIPHASIC PO TABS: ORAL_TABLET | ORAL | Status: DC

## 2013-10-24 ENCOUNTER — Other Ambulatory Visit: Payer: Self-pay | Admitting: *Deleted

## 2013-10-24 MED ORDER — FLUTICASONE PROPIONATE 50 MCG/ACT NA SUSP: NASAL | Status: DC

## 2013-10-30 ENCOUNTER — Telehealth: Payer: Self-pay | Admitting: Family

## 2013-10-30 DIAGNOSIS — M797 Fibromyalgia: Secondary | ICD-10-CM

## 2013-10-31 MED ORDER — TRAMADOL HCL 50 MG PO TABS: ORAL_TABLET | ORAL | Status: DC

## 2013-10-31 NOTE — Telephone Encounter (Signed)
Patient aware left message  

## 2013-12-08 ENCOUNTER — Ambulatory Visit (INDEPENDENT_AMBULATORY_CARE_PROVIDER_SITE_OTHER): Payer: Federal, State, Local not specified - PPO | Admitting: Nurse Practitioner

## 2013-12-08 VITALS — BP 121/74 | HR 80 | Temp 98.2°F | Ht 67.75 in | Wt 181.8 lb

## 2013-12-08 DIAGNOSIS — L255 Unspecified contact dermatitis due to plants, except food: Secondary | ICD-10-CM

## 2013-12-08 MED ORDER — PREDNISONE 20 MG PO TABS: ORAL_TABLET | ORAL | Status: DC

## 2013-12-08 MED ORDER — METHYLPREDNISOLONE ACETATE 80 MG/ML IJ SUSP
80.0000 mg | Freq: Once | INTRAMUSCULAR | Status: AC
  Administered 2013-12-08: 80 mg via INTRAMUSCULAR

## 2013-12-08 NOTE — Patient Instructions (Signed)

## 2013-12-08 NOTE — Progress Notes (Signed)
   Subjective:    Patient ID: Rita Barry, female    DOB: Apr 24, 1966, 48 y.o.   MRN: [id number]  HPI Patient in c/o rash that started after doing yard work.- Rash is itchy    Review of Systems  Constitutional: Negative.   HENT: Negative.   Respiratory: Negative.   Cardiovascular: Negative.   Genitourinary: Negative.   Neurological: Negative.   Psychiatric/Behavioral: Negative.   All other systems reviewed and are negative.      Objective:   Physical Exam  Constitutional: She is oriented to person, place, and time. She appears well-developed and well-nourished.  Cardiovascular: Normal rate, regular rhythm and normal heart sounds.   Pulmonary/Chest: Effort normal and breath sounds normal.  Neurological: She is alert and oriented to person, place, and time.  Skin: Skin is warm.  Erythematous maculo papular lesiions with few vesicles mainly on bil legs  Psychiatric: She has a normal mood and affect. Her behavior is normal. Judgment and thought content normal.   BP 121/74  Pulse 80  Temp(Src) 98.2 F (36.8 C) (Oral)  Ht 5' 7.75" (1.721 m)  Wt 181 lb 12.8 oz (82.464 kg)  BMI 27.84 kg/m2        Assessment & Plan:   1. Contact dermatitis due to plant    Meds ordered this encounter  Medications  . methylPREDNISolone acetate (DEPO-MEDROL) injection 80 mg    Sig:   . predniSONE (DELTASONE) 20 MG tablet    Sig: 2 po qd X 5 days - start tomorrow    Dispense:  10 tablet    Refill:  0    Order Specific Question:  Supervising Provider    Answer:  Ernestina PennaMOORE, DONALD W [[id number]]   Avoid scratching Calamine lotion if helps Benadryl OTC for itching Cool compresses RTO prn  Mary-Margaret Daphine DeutscherMartin, FNP

## 2013-12-17 ENCOUNTER — Other Ambulatory Visit: Payer: Self-pay | Admitting: Family

## 2013-12-17 DIAGNOSIS — M797 Fibromyalgia: Secondary | ICD-10-CM

## 2013-12-17 MED ORDER — TRAMADOL HCL 50 MG PO TABS: ORAL_TABLET | ORAL | Status: DC

## 2013-12-17 NOTE — Telephone Encounter (Signed)
Patient aware rx ready to be picked up 

## 2013-12-18 ENCOUNTER — Other Ambulatory Visit: Payer: Self-pay | Admitting: Family Medicine

## 2013-12-19 ENCOUNTER — Other Ambulatory Visit: Payer: Self-pay | Admitting: *Deleted

## 2013-12-19 DIAGNOSIS — F329 Major depressive disorder, single episode, unspecified: Secondary | ICD-10-CM

## 2013-12-19 DIAGNOSIS — F32A Depression, unspecified: Secondary | ICD-10-CM

## 2013-12-19 DIAGNOSIS — M797 Fibromyalgia: Secondary | ICD-10-CM

## 2013-12-19 MED ORDER — ESCITALOPRAM OXALATE 20 MG PO TABS: 20.0000 mg | ORAL_TABLET | Freq: Every day | ORAL | Status: DC

## 2013-12-25 ENCOUNTER — Other Ambulatory Visit: Payer: Self-pay | Admitting: Nurse Practitioner

## 2013-12-28 NOTE — Telephone Encounter (Signed)
Last seen 12/08/13  MMM No PAP in EPIC

## 2014-01-23 ENCOUNTER — Other Ambulatory Visit: Payer: Self-pay | Admitting: Nurse Practitioner

## 2014-01-28 ENCOUNTER — Ambulatory Visit: Payer: Federal, State, Local not specified - PPO | Admitting: Obstetrics & Gynecology

## 2014-01-29 ENCOUNTER — Telehealth: Payer: Self-pay | Admitting: Family

## 2014-01-29 DIAGNOSIS — M797 Fibromyalgia: Secondary | ICD-10-CM

## 2014-01-29 MED ORDER — TRAMADOL HCL 50 MG PO TABS: ORAL_TABLET | ORAL | Status: DC

## 2014-01-29 NOTE — Telephone Encounter (Signed)
Script ready to be picked up at front desk with photo ID.

## 2014-02-18 ENCOUNTER — Other Ambulatory Visit: Payer: Self-pay | Admitting: Nurse Practitioner

## 2014-02-19 NOTE — Telephone Encounter (Signed)
No pap in Epic.

## 2014-03-01 ENCOUNTER — Other Ambulatory Visit: Payer: Self-pay | Admitting: Family

## 2014-03-01 DIAGNOSIS — M797 Fibromyalgia: Secondary | ICD-10-CM

## 2014-03-01 NOTE — Telephone Encounter (Signed)
Patient last seen in office on 12-08-13. Rx last filled on 01-29-14 for #45. Please advise

## 2014-03-04 ENCOUNTER — Telehealth: Payer: Self-pay | Admitting: *Deleted

## 2014-03-04 MED ORDER — TRAMADOL HCL 50 MG PO TABS: ORAL_TABLET | ORAL | Status: DC

## 2014-03-04 NOTE — Telephone Encounter (Signed)
rx ready for pickup 

## 2014-03-04 NOTE — Telephone Encounter (Signed)
Aware ,script ready. 

## 2014-03-15 ENCOUNTER — Other Ambulatory Visit: Payer: Self-pay | Admitting: Nurse Practitioner

## 2014-03-17 ENCOUNTER — Other Ambulatory Visit: Payer: Self-pay | Admitting: Nurse Practitioner

## 2014-03-27 ENCOUNTER — Other Ambulatory Visit: Payer: Self-pay | Admitting: Family

## 2014-03-27 DIAGNOSIS — M797 Fibromyalgia: Secondary | ICD-10-CM

## 2014-03-27 NOTE — Telephone Encounter (Signed)
Tramadol last written 03/04/14 #45 no refills, pt requesting another refill, please advise.

## 2014-03-29 MED ORDER — TRAMADOL HCL 50 MG PO TABS: ORAL_TABLET | ORAL | Status: DC

## 2014-03-29 NOTE — Telephone Encounter (Signed)
Tramadol rx ready for pick up  

## 2014-04-01 NOTE — Telephone Encounter (Signed)
Script for tramadol ready for pick up. Can not send in.

## 2014-04-08 ENCOUNTER — Encounter: Payer: Self-pay | Admitting: Family Medicine

## 2014-04-08 ENCOUNTER — Ambulatory Visit (INDEPENDENT_AMBULATORY_CARE_PROVIDER_SITE_OTHER): Payer: Federal, State, Local not specified - PPO

## 2014-04-08 ENCOUNTER — Ambulatory Visit (INDEPENDENT_AMBULATORY_CARE_PROVIDER_SITE_OTHER): Payer: Federal, State, Local not specified - PPO | Admitting: Family Medicine

## 2014-04-08 VITALS — BP 110/63 | HR 97 | Temp 97.0°F | Ht 67.0 in | Wt 175.0 lb

## 2014-04-08 DIAGNOSIS — R109 Unspecified abdominal pain: Secondary | ICD-10-CM

## 2014-04-08 LAB — POCT CBC
Granulocyte percent: 84.2 %G — AB (ref 37–80)
HCT, POC: 37.2 % — AB (ref 37.7–47.9)
Hemoglobin: 12.3 g/dL (ref 12.2–16.2)
Lymph, poc: 1.8 (ref 0.6–3.4)
MCH, POC: 31.8 pg — AB (ref 27–31.2)
MCHC: 33.1 g/dL (ref 31.8–35.4)
MCV: 96.3 fL (ref 80–97)
MPV: 8.4 fL (ref 0–99.8)
POC Granulocyte: 11.6 — AB (ref 2–6.9)
POC LYMPH PERCENT: 13 %L (ref 10–50)
Platelet Count, POC: 334 10*3/uL (ref 142–424)
RBC: 3.9 M/uL — AB (ref 4.04–5.48)
RDW, POC: 12.9 %
WBC: 13.8 10*3/uL — AB (ref 4.6–10.2)

## 2014-04-08 LAB — POCT URINALYSIS DIPSTICK
Bilirubin, UA: NEGATIVE
Glucose, UA: NEGATIVE
Ketones, UA: NEGATIVE
Nitrite, UA: NEGATIVE
Spec Grav, UA: >=1.03
Urobilinogen, UA: NEGATIVE
pH, UA: 5

## 2014-04-08 LAB — POCT UA - MICROSCOPIC ONLY
Casts, Ur, LPF, POC: NEGATIVE
Crystals, Ur, HPF, POC: NEGATIVE
Yeast, UA: NEGATIVE

## 2014-04-08 NOTE — Progress Notes (Signed)
Subjective:    Patient ID: Rita Barry, female    DOB: 1965/08/25, 48 y.o.   MRN: [id number]  HPI Patient is here for c/o abdominal pain and N/V for 2 days.  Today she is having a lot of pain in her abdomen.  She has been constipated.  She has lower back pain and fever.  She has been having chills.  She has been feeling washed out.  Review of Systems  Constitutional: Negative for fever.  HENT: Negative for ear pain.   Eyes: Negative for discharge.  Respiratory: Negative for cough.   Cardiovascular: Negative for chest pain.  Gastrointestinal: Positive for abdominal pain and abdominal distention.  Endocrine: Negative for polyuria.  Genitourinary: Negative for difficulty urinating.  Musculoskeletal: Negative for gait problem and neck pain.  Skin: Negative for color change and rash.  Neurological: Negative for speech difficulty and headaches.  Psychiatric/Behavioral: Negative for agitation.       Objective:    BP 110/63 mmHg  Pulse 97  Temp(Src) 97 F (36.1 C) (Oral)  Ht 5\' 7"  (1.702 m)  Wt 175 lb (79.379 kg)  BMI 27.40 kg/m2 Physical Exam  Constitutional: She is oriented to person, place, and time. She appears well-developed and well-nourished.  HENT:  Head: Normocephalic and atraumatic.  Mouth/Throat: Oropharynx is clear and moist.  Eyes: Pupils are equal, round, and reactive to light.  Neck: Normal range of motion. Neck supple.  Cardiovascular: Normal rate and regular rhythm.   No murmur heard. Pulmonary/Chest: Effort normal and breath sounds normal.  Abdominal: Bowel sounds are normal. She exhibits distension. There is tenderness. There is guarding.  Neurological: She is alert and oriented to person, place, and time.  Skin: Skin is warm and dry.  Psychiatric: She has a normal mood and affect.    KUB - Diffuse constipation Prelimnary reading by Angeline SlimWilliam Hawley Pavia,FNP  Results for orders placed or performed in visit on 04/08/14  POCT CBC  Result Value Ref Range     WBC 13.8 (A) 4.6 - 10.2 K/uL   Lymph, poc 1.8 0.6 - 3.4   POC LYMPH PERCENT 13.0 10 - 50 %L   POC Granulocyte 11.6 (A) 2 - 6.9   Granulocyte percent 84.2 (A) 37 - 80 %G   RBC 3.9 (A) 4.04 - 5.48 M/uL   Hemoglobin 12.3 12.2 - 16.2 g/dL   HCT, POC 46.937.2 (A) 62.937.7 - 47.9 %   MCV 96.3 80 - 97 fL   MCH, POC 31.8 (A) 27 - 31.2 pg   MCHC 33.1 31.8 - 35.4 g/dL   RDW, POC 52.812.9 %   Platelet Count, POC 334.0 142 - 424 K/uL   MPV 8.4 0 - 99.8 fL  POCT UA - Microscopic Only  Result Value Ref Range   WBC, Ur, HPF, POC 10-15    RBC, urine, microscopic tntc    Bacteria, U Microscopic occ    Mucus, UA mod    Epithelial cells, urine per micros occ    Crystals, Ur, HPF, POC neg    Casts, Ur, LPF, POC neg    Yeast, UA neg   POCT urinalysis dipstick  Result Value Ref Range   Color, UA straw    Clarity, UA cloudy    Glucose, UA neg    Bilirubin, UA neg    Ketones, UA neg    Spec Grav, UA >=1.030    Blood, UA large    pH, UA 5.0    Protein, UA 3+++    Urobilinogen,  UA negative    Nitrite, UA neg    Leukocytes, UA Trace         Assessment & Plan:     ICD-9-CM ICD-10-CM   1. Abdominal pain, unspecified abdominal location 789.00 R10.9 DG Abd 1 View     POCT CBC     POCT UA - Microscopic Only     POCT urinalysis dipstick     Urine culture   Discussed with patient that since she has guarding and leukocytosis cannot r/o appendicitis so recommend going to the ED.  [facility]  No Follow-up on file.  Deatra CanterWilliam J Oviya Ammar FNP

## 2014-04-09 LAB — URINE CULTURE

## 2014-04-11 ENCOUNTER — Other Ambulatory Visit: Payer: Self-pay | Admitting: Nurse Practitioner

## 2014-04-11 NOTE — Telephone Encounter (Signed)
Last seen 04/08/14 [facility] [place]  No PAP in EPIC

## 2014-04-11 NOTE — Telephone Encounter (Signed)
Please review and advise.

## 2014-04-13 ENCOUNTER — Other Ambulatory Visit: Payer: Self-pay | Admitting: Nurse Practitioner

## 2014-04-23 ENCOUNTER — Telehealth: Payer: Self-pay | Admitting: *Deleted

## 2014-04-23 ENCOUNTER — Telehealth: Payer: Self-pay | Admitting: Nurse Practitioner

## 2014-04-23 DIAGNOSIS — M797 Fibromyalgia: Secondary | ICD-10-CM

## 2014-04-23 MED ORDER — TRAMADOL HCL 50 MG PO TABS: ORAL_TABLET | ORAL | Status: DC

## 2014-04-23 NOTE — Telephone Encounter (Signed)
Tramadol rx ready for pick up  

## 2014-04-23 NOTE — Telephone Encounter (Signed)
Tramadol  script ready. 

## 2014-04-29 ENCOUNTER — Other Ambulatory Visit: Payer: Self-pay | Admitting: Nurse Practitioner

## 2014-05-11 ENCOUNTER — Other Ambulatory Visit: Payer: Self-pay | Admitting: Family Medicine

## 2014-05-14 ENCOUNTER — Telehealth: Payer: Self-pay | Admitting: *Deleted

## 2014-05-14 ENCOUNTER — Telehealth: Payer: Self-pay | Admitting: Nurse Practitioner

## 2014-05-14 DIAGNOSIS — M797 Fibromyalgia: Secondary | ICD-10-CM

## 2014-05-14 MED ORDER — TRAMADOL HCL 50 MG PO TABS: ORAL_TABLET | ORAL | Status: DC

## 2014-05-14 NOTE — Telephone Encounter (Signed)
Ultram rx ready for pick up no more refills without being seen  

## 2014-05-14 NOTE — Telephone Encounter (Signed)
LM,  Tramadol  Script ready.

## 2014-06-03 ENCOUNTER — Other Ambulatory Visit: Payer: Self-pay | Admitting: Nurse Practitioner

## 2014-06-03 DIAGNOSIS — M797 Fibromyalgia: Secondary | ICD-10-CM

## 2014-06-03 MED ORDER — TRAMADOL HCL 50 MG PO TABS: ORAL_TABLET | ORAL | Status: DC

## 2014-06-03 NOTE — Telephone Encounter (Signed)
Pt notified RX is ready for pick up RX to the front 

## 2014-06-03 NOTE — Telephone Encounter (Signed)
Ultram rx ready for pick up  

## 2014-06-27 ENCOUNTER — Telehealth: Payer: Self-pay | Admitting: Nurse Practitioner

## 2014-06-27 DIAGNOSIS — M797 Fibromyalgia: Secondary | ICD-10-CM

## 2014-06-27 MED ORDER — TRAMADOL HCL 50 MG PO TABS: ORAL_TABLET | ORAL | Status: DC

## 2014-06-27 NOTE — Telephone Encounter (Signed)
Rita Barry called [facility] to pick up refill

## 2014-06-27 NOTE — Telephone Encounter (Signed)
Ultram rx ready for pick up  

## 2014-07-09 ENCOUNTER — Other Ambulatory Visit: Payer: Self-pay | Admitting: Family Medicine

## 2014-07-18 ENCOUNTER — Other Ambulatory Visit: Payer: Self-pay | Admitting: Family

## 2014-07-18 MED ORDER — LEVONORG-ETH ESTRAD TRIPHASIC PO TABS: 1.0000 | ORAL_TABLET | Freq: Every day | ORAL | Status: DC

## 2014-07-18 NOTE — Telephone Encounter (Signed)
Done

## 2014-08-06 ENCOUNTER — Other Ambulatory Visit: Payer: Self-pay | Admitting: Nurse Practitioner

## 2014-08-06 ENCOUNTER — Other Ambulatory Visit: Payer: Self-pay | Admitting: Family

## 2014-08-06 DIAGNOSIS — M797 Fibromyalgia: Secondary | ICD-10-CM

## 2014-08-07 MED ORDER — TRAMADOL HCL 50 MG PO TABS: ORAL_TABLET | ORAL | Status: DC

## 2014-08-07 NOTE — Telephone Encounter (Signed)
Patient aware rx up front to be picked up 

## 2014-08-20 ENCOUNTER — Ambulatory Visit (INDEPENDENT_AMBULATORY_CARE_PROVIDER_SITE_OTHER): Payer: Federal, State, Local not specified - PPO | Admitting: Family

## 2014-08-20 ENCOUNTER — Encounter: Payer: Self-pay | Admitting: Family

## 2014-08-20 VITALS — BP 129/82 | HR 71 | Temp 97.3°F | Ht 67.0 in | Wt 172.2 lb

## 2014-08-20 DIAGNOSIS — F329 Major depressive disorder, single episode, unspecified: Secondary | ICD-10-CM

## 2014-08-20 DIAGNOSIS — Z01419 Encounter for gynecological examination (general) (routine) without abnormal findings: Secondary | ICD-10-CM

## 2014-08-20 DIAGNOSIS — Z3041 Encounter for surveillance of contraceptive pills: Secondary | ICD-10-CM

## 2014-08-20 DIAGNOSIS — F32A Depression, unspecified: Secondary | ICD-10-CM

## 2014-08-20 DIAGNOSIS — F411 Generalized anxiety disorder: Secondary | ICD-10-CM

## 2014-08-20 DIAGNOSIS — Z0000 Encounter for general adult medical examination without abnormal findings: Secondary | ICD-10-CM

## 2014-08-20 DIAGNOSIS — M797 Fibromyalgia: Secondary | ICD-10-CM

## 2014-08-20 LAB — POCT UA - MICROSCOPIC ONLY
Bacteria, U Microscopic: NEGATIVE
Casts, Ur, LPF, POC: NEGATIVE
Crystals, Ur, HPF, POC: NEGATIVE
WBC, Ur, HPF, POC: NEGATIVE
Yeast, UA: NEGATIVE

## 2014-08-20 LAB — POCT URINALYSIS DIPSTICK
Bilirubin, UA: NEGATIVE
Glucose, UA: NEGATIVE
Ketones, UA: NEGATIVE
Leukocytes, UA: NEGATIVE
Nitrite, UA: NEGATIVE
Spec Grav, UA: 1.02
Urobilinogen, UA: NEGATIVE
pH, UA: 6

## 2014-08-20 MED ORDER — ESCITALOPRAM OXALATE 20 MG PO TABS: ORAL_TABLET | ORAL | Status: DC

## 2014-08-20 MED ORDER — TRAMADOL HCL 50 MG PO TABS: ORAL_TABLET | ORAL | Status: DC

## 2014-08-20 MED ORDER — LEVONORG-ETH ESTRAD TRIPHASIC PO TABS: 1.0000 | ORAL_TABLET | Freq: Every day | ORAL | Status: DC

## 2014-08-20 MED ORDER — FLUTICASONE PROPIONATE 50 MCG/ACT NA SUSP: NASAL | Status: DC

## 2014-08-20 NOTE — Addendum Note (Signed)
Addended by: Prescott GumLAND, Lakin Rhine M on: 08/20/2014 04:26 PM   Modules accepted: Kipp BroodSmartSet

## 2014-08-20 NOTE — Patient Instructions (Addendum)
Health Maintenance Adopting a healthy lifestyle and getting preventive care can go a long way to promote health and wellness. Talk with your health care provider about what schedule of regular examinations is right for you. This is a good chance for you to check in with your provider about disease prevention and staying healthy. In between checkups, there are plenty of things you can do on your own. Experts have done a lot of research about which lifestyle changes and preventive measures are most likely to keep you healthy. Ask your health care provider for more information. WEIGHT AND DIET  Eat a healthy diet  Be sure to include plenty of vegetables, fruits, low-fat dairy products, and lean protein.  Do not eat a lot of foods high in solid fats, added sugars, or salt.  Get regular exercise. This is one of the most important things you can do for your health.  Most adults should exercise for at least 150 minutes each week. The exercise should increase your heart rate and make you sweat (moderate-intensity exercise).  Most adults should also do strengthening exercises at least twice a week. This is in addition to the moderate-intensity exercise.  Maintain a healthy weight  Body mass index (BMI) is a measurement that can be used to identify possible weight problems. It estimates body fat based on height and weight. Your health care provider can help determine your BMI and help you achieve or maintain a healthy weight.  For females 86 years of age and older:   A BMI below 18.5 is considered underweight.  A BMI of 18.5 to 24.9 is normal.  A BMI of 25 to 29.9 is considered overweight.  A BMI of 30 and above is considered obese.  Watch levels of cholesterol and blood lipids  You should start having your blood tested for lipids and cholesterol at 50 years of age, then have this test every 5 years.  You may need to have your cholesterol levels checked more often if:  Your lipid or  cholesterol levels are high.  You are older than 50 years of age.  You are at high risk for heart disease.  CANCER SCREENING   Lung Cancer  Lung cancer screening is recommended for adults 18-40 years old who are at high risk for lung cancer because of a history of smoking.  A yearly low-dose CT scan of the lungs is recommended for people who:  Currently smoke.  Have quit within the past 15 years.  Have at least a 30-pack-year history of smoking. A pack year is smoking an average of one pack of cigarettes a day for 1 year.  Yearly screening should continue until it has been 15 years since you quit.  Yearly screening should stop if you develop a health problem that would prevent you from having lung cancer treatment.  Breast Cancer  Practice breast self-awareness. This means understanding how your breasts normally appear and feel.  It also means doing regular breast self-exams. Let your health care provider know about any changes, no matter how small.  If you are in your 20s or 30s, you should have a clinical breast exam (CBE) by a health care provider every 1-3 years as part of a regular health exam.  If you are 80 or older, have a CBE every year. Also consider having a breast X-ray (mammogram) every year.  If you have a family history of breast cancer, talk to your health care provider about genetic screening.  If you are  at high risk for breast cancer, talk to your health care provider about having an MRI and a mammogram every year.  Breast cancer gene (BRCA) assessment is recommended for women who have family members with BRCA-related cancers. BRCA-related cancers include:  Breast.  Ovarian.  Tubal.  Peritoneal cancers.  Results of the assessment will determine the need for genetic counseling and BRCA1 and BRCA2 testing. Cervical Cancer Routine pelvic examinations to screen for cervical cancer are no longer recommended for nonpregnant women who are considered low  risk for cancer of the pelvic organs (ovaries, uterus, and vagina) and who do not have symptoms. A pelvic examination may be necessary if you have symptoms including those associated with pelvic infections. Ask your health care provider if a screening pelvic exam is right for you.   The Pap test is the screening test for cervical cancer for women who are considered at risk.  If you had a hysterectomy for a problem that was not cancer or a condition that could lead to cancer, then you no longer need Pap tests.  If you are older than 65 years, and you have had normal Pap tests for the past 10 years, you no longer need to have Pap tests.  If you have had past treatment for cervical cancer or a condition that could lead to cancer, you need Pap tests and screening for cancer for at least 20 years after your treatment.  If you no longer get a Pap test, assess your risk factors if they change (such as having a new sexual partner). This can affect whether you should start being screened again.  Some women have medical problems that increase their chance of getting cervical cancer. If this is the case for you, your health care provider may recommend more frequent screening and Pap tests.  The human papillomavirus (HPV) test is another test that may be used for cervical cancer screening. The HPV test looks for the virus that can cause cell changes in the cervix. The cells collected during the Pap test can be tested for HPV.  The HPV test can be used to screen women 30 years of age and older. Getting tested for HPV can extend the interval between normal Pap tests from three to five years.  An HPV test also should be used to screen women of any age who have unclear Pap test results.  After 50 years of age, women should have HPV testing as often as Pap tests.  Colorectal Cancer  This type of cancer can be detected and often prevented.  Routine colorectal cancer screening usually begins at 50 years of  age and continues through 50 years of age.  Your health care provider may recommend screening at an earlier age if you have risk factors for colon cancer.  Your health care provider may also recommend using home test kits to check for hidden blood in the stool.  A small camera at the end of a tube can be used to examine your colon directly (sigmoidoscopy or colonoscopy). This is done to check for the earliest forms of colorectal cancer.  Routine screening usually begins at age 50.  Direct examination of the colon should be repeated every 5-10 years through 50 years of age. However, you may need to be screened more often if early forms of precancerous polyps or small growths are found. Skin Cancer  Check your skin from head to toe regularly.  Tell your health care provider about any new moles or changes in   moles, especially if there is a change in a mole's shape or color.  Also tell your health care provider if you have a mole that is larger than the size of a pencil eraser.  Always use sunscreen. Apply sunscreen liberally and repeatedly throughout the day.  Protect yourself by wearing long sleeves, pants, a wide-brimmed hat, and sunglasses whenever you are outside. HEART DISEASE, DIABETES, AND HIGH BLOOD PRESSURE   Have your blood pressure checked at least every 1-2 years. High blood pressure causes heart disease and increases the risk of stroke.  If you are between 75 years and 42 years old, ask your health care provider if you should take aspirin to prevent strokes.  Have regular diabetes screenings. This involves taking a blood sample to check your fasting blood sugar level.  If you are at a normal weight and have a low risk for diabetes, have this test once every three years after 50 years of age.  If you are overweight and have a high risk for diabetes, consider being tested at a younger age or more often. PREVENTING INFECTION  Hepatitis B  If you have a higher risk for  hepatitis B, you should be screened for this virus. You are considered at high risk for hepatitis B if:  You were born in a country where hepatitis B is common. Ask your health care provider which countries are considered high risk.  Your parents were born in a high-risk country, and you have not been immunized against hepatitis B (hepatitis B vaccine).  You have HIV or AIDS.  You use needles to inject street drugs.  You live with someone who has hepatitis B.  You have had sex with someone who has hepatitis B.  You get hemodialysis treatment.  You take certain medicines for conditions, including cancer, organ transplantation, and autoimmune conditions. Hepatitis C  Blood testing is recommended for:  Everyone born from [birth year] through 1966.  Anyone with known risk factors for hepatitis C. Sexually transmitted infections (STIs)  You should be screened for sexually transmitted infections (STIs) including gonorrhea and chlamydia if:  You are sexually active and are younger than 50 years of age.  You are older than 50 years of age and your health care provider tells you that you are at risk for this type of infection.  Your sexual activity has changed since you were last screened and you are at an increased risk for chlamydia or gonorrhea. Ask your health care provider if you are at risk.  If you do not have HIV, but are at risk, it may be recommended that you take a prescription medicine daily to prevent HIV infection. This is called pre-exposure prophylaxis (PrEP). You are considered at risk if:  You are sexually active and do not regularly use condoms or know the HIV status of your partner(s).  You take drugs by injection.  You are sexually active with a partner who has HIV. Talk with your health care provider about whether you are at high risk of being infected with HIV. If you choose to begin PrEP, you should first be tested for HIV. You should then be tested every 3 months for  as long as you are taking PrEP.  PREGNANCY   If you are premenopausal and you may become pregnant, ask your health care provider about preconception counseling.  If you may become pregnant, take 400 to 800 micrograms (mcg) of folic acid every day.  If you want to prevent pregnancy, talk to your  health care provider about birth control (contraception). OSTEOPOROSIS AND MENOPAUSE   Osteoporosis is a disease in which the bones lose minerals and strength with aging. This can result in serious bone fractures. Your risk for osteoporosis can be identified using a bone density scan.  If you are 34 years of age or older, or if you are at risk for osteoporosis and fractures, ask your health care provider if you should be screened.  Ask your health care provider whether you should take a calcium or vitamin D supplement to lower your risk for osteoporosis.  Menopause may have certain physical symptoms and risks.  Hormone replacement therapy may reduce some of these symptoms and risks. Talk to your health care provider about whether hormone replacement therapy is right for you.  HOME CARE INSTRUCTIONS   Schedule regular health, dental, and eye exams.  Stay current with your immunizations.   Do not use any tobacco products including cigarettes, chewing tobacco, or electronic cigarettes.  If you are pregnant, do not drink alcohol.  If you are breastfeeding, limit how much and how often you drink alcohol.  Limit alcohol intake to no more than 1 drink per day for nonpregnant women. One drink equals 12 ounces of beer, 5 ounces of wine, or 1 ounces of hard liquor.  Do not use street drugs.  Do not share needles.  Ask your health care provider for help if you need support or information about quitting drugs.  Tell your health care provider if you often feel depressed.  Tell your health care provider if you have ever been abused or do not feel safe at home. Document Released: 11/02/2010  Document Revised: 09/03/2013 Document Reviewed: 03/21/2013 [facility] Patient Information 2015 [place], [place]. This information is not intended to replace advice given to you by your health care provider. Make sure you discuss any questions you have with your health care provider. Allergic Rhinitis Allergic rhinitis is when the mucous membranes in the nose respond to allergens. Allergens are particles in the air that cause your body to have an allergic reaction. This causes you to release allergic antibodies. Through a chain of events, these eventually cause you to release histamine into the blood stream. Although meant to protect the body, it is this release of histamine that causes your discomfort, such as frequent sneezing, congestion, and an itchy, runny nose.  CAUSES  Seasonal allergic rhinitis (hay fever) is caused by pollen allergens that may come from grasses, trees, and weeds. Year-round allergic rhinitis (perennial allergic rhinitis) is caused by allergens such as house dust mites, pet dander, and mold spores.  SYMPTOMS   Nasal stuffiness (congestion).  Itchy, runny nose with sneezing and tearing of the eyes. DIAGNOSIS  Your health care provider can help you determine the allergen or allergens that trigger your symptoms. If you and your health care provider are unable to determine the allergen, skin or blood testing may be used. TREATMENT  Allergic rhinitis does not have a cure, but it can be controlled by:  Medicines and allergy shots (immunotherapy).  Avoiding the allergen. Hay fever may often be treated with antihistamines in pill or nasal spray forms. Antihistamines block the effects of histamine. There are over-the-counter medicines that may help with nasal congestion and swelling around the eyes. Check with your health care provider before taking or giving this medicine.  If avoiding the allergen or the medicine prescribed do not work, there are many new medicines your health care  provider can prescribe. Stronger medicine may  be used if initial measures are ineffective. Desensitizing injections can be used if medicine and avoidance does not work. Desensitization is when a patient is given ongoing shots until the body becomes less sensitive to the allergen. Make sure you follow up with your health care provider if problems continue. HOME CARE INSTRUCTIONS It is not possible to completely avoid allergens, but you can reduce your symptoms by taking steps to limit your exposure to them. It helps to know exactly what you are allergic to so that you can avoid your specific triggers. SEEK MEDICAL CARE IF:   You have a fever.  You develop a cough that does not stop easily (persistent).  You have shortness of breath.  You start wheezing.  Symptoms interfere with normal daily activities. Document Released: 01/12/2001 Document Revised: 04/24/2013 Document Reviewed: 12/25/2012 [facility] Patient Information 2015 [place], [place]. This information is not intended to replace advice given to you by your health care provider. Make sure you discuss any questions you have with your health care provider.

## 2014-08-20 NOTE — Progress Notes (Signed)
Subjective:    Patient ID: Rita Barry, female    DOB: Nov 03, 1965, 49 y.o.   MRN: [id number]   HPI Pt presents to the office today for CPE and pap. She has fibromyalgia and depression/anxiety. Pt states the tramadol is working at this time. States she takes one daily every AM. She is currently taking lexapro daily for her depression/anxiety and that is controlled at this time. No other complaints or concerns at this time. Pt denies any headache, palpitations, SOB, or edema at this time.    Review of Systems  Constitutional: Negative.   HENT: Negative.   Eyes: Negative.   Respiratory: Negative.  Negative for shortness of breath.   Cardiovascular: Negative.  Negative for palpitations.  Gastrointestinal: Negative.   Endocrine: Negative.   Genitourinary: Negative.   Musculoskeletal: Negative.   Neurological: Negative.  Negative for headaches.  Hematological: Negative.   Psychiatric/Behavioral: Negative.   All other systems reviewed and are negative.      Objective:   Physical Exam  Constitutional: She is oriented to person, place, and time. She appears well-developed and well-nourished. No distress.  HENT:  Head: Normocephalic and atraumatic.  Right Ear: External ear normal.  Left Ear: External ear normal.  Nose: Nose normal.  Mouth/Throat: Oropharynx is clear and moist.  Eyes: Pupils are equal, round, and reactive to light.  Neck: Normal range of motion. Neck supple. No thyromegaly present.  Cardiovascular: Normal rate, regular rhythm, normal heart sounds and intact distal pulses.   No murmur heard. Pulmonary/Chest: Effort normal and breath sounds normal. No respiratory distress. She has no wheezes. Right breast exhibits no inverted nipple, no mass, no nipple discharge, no skin change and no tenderness. Left breast exhibits no inverted nipple, no mass, no nipple discharge, no skin change and no tenderness. Breasts are symmetrical.  Abdominal: Soft. Bowel sounds are normal.  She exhibits no distension. There is no tenderness.  Genitourinary: Vagina normal.  Bimanual exam- no adnexal masses or tenderness, ovaries nonpalpable   Cervix parous and pink- No discharge   Musculoskeletal: Normal range of motion. She exhibits no edema or tenderness.  Neurological: She is alert and oriented to person, place, and time. She has normal reflexes. No cranial nerve deficit.  Skin: Skin is warm and dry.  Psychiatric: She has a normal mood and affect. Her behavior is normal. Judgment and thought content normal.  Vitals reviewed.    BP 129/82 mmHg  Pulse 71  Temp(Src) 97.3 F (36.3 C) (Oral)  Ht _0  (1.702 m)  Wt 172 lb 3.2 oz (78.109 kg)  BMI 26.96 kg/m2  LMP 07/16/2014     Assessment & Plan:  1. Encounter for routine gynecological examination - POCT urinalysis dipstick - POCT UA - Microscopic Only - CMP14+EGFR; Future - Pap IG w/ reflex to HPV when ASC-U  2. Depression - CMP14+EGFR; Future - escitalopram (LEXAPRO) 20 MG tablet; TAKE 1 TABLET (20 MG TOTAL) BY MOUTH DAILY.  Dispense: 90 tablet; Refill: 3  3. GAD (generalized anxiety disorder) - CMP14+EGFR; Future - escitalopram (LEXAPRO) 20 MG tablet; TAKE 1 TABLET (20 MG TOTAL) BY MOUTH DAILY.  Dispense: 90 tablet; Refill: 3  4. Annual physical exam - CMP14+EGFR; Future - Lipid panel; Future - Thyroid Panel With TSH; Future - Vit D  25 hydroxy (rtn osteoporosis monitoring); Future - Pap IG w/ reflex to HPV when ASC-U  5. Fibromyalgia - traMADol (ULTRAM) 50 MG tablet; TAKE 1 TABLET BY MOUTH EVERY 6 HOURS AS NEEDED  Dispense: 45  tablet; Refill: 2  6. Encounter for surveillance of contraceptive pills - levonorgestrel-ethinyl estradiol (ENPRESSE-28) tablet; Take 1 tablet by mouth daily.  Dispense: 28 tablet; Refill: 11   Continue all meds Labs pending Health Maintenance reviewed Diet and exercise encouraged RTO 1 year  [name], FNP

## 2014-08-22 ENCOUNTER — Other Ambulatory Visit (INDEPENDENT_AMBULATORY_CARE_PROVIDER_SITE_OTHER): Payer: Federal, State, Local not specified - PPO

## 2014-08-22 DIAGNOSIS — F329 Major depressive disorder, single episode, unspecified: Secondary | ICD-10-CM

## 2014-08-22 DIAGNOSIS — F32A Depression, unspecified: Secondary | ICD-10-CM

## 2014-08-22 DIAGNOSIS — F411 Generalized anxiety disorder: Secondary | ICD-10-CM

## 2014-08-22 DIAGNOSIS — Z0000 Encounter for general adult medical examination without abnormal findings: Secondary | ICD-10-CM

## 2014-08-22 DIAGNOSIS — Z01419 Encounter for gynecological examination (general) (routine) without abnormal findings: Secondary | ICD-10-CM

## 2014-08-22 LAB — PAP IG W/ RFLX HPV ASCU: PAP Smear Comment: 0

## 2014-08-22 NOTE — Progress Notes (Signed)
Lab only 

## 2014-08-23 ENCOUNTER — Other Ambulatory Visit: Payer: Self-pay | Admitting: Family

## 2014-08-23 DIAGNOSIS — E785 Hyperlipidemia, unspecified: Secondary | ICD-10-CM

## 2014-08-23 DIAGNOSIS — E559 Vitamin D deficiency, unspecified: Secondary | ICD-10-CM

## 2014-08-23 LAB — CMP14+EGFR
ALT: 15 IU/L (ref 0–32)
AST: 18 IU/L (ref 0–40)
Albumin/Globulin Ratio: 1.5 (ref 1.1–2.5)
Albumin: 4 g/dL (ref 3.5–5.5)
Alkaline Phosphatase: 17 IU/L — ABNORMAL LOW (ref 39–117)
BUN/Creatinine Ratio: 18 (ref 9–23)
BUN: 12 mg/dL (ref 6–24)
Bilirubin Total: 0.2 mg/dL (ref 0.0–1.2)
CO2: 24 mmol/L (ref 18–29)
Calcium: 9 mg/dL (ref 8.7–10.2)
Chloride: 98 mmol/L (ref 97–108)
Creatinine, Ser: 0.68 mg/dL (ref 0.57–1.00)
GFR calc Af Amer: 119 mL/min/{1.73_m2} (ref >59)
GFR calc non Af Amer: 103 mL/min/{1.73_m2} (ref >59)
Globulin, Total: 2.6 g/dL (ref 1.5–4.5)
Glucose: 96 mg/dL (ref 65–99)
Potassium: 4.9 mmol/L (ref 3.5–5.2)
Sodium: 135 mmol/L (ref 134–144)
Total Protein: 6.6 g/dL (ref 6.0–8.5)

## 2014-08-23 LAB — LIPID PANEL
Chol/HDL Ratio: 3.1 ratio units (ref 0.0–4.4)
Cholesterol, Total: 248 mg/dL — ABNORMAL HIGH (ref 100–199)
HDL: 81 mg/dL (ref >39)
LDL Calculated: 142 mg/dL — ABNORMAL HIGH (ref 0–99)
Triglycerides: 127 mg/dL (ref 0–149)
VLDL Cholesterol Cal: 25 mg/dL (ref 5–40)

## 2014-08-23 LAB — VITAMIN D 25 HYDROXY (VIT D DEFICIENCY, FRACTURES): Vit D, 25-Hydroxy: 20.6 ng/mL — ABNORMAL LOW (ref 30.0–100.0)

## 2014-08-23 LAB — THYROID PANEL WITH TSH
Free Thyroxine Index: 1.7 (ref 1.2–4.9)
T3 Uptake Ratio: 24 % (ref 24–39)
T4, Total: 6.9 ug/dL (ref 4.5–12.0)
TSH: 1.69 u[IU]/mL (ref 0.450–4.500)

## 2014-08-23 MED ORDER — SIMVASTATIN 40 MG PO TABS: 40.0000 mg | ORAL_TABLET | Freq: Every day | ORAL | Status: DC

## 2014-08-23 MED ORDER — VITAMIN D (ERGOCALCIFEROL) 1.25 MG (50000 UNIT) PO CAPS: 50000.0000 [IU] | ORAL_CAPSULE | ORAL | Status: DC

## 2014-10-01 ENCOUNTER — Other Ambulatory Visit: Payer: Self-pay | Admitting: Family

## 2014-10-01 DIAGNOSIS — M797 Fibromyalgia: Secondary | ICD-10-CM

## 2014-10-01 NOTE — Telephone Encounter (Signed)
Requesting refill of Tramadol. Last filled 08/20/14. If approved please route to nurse to call patient to pickup prescription.

## 2014-10-02 MED ORDER — TRAMADOL HCL 50 MG PO TABS: ORAL_TABLET | ORAL | Status: DC

## 2014-10-02 NOTE — Telephone Encounter (Signed)
Left message, script for pain medication ready. 

## 2014-10-02 NOTE — Telephone Encounter (Signed)
RX ready for pick up 

## 2014-10-04 ENCOUNTER — Other Ambulatory Visit: Payer: Self-pay | Admitting: Family

## 2014-10-30 ENCOUNTER — Telehealth: Payer: Self-pay | Admitting: Family

## 2014-10-30 DIAGNOSIS — M797 Fibromyalgia: Secondary | ICD-10-CM

## 2014-10-30 MED ORDER — TRAMADOL HCL 50 MG PO TABS: ORAL_TABLET | ORAL | Status: DC

## 2014-10-30 NOTE — Telephone Encounter (Signed)
RX ready for pick up 

## 2014-10-30 NOTE — Telephone Encounter (Signed)
Left message Rx is ready for pickup

## 2014-11-27 ENCOUNTER — Other Ambulatory Visit: Payer: Self-pay | Admitting: Family

## 2014-11-27 DIAGNOSIS — M797 Fibromyalgia: Secondary | ICD-10-CM

## 2014-11-27 MED ORDER — TRAMADOL HCL 50 MG PO TABS: ORAL_TABLET | ORAL | Status: DC

## 2014-11-27 NOTE — Telephone Encounter (Signed)
Left message , script for pain medication is ready. 

## 2014-11-27 NOTE — Telephone Encounter (Signed)
Last filled 6/29. 

## 2014-12-26 ENCOUNTER — Other Ambulatory Visit: Payer: Self-pay | Admitting: Family

## 2014-12-26 DIAGNOSIS — M797 Fibromyalgia: Secondary | ICD-10-CM

## 2014-12-26 MED ORDER — TRAMADOL HCL 50 MG PO TABS: ORAL_TABLET | ORAL | Status: DC

## 2014-12-26 NOTE — Telephone Encounter (Signed)
Last filled 11/27/14, last seen 08/20/14. Rx will print

## 2014-12-26 NOTE — Telephone Encounter (Signed)
RX ready for pick up 

## 2014-12-26 NOTE — Telephone Encounter (Signed)
  Script for tramadol ready/ 

## 2015-01-27 ENCOUNTER — Other Ambulatory Visit: Payer: Self-pay | Admitting: Family

## 2015-01-27 DIAGNOSIS — M797 Fibromyalgia: Secondary | ICD-10-CM

## 2015-01-27 MED ORDER — TRAMADOL HCL 50 MG PO TABS: ORAL_TABLET | ORAL | Status: DC

## 2015-01-27 NOTE — Telephone Encounter (Signed)
Last filled 12/26/14, last seen 08/20/14 by Neysa Bonito. Rx will print

## 2015-01-27 NOTE — Telephone Encounter (Signed)
Printed refill Arville Care, MD [facility] 01/27/2015, 11:50 AM

## 2015-01-27 NOTE — Telephone Encounter (Signed)
Script placed at front desk for pick, patient informed via voicemail

## 2015-03-04 ENCOUNTER — Other Ambulatory Visit: Payer: Self-pay | Admitting: Family

## 2015-03-04 DIAGNOSIS — M797 Fibromyalgia: Secondary | ICD-10-CM

## 2015-03-04 MED ORDER — TRAMADOL HCL 50 MG PO TABS: ORAL_TABLET | ORAL | Status: DC

## 2015-03-04 NOTE — Telephone Encounter (Signed)
Last filled 9/26

## 2015-03-04 NOTE — Telephone Encounter (Signed)
RX ready for pick up 

## 2015-03-04 NOTE — Telephone Encounter (Signed)
Left detailed message stating rx ready for pick up and to CB with any further questions and concerns.

## 2015-04-02 ENCOUNTER — Telehealth: Payer: Self-pay | Admitting: Family

## 2015-04-02 DIAGNOSIS — M797 Fibromyalgia: Secondary | ICD-10-CM

## 2015-04-03 MED ORDER — TRAMADOL HCL 50 MG PO TABS: ORAL_TABLET | ORAL | Status: DC

## 2015-04-03 NOTE — Telephone Encounter (Signed)
Script ready for patient .

## 2015-04-03 NOTE — Telephone Encounter (Signed)
RX ready for pick up 

## 2015-04-30 ENCOUNTER — Telehealth: Payer: Self-pay | Admitting: Family

## 2015-04-30 DIAGNOSIS — M797 Fibromyalgia: Secondary | ICD-10-CM

## 2015-05-01 MED ORDER — TRAMADOL HCL 50 MG PO TABS: ORAL_TABLET | ORAL | Status: DC

## 2015-05-01 NOTE — Telephone Encounter (Signed)
Detailed message left for patient.

## 2015-05-01 NOTE — Telephone Encounter (Signed)
RX ready for pick up 

## 2015-05-23 ENCOUNTER — Other Ambulatory Visit: Payer: Self-pay

## 2015-05-23 DIAGNOSIS — Z3041 Encounter for surveillance of contraceptive pills: Secondary | ICD-10-CM

## 2015-05-23 NOTE — Telephone Encounter (Signed)
Last seen 08/20/14 Rita Barry 

## 2015-05-26 MED ORDER — LEVONORG-ETH ESTRAD TRIPHASIC PO TABS: 1.0000 | ORAL_TABLET | Freq: Every day | ORAL | Status: DC

## 2015-05-28 ENCOUNTER — Other Ambulatory Visit: Payer: Self-pay

## 2015-05-28 DIAGNOSIS — Z3041 Encounter for surveillance of contraceptive pills: Secondary | ICD-10-CM

## 2015-05-28 NOTE — Telephone Encounter (Signed)
Last seen 08/20/14 Christy   Requesting 90 day supply 

## 2015-05-29 MED ORDER — LEVONORG-ETH ESTRAD TRIPHASIC PO TABS: 1.0000 | ORAL_TABLET | Freq: Every day | ORAL | Status: DC

## 2015-05-30 LAB — HM MAMMOGRAPHY: HM Mammogram: NEGATIVE

## 2015-06-03 ENCOUNTER — Telehealth: Payer: Self-pay | Admitting: Family

## 2015-06-03 DIAGNOSIS — M797 Fibromyalgia: Secondary | ICD-10-CM

## 2015-06-03 MED ORDER — TRAMADOL HCL 50 MG PO TABS: ORAL_TABLET | ORAL | Status: DC

## 2015-06-03 NOTE — Telephone Encounter (Signed)
LM, script for pain medication is ready.

## 2015-06-05 ENCOUNTER — Encounter: Payer: Self-pay | Admitting: *Deleted

## 2015-06-06 ENCOUNTER — Encounter: Payer: Self-pay | Admitting: *Deleted

## 2015-06-27 ENCOUNTER — Ambulatory Visit (INDEPENDENT_AMBULATORY_CARE_PROVIDER_SITE_OTHER): Payer: Federal, State, Local not specified - PPO

## 2015-06-27 ENCOUNTER — Ambulatory Visit (INDEPENDENT_AMBULATORY_CARE_PROVIDER_SITE_OTHER): Payer: Federal, State, Local not specified - PPO | Admitting: Family

## 2015-06-27 ENCOUNTER — Encounter: Payer: Self-pay | Admitting: Family

## 2015-06-27 VITALS — BP 140/95 | HR 75 | Temp 98.0°F | Ht 67.0 in | Wt 183.6 lb

## 2015-06-27 DIAGNOSIS — R062 Wheezing: Secondary | ICD-10-CM | POA: Diagnosis not present

## 2015-06-27 DIAGNOSIS — Z1211 Encounter for screening for malignant neoplasm of colon: Secondary | ICD-10-CM

## 2015-06-27 DIAGNOSIS — R05 Cough: Secondary | ICD-10-CM

## 2015-06-27 DIAGNOSIS — M25562 Pain in left knee: Secondary | ICD-10-CM

## 2015-06-27 DIAGNOSIS — M797 Fibromyalgia: Secondary | ICD-10-CM

## 2015-06-27 DIAGNOSIS — J309 Allergic rhinitis, unspecified: Secondary | ICD-10-CM

## 2015-06-27 DIAGNOSIS — R059 Cough, unspecified: Secondary | ICD-10-CM

## 2015-06-27 MED ORDER — MONTELUKAST SODIUM 10 MG PO TABS: 10.0000 mg | ORAL_TABLET | Freq: Every day | ORAL | Status: DC

## 2015-06-27 MED ORDER — ALBUTEROL SULFATE HFA 108 (90 BASE) MCG/ACT IN AERS: 2.0000 | INHALATION_SPRAY | Freq: Four times a day (QID) | RESPIRATORY_TRACT | Status: DC | PRN

## 2015-06-27 MED ORDER — TRAMADOL HCL 50 MG PO TABS: ORAL_TABLET | ORAL | Status: DC

## 2015-06-27 NOTE — Patient Instructions (Signed)

## 2015-06-27 NOTE — Progress Notes (Signed)
Subjective:    Patient ID: Rita Barry, female    DOB: [birth year], 50 y.o.   MRN: [id number]  Knee Pain  The incident occurred more than 1 week ago. Injury mechanism: PT states over a year ago she bent down and heard a "pop". Pt states now she has pain when she goes up steps or uses a bike. The pain is present in the left knee. The quality of the pain is described as aching. The pain is at a severity of 9/10. The pain is mild. The pain has been intermittent since onset. Pertinent negatives include no inability to bear weight, loss of motion, loss of sensation, muscle weakness, numbness or tingling. She reports no foreign bodies present. Exacerbated by: going up stairs or using a bike. She has tried NSAIDs for the symptoms. The treatment provided mild relief.  Wheezing  This is a new problem. The current episode started more than 1 month ago ("6 months ago"). The problem has been waxing and waning. Associated symptoms include coughing. Pertinent negatives include no chills, fever, headaches, rhinorrhea, shortness of breath, sore throat, sputum production or swollen glands. The symptoms are aggravated by pollens and occupational exposure. She has tried rest for the symptoms. The treatment provided mild relief. There is no history of asthma.      Review of Systems  Constitutional: Negative.  Negative for fever and chills.  HENT: Negative.  Negative for rhinorrhea and sore throat.   Eyes: Negative.   Respiratory: Positive for cough and wheezing. Negative for sputum production and shortness of breath.   Cardiovascular: Negative.  Negative for palpitations.  Gastrointestinal: Negative.   Endocrine: Negative.   Genitourinary: Negative.   Musculoskeletal: Negative.   Neurological: Negative.  Negative for tingling, numbness and headaches.  Hematological: Negative.   Psychiatric/Behavioral: Negative.   All other systems reviewed and are negative.      Objective:   Physical Exam    Constitutional: She is oriented to person, place, and time. She appears well-developed and well-nourished. No distress.  HENT:  Head: Normocephalic and atraumatic.  Right Ear: External ear normal.  Left Ear: External ear normal.  Mouth/Throat: Oropharynx is clear and moist.  Nasal passage erythemas with mild swelling    Eyes: Pupils are equal, round, and reactive to light.  Neck: Normal range of motion. Neck supple. No thyromegaly present.  Cardiovascular: Normal rate, regular rhythm, normal heart sounds and intact distal pulses.   No murmur heard. Pulmonary/Chest: Effort normal and breath sounds normal. No respiratory distress. She has no wheezes.  Abdominal: Soft. Bowel sounds are normal. She exhibits no distension. There is no tenderness.  Musculoskeletal: Normal range of motion. She exhibits no edema or tenderness.  Neurological: She is alert and oriented to person, place, and time. She has normal reflexes. No cranial nerve deficit.  Skin: Skin is warm and dry.  Psychiatric: She has a normal mood and affect. Her behavior is normal. Judgment and thought content normal.  Vitals reviewed.    BP 140/95 mmHg  Pulse 75  Temp(Src) 98 F (36.7 C) (Oral)  Ht  (1.702 m)  Wt 183 lb 9.6 oz (83.28 kg)  BMI 28.75 kg/m2  Left knee x-ray-  WNL-Preliminary reading by [name], FNP [facility]      Assessment & Plan:  1. Wheezing - montelukast (SINGULAIR) 10 MG tablet; Take 1 tablet (10 mg total) by mouth at bedtime.  Dispense: 30 tablet; Refill: 3 - albuterol (PROVENTIL HFA;VENTOLIN HFA) 108 (90 Base) MCG/ACT inhaler;  Inhale 2 puffs into the lungs every 6 (six) hours as needed for wheezing or shortness of breath.  Dispense: 1 Inhaler; Refill: 2  2. Cough  3. Allergic rhinitis, unspecified allergic rhinitis type -Pt started on Singulair 10 mg today- Pt states most wheezing starts when she outside in the barn around "hay" - montelukast (SINGULAIR) 10 MG tablet; Take 1 tablet (10 mg  total) by mouth at bedtime.  Dispense: 30 tablet; Refill: 3  4. Left knee pain -Motrin prn for pain - DG Knee 1-2 Views Left; Future  5. Fibromyalgia - traMADol (ULTRAM) 50 MG tablet; TAKE 1 TABLET BY MOUTH EVERY 6 HOURS AS NEEDED  Dispense: 60 tablet; Refill: 0  6. Colon cancer screening - Ambulatory referral to Gastroenterology  [name], FNP

## 2015-07-25 ENCOUNTER — Other Ambulatory Visit: Payer: Self-pay | Admitting: Family

## 2015-07-25 DIAGNOSIS — M797 Fibromyalgia: Secondary | ICD-10-CM

## 2015-07-25 MED ORDER — TRAMADOL HCL 50 MG PO TABS: ORAL_TABLET | ORAL | Status: DC

## 2015-07-25 NOTE — Telephone Encounter (Signed)
RX ready for pick up. Pt needs appt for pain contract 

## 2015-07-25 NOTE — Telephone Encounter (Signed)
Pt aware and has appt scheduled for pain contract 3/31 at 11:25.

## 2015-07-31 ENCOUNTER — Telehealth: Payer: Self-pay

## 2015-07-31 NOTE — Telephone Encounter (Signed)
Pt received letter from DS to be triaged. Please call [phone]

## 2015-08-01 ENCOUNTER — Ambulatory Visit (INDEPENDENT_AMBULATORY_CARE_PROVIDER_SITE_OTHER): Payer: Federal, State, Local not specified - PPO | Admitting: Family

## 2015-08-01 ENCOUNTER — Encounter: Payer: Self-pay | Admitting: Family

## 2015-08-01 VITALS — BP 137/88 | HR 83 | Temp 97.8°F | Ht 67.0 in | Wt 181.0 lb

## 2015-08-01 DIAGNOSIS — Z79899 Other long term (current) drug therapy: Secondary | ICD-10-CM | POA: Diagnosis not present

## 2015-08-01 DIAGNOSIS — M797 Fibromyalgia: Secondary | ICD-10-CM

## 2015-08-01 DIAGNOSIS — F1120 Opioid dependence, uncomplicated: Secondary | ICD-10-CM | POA: Diagnosis not present

## 2015-08-01 DIAGNOSIS — Z0289 Encounter for other administrative examinations: Secondary | ICD-10-CM | POA: Insufficient documentation

## 2015-08-01 MED ORDER — TRAMADOL HCL 50 MG PO TABS: 50.0000 mg | ORAL_TABLET | Freq: Three times a day (TID) | ORAL | Status: DC | PRN

## 2015-08-01 MED ORDER — TRAMADOL HCL 50 MG PO TABS: ORAL_TABLET | ORAL | Status: DC

## 2015-08-01 NOTE — Progress Notes (Signed)
[place] Controlled Substance Abuse database reviewed- Yes  [facility] 08/01/2015 06/27/2015 08/20/2014  Decreased Interest 0 1 0  Down, Depressed, Hopeless 1 1 0  PHQ - 2 Score 1 2 0  Altered sleeping - 0 -  Tired, decreased energy 0 2 -  Change in appetite 2 2 -  Feeling bad or failure about yourself  2 1 -  Trouble concentrating 1 1 -  Moving slowly or fidgety/restless 1 0 -  Suicidal thoughts 0 0 -  PHQ-9 Score - 8 -    GAD 7 : Generalized Anxiety Score 08/01/2015  Nervous, Anxious, on Edge 1  Control/stop worrying 0  Worry too much - different things 0  Trouble relaxing 0  Restless 0  Easily annoyed or irritable 0  Afraid - awful might happen 0  Total GAD 7 Score 1       [place] drug screen performed- Yes  SOAPP  0= never  1= seldom  2=sometimes  3= often  4= very often  How often do you have mood swings? 0 How often do you smoke a cigarette within an hour after waling up? 0 How often have you taken medication other than the way that it was prescribed?0 How often have you used illegal drugs in the past 5 years? 0 How often, in your lifetime, have you had legal problems or been arrested? 1, at age 50 years old  Score 0  Alcohol Audit - How often during the last year have found that you: 0-Never   1- Less than monthly   2- Monthly     3-Weekly     4-daily or almost daily  - found that you were not able to stop drinking once you started- 0 -failed to do what was normally expected of you because of drinking- 0 -needed a first drink in the morning- 0 -had a feeling of guilt or remorse after drinking- 0 -are/were unable to remember what happened the night before because of your drinking- 0  0- NO   2- yes but not in last year  4- yes during last year -Have you or someone else been injured because of your drinking- 0 - Has anyone been concerned about your drinking or suggested you cut down- 0        TOTAL- 0  ( 0-7- alcohol education, 8-15-  simple advice, 16-19 simple advice plus counseling, 20-40 referral for evaluation and treatment 0   Designated Pharmacy- [facility] [place], [place]  Pain assessment: Cause of pain- Fibromyalgia Pain location- Generalized in shoulders, back, and legs Pain on scale of 1-10- 6 Frequency- Intermittent What increases pain-Over doing exercise or activity  What makes pain Better- Rest and pain medications   Pain management agreement reviewed and signed- Yes

## 2015-08-01 NOTE — Patient Instructions (Signed)
Myofascial Pain Syndrome and Fibromyalgia  Myofascial pain syndrome and fibromyalgia are both pain disorders. This pain may be felt mainly in your muscles.   · Myofascial pain syndrome:    Always has trigger points or tender points in the muscle that will cause pain when pressed. The pain may come and go.    Usually affects your neck, upper back, and shoulder areas. The pain often radiates into your arms and hands.  · Fibromyalgia:    Has muscle pains and tenderness that come and go.    Is often associated with fatigue and sleep disturbances.    Has trigger points.    Tends to be long-lasting (chronic), but is not life-threatening.  Fibromyalgia and myofascial pain are not the same. However, they often occur together. If you have both conditions, each can make the other worse. Both are common and can cause enough pain and fatigue to make day-to-day activities difficult.   CAUSES   The exact causes of fibromyalgia and myofascial pain are not known. People with certain gene types may be more likely to develop fibromyalgia. Some factors can be triggers for both conditions, such as:   · Spine disorders.  · Arthritis.  · Severe injury (trauma) and other physical stressors.  · Being under a lot of stress.  · A medical illness.  SIGNS AND SYMPTOMS   Fibromyalgia  The main symptom of fibromyalgia is widespread pain and tenderness in your muscles. This can vary over time. Pain is sometimes described as stabbing, shooting, or burning. You may have tingling or numbness, too. You may also have sleep problems and fatigue. You may wake up feeling tired and groggy (fibro fog). Other symptoms may include:   · Bowel and bladder problems.  · Headaches.  · Visual problems.  · Problems with odors and noises.  · Depression or mood changes.  · Painful menstrual periods (dysmenorrhea).  · Dry skin or eyes.  Myofascial pain syndrome  Symptoms of myofascial pain syndrome include:   · Tight, ropy bands of muscle.    · Uncomfortable  sensations in muscular areas, such as:    Aching.    Cramping.    Burning.    Numbness.    Tingling.      Muscle weakness.  · Trouble moving certain muscles freely (range of motion).  DIAGNOSIS   There are no specific tests to diagnose fibromyalgia or myofascial pain syndrome. Both can be hard to diagnose because their symptoms are common in many other conditions. Your health care provider may suspect one or both of these conditions based on your symptoms and medical history. Your health care provider will also do a physical exam.   The key to diagnosing fibromyalgia is having pain, fatigue, and other symptoms for more than three months that cannot be explained by another condition.   The key to diagnosing myofascial pain syndrome is finding trigger points in muscles that are tender and cause pain elsewhere in your body (referred pain).  TREATMENT   Treating fibromyalgia and myofascial pain often requires a team of health care providers. This usually starts with your primary provider and a physical therapist. You may also find it helpful to work with alternative health care providers, such as massage therapists or acupuncturists.  Treatment for fibromyalgia may include medicines. This may include nonsteroidal anti-inflammatory drugs (NSAIDs), along with other medicines.   Treatment for myofascial pain may also include:  · NSAIDs.  · Cooling and stretching of muscles.  · Trigger point injections.  ·   Sound wave (ultrasound) treatments to stimulate muscles.  HOME CARE INSTRUCTIONS   · Take medicines only as directed by your health care provider.  · Exercise as directed by your health care provider or physical therapist.  · Try to avoid stressful situations.  · Practice relaxation techniques to control your stress. You may want to try:    Biofeedback.    Visual imagery.    Hypnosis.    Muscle relaxation.    Yoga.    Meditation.  · Talk to your health care provider about alternative treatments, such as acupuncture or  massage treatment.  · Maintain a healthy lifestyle. This includes eating a healthy diet and getting enough sleep.  · Consider joining a support group.  · Do not do activities that stress or strain your muscles. That includes repetitive motions and heavy lifting.  SEEK MEDICAL CARE IF:   · You have new symptoms.  · Your symptoms get worse.  · You have side effects from your medicines.  · You have trouble sleeping.  · Your condition is causing depression or anxiety.  FOR MORE INFORMATION   · National Fibromyalgia Association: http://www.fmaware.orgwww.fmaware.org  · Arthritis Foundation: http://www.arthritis.orgwww.arthritis.org  · American Chronic Pain Association: http://www.theacpa.org/condition/myofascial-painwww.theacpa.org/condition/myofascial-pain     This information is not intended to replace advice given to you by your health care provider. Make sure you discuss any questions you have with your health care provider.     Document Released: 04/19/2005 Document Revised: 05/10/2014 Document Reviewed: 01/23/2014  Elsevier Interactive Patient Education ©2016 Elsevier Inc.

## 2015-08-05 ENCOUNTER — Telehealth: Payer: Self-pay

## 2015-08-05 NOTE — Telephone Encounter (Signed)
See separate triage.  

## 2015-08-06 LAB — TOXASSURE SELECT 13 (MW), URINE: PDF: 0

## 2015-08-06 NOTE — Telephone Encounter (Signed)
Gastroenterology Pre-Procedure Review  Request Date: 08/05/2015 Requesting Physician: Jannifer Rodneyhristy Hawks FNP  [facility]  PATIENT REVIEW QUESTIONS: The patient responded to the following health history questions as indicated:    1. Diabetes Melitis: no 2. Joint replacements in the past 12 months: no 3. Major health problems in the past 3 months: no 4. Has an artificial valve or MVP: no 5. Has a defibrillator: no 6. Has been advised in past to take antibiotics in advance of a procedure like teeth cleaning: no 7. Family history of colon cancer: no  8. Alcohol Use: YES   Maybe a couple of drinks of wine weekly 9. History of sleep apnea: no     MEDICATIONS & ALLERGIES:    Patient reports the following regarding taking any blood thinners:   Plavix? no Aspirin? no Coumadin? no  Patient confirms/reports the following medications:  Current Outpatient Prescriptions  Medication Sig Dispense Refill  . albuterol (PROVENTIL HFA;VENTOLIN HFA) 108 (90 Base) MCG/ACT inhaler Inhale 2 puffs into the lungs every 6 (six) hours as needed for wheezing or shortness of breath. 1 Inhaler 2  . escitalopram (LEXAPRO) 20 MG tablet TAKE 1 TABLET (20 MG TOTAL) BY MOUTH DAILY. 90 tablet 3  . fluticasone (FLONASE) 50 MCG/ACT nasal spray PLACE 2 SPRAYS INTO THE NOSE DAILY. 16 g 11  . traMADol (ULTRAM) 50 MG tablet TAKE 1 TABLET BY MOUTH EVERY 6 HOURS AS NEEDED 60 tablet 0  . traMADol (ULTRAM) 50 MG tablet Take 1 tablet (50 mg total) by mouth every 8 (eight) hours as needed. (Patient not taking: Reported on 08/06/2015) 60 tablet 0  . traMADol (ULTRAM) 50 MG tablet Take 1 tablet (50 mg total) by mouth every 8 (eight) hours as needed. (Patient not taking: Reported on 08/06/2015) 60 tablet 0   No current facility-administered medications for this visit.    Patient confirms/reports the following allergies:  Allergies  Allergen Reactions  . Penicillins     No orders of the defined types were placed in this encounter.     AUTHORIZATION INFORMATION Primary Insurance:  ID #:  Group #:  Pre-Cert / Auth required:  Pre-Cert / Auth #:   Secondary Insurance:   ID #:   Group #:  Pre-Cert / Auth required:  Pre-Cert / Auth #:   SCHEDULE INFORMATION: Procedure has been scheduled as follows:  Date: 09/01/2015             Time: 10:00 AM  Location: [facility]  This Gastroenterology Pre-Precedure Review Form is being routed to the following provider(s): Jonette EvaSandi Fields, MD

## 2015-08-13 NOTE — Telephone Encounter (Signed)
SUPREP SPLIT DOSING-REGULAR breakfast then CLEAR LIQUIDS after 9 am.   

## 2015-08-14 ENCOUNTER — Other Ambulatory Visit: Payer: Self-pay

## 2015-08-14 DIAGNOSIS — K080 Exfoliation of teeth due to systemic causes: Secondary | ICD-10-CM | POA: Diagnosis not present

## 2015-08-14 DIAGNOSIS — Z1211 Encounter for screening for malignant neoplasm of colon: Secondary | ICD-10-CM

## 2015-08-14 MED ORDER — NA SULFATE-K SULFATE-MG SULF 17.5-3.13-1.6 GM/177ML PO SOLN: 1.0000 | ORAL | Status: DC

## 2015-08-14 NOTE — Telephone Encounter (Signed)
Rx sent to the pharmacy and instructions mailed to pt.  

## 2015-08-25 ENCOUNTER — Encounter: Payer: Self-pay | Admitting: Family

## 2015-08-25 ENCOUNTER — Ambulatory Visit (INDEPENDENT_AMBULATORY_CARE_PROVIDER_SITE_OTHER): Payer: Federal, State, Local not specified - PPO | Admitting: Family

## 2015-08-25 VITALS — BP 137/94 | HR 73 | Temp 98.1°F | Ht 67.0 in | Wt 182.0 lb

## 2015-08-25 DIAGNOSIS — F32A Depression, unspecified: Secondary | ICD-10-CM

## 2015-08-25 DIAGNOSIS — M797 Fibromyalgia: Secondary | ICD-10-CM | POA: Diagnosis not present

## 2015-08-25 DIAGNOSIS — E559 Vitamin D deficiency, unspecified: Secondary | ICD-10-CM

## 2015-08-25 DIAGNOSIS — E785 Hyperlipidemia, unspecified: Secondary | ICD-10-CM

## 2015-08-25 DIAGNOSIS — F411 Generalized anxiety disorder: Secondary | ICD-10-CM

## 2015-08-25 DIAGNOSIS — Z0289 Encounter for other administrative examinations: Secondary | ICD-10-CM

## 2015-08-25 DIAGNOSIS — Z01419 Encounter for gynecological examination (general) (routine) without abnormal findings: Secondary | ICD-10-CM

## 2015-08-25 DIAGNOSIS — F329 Major depressive disorder, single episode, unspecified: Secondary | ICD-10-CM | POA: Diagnosis not present

## 2015-08-25 DIAGNOSIS — J309 Allergic rhinitis, unspecified: Secondary | ICD-10-CM | POA: Insufficient documentation

## 2015-08-25 DIAGNOSIS — Z0000 Encounter for general adult medical examination without abnormal findings: Secondary | ICD-10-CM

## 2015-08-25 DIAGNOSIS — F1120 Opioid dependence, uncomplicated: Secondary | ICD-10-CM

## 2015-08-25 DIAGNOSIS — M25562 Pain in left knee: Secondary | ICD-10-CM

## 2015-08-25 NOTE — Progress Notes (Addendum)
Subjective:    Patient ID: Rita Barry, female    DOB: [birth year], 50 y.o.   MRN: [id number]  Pt presents to the office today for CPE and pap. She has fibromyalgia and depression/anxiety. Pt states the tramadol is working at this time. States she takes one daily every AM. She is currently taking lexapro daily for her depression/anxiety and that is controlled at this time. Pt states she continues to have left knee pain when she is using a bike or walking up stair. PT would like an Ortho referral. PT states this pain has been going on for about a year and half.  Pt denies any headache, palpitations, SOB, or edema at this time.   Gynecologic Exam Pertinent negatives include no headaches.  Knee Pain  The incident occurred more than 1 week ago. Injury mechanism: Pt knelt down and heard a "popping noise" The pain is present in the left knee. The quality of the pain is described as aching. The pain is at a severity of 10/10. The pain is moderate. Associated symptoms include a loss of motion. Pertinent negatives include no muscle weakness or tingling. She reports no foreign bodies present. The symptoms are aggravated by movement. She has tried acetaminophen, rest, non-weight bearing and ice for the symptoms. The treatment provided mild relief.  Hyperlipidemia This is a chronic problem. The current episode started more than 1 year ago. The problem is uncontrolled. Pertinent negatives include no shortness of breath. Current antihyperlipidemic treatment includes diet change. The current treatment provides mild improvement of lipids. Risk factors for coronary artery disease include dyslipidemia, obesity and a sedentary lifestyle.  Depression      The patient presents with depression.  This is a chronic problem.  The current episode started more than 1 year ago.   The onset quality is gradual.   The problem occurs rarely.  The problem has been waxing and waning since onset.  Associated symptoms include no  hopelessness, does not have insomnia, not irritable, no body aches, no headaches, not sad and no suicidal ideas.  Past treatments include SSRIs - Selective serotonin reuptake inhibitors.  Compliance with treatment is good.  Previous treatment provided significant relief.  Past medical history includes anxiety and depression.   Anxiety Presents for follow-up visit. Onset was more than 5 years ago. The problem has been waxing and waning. Patient reports no dizziness, excessive worry, insomnia, nervous/anxious behavior, palpitations, shortness of breath or suicidal ideas. Symptoms occur rarely.   Her past medical history is significant for anxiety/panic attacks and depression. Past treatments include SSRIs. The treatment provided significant relief. Compliance with prior treatments has been good.      Review of Systems  Constitutional: Negative.   HENT: Negative.   Eyes: Negative.   Respiratory: Negative.  Negative for shortness of breath.   Cardiovascular: Negative.  Negative for palpitations.  Gastrointestinal: Negative.   Endocrine: Negative.   Genitourinary: Negative.   Musculoskeletal: Negative.   Neurological: Negative.  Negative for dizziness, tingling and headaches.  Hematological: Negative.   Psychiatric/Behavioral: Positive for depression. Negative for suicidal ideas. The patient is not nervous/anxious and does not have insomnia.   All other systems reviewed and are negative.      Objective:   Physical Exam  Constitutional: She is oriented to person, place, and time. She appears well-developed and well-nourished. She is not irritable. No distress.  HENT:  Head: Normocephalic and atraumatic.  Right Ear: External ear normal.  Left Ear: External ear normal.  Nose:  Nose normal.  Mouth/Throat: Oropharynx is clear and moist.  Eyes: Pupils are equal, round, and reactive to light.  Neck: Normal range of motion. Neck supple. No thyromegaly present.  Cardiovascular: Normal rate,  regular rhythm, normal heart sounds and intact distal pulses.   No murmur heard. Pulmonary/Chest: Effort normal and breath sounds normal. No respiratory distress. She has no wheezes. Right breast exhibits no inverted nipple, no mass, no nipple discharge, no skin change and no tenderness. Left breast exhibits no inverted nipple, no mass, no nipple discharge, no skin change and no tenderness. Breasts are symmetrical.  Abdominal: Soft. Bowel sounds are normal. She exhibits no distension. There is no tenderness.  Genitourinary: Vagina normal.  Bimanual exam- no adnexal masses or tenderness, ovaries nonpalpable   Cervix parous and pink- No discharge   Musculoskeletal: Normal range of motion. She exhibits no edema or tenderness.  Neurological: She is alert and oriented to person, place, and time. She has normal reflexes. No cranial nerve deficit.  Skin: Skin is warm and dry.  Psychiatric: She has a normal mood and affect. Her behavior is normal. Judgment and thought content normal.  Vitals reviewed.    BP 137/94 mmHg  Pulse 73  Temp(Src) 98.1 F (36.7 C) (Oral)  Ht 5' 7"  (1.702 m)  Wt 182 lb (82.555 kg)  BMI 28.50 kg/m2     Assessment & Plan:  1. Fibromyalgia - CMP14+EGFR  2. GAD (generalized anxiety disorder) - CMP14+EGFR  3. Depression - CMP14+EGFR  4. Hyperlipidemia - CMP14+EGFR - Lipid panel  5. Vitamin D deficiency - CMP14+EGFR - VITAMIN D 25 Hydroxy (Vit-D Deficiency, Fractures)  6. Annual physical exam - Anemia Profile B - CMP14+EGFR - Lipid panel - Thyroid Panel With TSH - VITAMIN D 25 Hydroxy (Vit-D Deficiency, Fractures) - Pap IG w/ reflex to HPV when ASC-U  7. Encounter for routine gynecological examination - CMP14+EGFR - Pap IG w/ reflex to HPV when ASC-U  8. Allergic rhinitis, unspecified allergic rhinitis type  9. Uncomplicated opioid dependence ([place])  10. Pain medication agreement signed  11. Left knee pain - Ambulatory referral to Orthopedic  Surgery   Continue all meds Labs pending Health Maintenance reviewed-Colonoscopy scheduled for May 1 Diet and exercise encouraged RTO 1 year  [name], FNP

## 2015-08-25 NOTE — Addendum Note (Signed)
Addended by: Jannifer RodneyHAWKS, Lyann Hagstrom A on: 08/25/2015 09:48 AM   Modules accepted: SmartSet

## 2015-08-25 NOTE — Patient Instructions (Signed)
Health Maintenance, Female Adopting a healthy lifestyle and getting preventive care can go a long way to promote health and wellness. Talk with your health care provider about what schedule of regular examinations is right for you. This is a good chance for you to check in with your provider about disease prevention and staying healthy. In between checkups, there are plenty of things you can do on your own. Experts have done a lot of research about which lifestyle changes and preventive measures are most likely to keep you healthy. Ask your health care provider for more information. WEIGHT AND DIET  Eat a healthy diet  Be sure to include plenty of vegetables, fruits, low-fat dairy products, and lean protein.  Do not eat a lot of foods high in solid fats, added sugars, or salt.  Get regular exercise. This is one of the most important things you can do for your health.  Most adults should exercise for at least 150 minutes each week. The exercise should increase your heart rate and make you sweat (moderate-intensity exercise).  Most adults should also do strengthening exercises at least twice a week. This is in addition to the moderate-intensity exercise.  Maintain a healthy weight  Body mass index (BMI) is a measurement that can be used to identify possible weight problems. It estimates body fat based on height and weight. Your health care provider can help determine your BMI and help you achieve or maintain a healthy weight.  For females 20 years of age and older:   A BMI below 18.5 is considered underweight.  A BMI of 18.5 to 24.9 is normal.  A BMI of 25 to 29.9 is considered overweight.  A BMI of 30 and above is considered obese.  Watch levels of cholesterol and blood lipids  You should start having your blood tested for lipids and cholesterol at 51 years of age, then have this test every 5 years.  You may need to have your cholesterol levels checked more often if:  Your lipid  or cholesterol levels are high.  You are older than 50 years of age.  You are at high risk for heart disease.  CANCER SCREENING   Lung Cancer  Lung cancer screening is recommended for adults 55-80 years old who are at high risk for lung cancer because of a history of smoking.  A yearly low-dose CT scan of the lungs is recommended for people who:  Currently smoke.  Have quit within the past 15 years.  Have at least a 30-pack-year history of smoking. A pack year is smoking an average of one pack of cigarettes a day for 1 year.  Yearly screening should continue until it has been 15 years since you quit.  Yearly screening should stop if you develop a health problem that would prevent you from having lung cancer treatment.  Breast Cancer  Practice breast self-awareness. This means understanding how your breasts normally appear and feel.  It also means doing regular breast self-exams. Let your health care provider know about any changes, no matter how small.  If you are in your 20s or 30s, you should have a clinical breast exam (CBE) by a health care provider every 1-3 years as part of a regular health exam.  If you are 40 or older, have a CBE every year. Also consider having a breast X-ray (mammogram) every year.  If you have a family history of breast cancer, talk to your health care provider about genetic screening.  If you   are at high risk for breast cancer, talk to your health care provider about having an MRI and a mammogram every year.  Breast cancer gene (BRCA) assessment is recommended for women who have family members with BRCA-related cancers. BRCA-related cancers include:  Breast.  Ovarian.  Tubal.  Peritoneal cancers.  Results of the assessment will determine the need for genetic counseling and BRCA1 and BRCA2 testing. Cervical Cancer Your health care provider may recommend that you be screened regularly for cancer of the pelvic organs (ovaries, uterus, and  vagina). This screening involves a pelvic examination, including checking for microscopic changes to the surface of your cervix (Pap test). You may be encouraged to have this screening done every 3 years, beginning at age 21.  For women ages 30-65, health care providers may recommend pelvic exams and Pap testing every 3 years, or they may recommend the Pap and pelvic exam, combined with testing for human papilloma virus (HPV), every 5 years. Some types of HPV increase your risk of cervical cancer. Testing for HPV may also be done on women of any age with unclear Pap test results.  Other health care providers may not recommend any screening for nonpregnant women who are considered low risk for pelvic cancer and who do not have symptoms. Ask your health care provider if a screening pelvic exam is right for you.  If you have had past treatment for cervical cancer or a condition that could lead to cancer, you need Pap tests and screening for cancer for at least 20 years after your treatment. If Pap tests have been discontinued, your risk factors (such as having a new sexual partner) need to be reassessed to determine if screening should resume. Some women have medical problems that increase the chance of getting cervical cancer. In these cases, your health care provider may recommend more frequent screening and Pap tests. Colorectal Cancer  This type of cancer can be detected and often prevented.  Routine colorectal cancer screening usually begins at 50 years of age and continues through 51 years of age.  Your health care provider may recommend screening at an earlier age if you have risk factors for colon cancer.  Your health care provider may also recommend using home test kits to check for hidden blood in the stool.  A small camera at the end of a tube can be used to examine your colon directly (sigmoidoscopy or colonoscopy). This is done to check for the earliest forms of colorectal  cancer.  Routine screening usually begins at age 50.  Direct examination of the colon should be repeated every 5-10 years through 51 years of age. However, you may need to be screened more often if early forms of precancerous polyps or small growths are found. Skin Cancer  Check your skin from head to toe regularly.  Tell your health care provider about any new moles or changes in moles, especially if there is a change in a mole's shape or color.  Also tell your health care provider if you have a mole that is larger than the size of a pencil eraser.  Always use sunscreen. Apply sunscreen liberally and repeatedly throughout the day.  Protect yourself by wearing long sleeves, pants, a wide-brimmed hat, and sunglasses whenever you are outside. HEART DISEASE, DIABETES, AND HIGH BLOOD PRESSURE   High blood pressure causes heart disease and increases the risk of stroke. High blood pressure is more likely to develop in:  People who have blood pressure in the high end   of the normal range (130-139/85-89 mm Hg).  People who are overweight or obese.  People who are African American.  If you are 38-23 years of age, have your blood pressure checked every 3-5 years. If you are 61 years of age or older, have your blood pressure checked every year. You should have your blood pressure measured twice--once when you are at a hospital or clinic, and once when you are not at a hospital or clinic. Record the average of the two measurements. To check your blood pressure when you are not at a hospital or clinic, you can use:  An automated blood pressure machine at a pharmacy.  A home blood pressure monitor.  If you are between 45 years and 39 years old, ask your health care provider if you should take aspirin to prevent strokes.  Have regular diabetes screenings. This involves taking a blood sample to check your fasting blood sugar level.  If you are at a normal weight and have a low risk for diabetes,  have this test once every three years after 51 years of age.  If you are overweight and have a high risk for diabetes, consider being tested at a younger age or more often. PREVENTING INFECTION  Hepatitis B  If you have a higher risk for hepatitis B, you should be screened for this virus. You are considered at high risk for hepatitis B if:  You were born in a country where hepatitis B is common. Ask your health care provider which countries are considered high risk.  Your parents were born in a high-risk country, and you have not been immunized against hepatitis B (hepatitis B vaccine).  You have HIV or AIDS.  You use needles to inject street drugs.  You live with someone who has hepatitis B.  You have had sex with someone who has hepatitis B.  You get hemodialysis treatment.  You take certain medicines for conditions, including cancer, organ transplantation, and autoimmune conditions. Hepatitis C  Blood testing is recommended for:  Everyone born from [birth year] through 1966.  Anyone with known risk factors for hepatitis C. Sexually transmitted infections (STIs)  You should be screened for sexually transmitted infections (STIs) including gonorrhea and chlamydia if:  You are sexually active and are younger than 51 years of age.  You are older than 51 years of age and your health care provider tells you that you are at risk for this type of infection.  Your sexual activity has changed since you were last screened and you are at an increased risk for chlamydia or gonorrhea. Ask your health care provider if you are at risk.  If you do not have HIV, but are at risk, it may be recommended that you take a prescription medicine daily to prevent HIV infection. This is called pre-exposure prophylaxis (PrEP). You are considered at risk if:  You are sexually active and do not regularly use condoms or know the HIV status of your partner(s).  You take drugs by injection.  You are sexually  active with a partner who has HIV. Talk with your health care provider about whether you are at high risk of being infected with HIV. If you choose to begin PrEP, you should first be tested for HIV. You should then be tested every 3 months for as long as you are taking PrEP.  PREGNANCY   If you are premenopausal and you may become pregnant, ask your health care provider about preconception counseling.  If you may  become pregnant, take 400 to 800 micrograms (mcg) of folic acid every day.  If you want to prevent pregnancy, talk to your health care provider about birth control (contraception). OSTEOPOROSIS AND MENOPAUSE   Osteoporosis is a disease in which the bones lose minerals and strength with aging. This can result in serious bone fractures. Your risk for osteoporosis can be identified using a bone density scan.  If you are 61 years of age or older, or if you are at risk for osteoporosis and fractures, ask your health care provider if you should be screened.  Ask your health care provider whether you should take a calcium or vitamin D supplement to lower your risk for osteoporosis.  Menopause may have certain physical symptoms and risks.  Hormone replacement therapy may reduce some of these symptoms and risks. Talk to your health care provider about whether hormone replacement therapy is right for you.  HOME CARE INSTRUCTIONS   Schedule regular health, dental, and eye exams.  Stay current with your immunizations.   Do not use any tobacco products including cigarettes, chewing tobacco, or electronic cigarettes.  If you are pregnant, do not drink alcohol.  If you are breastfeeding, limit how much and how often you drink alcohol.  Limit alcohol intake to no more than 1 drink per day for nonpregnant women. One drink equals 12 ounces of beer, 5 ounces of wine, or 1 ounces of hard liquor.  Do not use street drugs.  Do not share needles.  Ask your health care provider for help if  you need support or information about quitting drugs.  Tell your health care provider if you often feel depressed.  Tell your health care provider if you have ever been abused or do not feel safe at home.   This information is not intended to replace advice given to you by your health care provider. Make sure you discuss any questions you have with your health care provider.   Document Released: 11/02/2010 Document Revised: 05/10/2014 Document Reviewed: 03/21/2013 Elsevier Interactive Patient Education [employer].

## 2015-08-26 ENCOUNTER — Other Ambulatory Visit: Payer: Self-pay | Admitting: Family

## 2015-08-26 LAB — ANEMIA PROFILE B
Basophils Absolute: 0.1 10*3/uL (ref 0.0–0.2)
Basos: 1 %
EOS (ABSOLUTE): 0.9 10*3/uL — ABNORMAL HIGH (ref 0.0–0.4)
Eos: 19 %
Ferritin: 73 ng/mL (ref 15–150)
Folate: 15 ng/mL (ref >3.0)
Hematocrit: 37.6 % (ref 34.0–46.6)
Hemoglobin: 11.9 g/dL (ref 11.1–15.9)
Immature Grans (Abs): 0 10*3/uL (ref 0.0–0.1)
Immature Granulocytes: 0 %
Iron Saturation: 19 % (ref 15–55)
Iron: 65 ug/dL (ref 27–159)
Lymphocytes Absolute: 1.2 10*3/uL (ref 0.7–3.1)
Lymphs: 25 %
MCH: 30.9 pg (ref 26.6–33.0)
MCHC: 31.6 g/dL (ref 31.5–35.7)
MCV: 98 fL — ABNORMAL HIGH (ref 79–97)
Monocytes Absolute: 0.4 10*3/uL (ref 0.1–0.9)
Monocytes: 8 %
Neutrophils Absolute: 2.2 10*3/uL (ref 1.4–7.0)
Neutrophils: 47 %
Platelets: 337 10*3/uL (ref 150–379)
RBC: 3.85 x10E6/uL (ref 3.77–5.28)
RDW: 13.4 % (ref 12.3–15.4)
Retic Ct Pct: 1.2 % (ref 0.6–2.6)
Total Iron Binding Capacity: 347 ug/dL (ref 250–450)
UIBC: 282 ug/dL (ref 131–425)
Vitamin B-12: >2000 pg/mL — ABNORMAL HIGH (ref 211–946)
WBC: 4.8 10*3/uL (ref 3.4–10.8)

## 2015-08-26 LAB — CMP14+EGFR
ALT: 38 IU/L — ABNORMAL HIGH (ref 0–32)
AST: 36 IU/L (ref 0–40)
Albumin/Globulin Ratio: 1.4 (ref 1.2–2.2)
Albumin: 4.1 g/dL (ref 3.5–5.5)
Alkaline Phosphatase: 25 IU/L — ABNORMAL LOW (ref 39–117)
BUN/Creatinine Ratio: 21 (ref 9–23)
BUN: 16 mg/dL (ref 6–24)
Bilirubin Total: 0.4 mg/dL (ref 0.0–1.2)
CO2: 22 mmol/L (ref 18–29)
Calcium: 9.4 mg/dL (ref 8.7–10.2)
Chloride: 96 mmol/L (ref 96–106)
Creatinine, Ser: 0.77 mg/dL (ref 0.57–1.00)
GFR calc Af Amer: 104 mL/min/{1.73_m2} (ref >59)
GFR calc non Af Amer: 90 mL/min/{1.73_m2} (ref >59)
Globulin, Total: 2.9 g/dL (ref 1.5–4.5)
Glucose: 90 mg/dL (ref 65–99)
Potassium: 4.3 mmol/L (ref 3.5–5.2)
Sodium: 135 mmol/L (ref 134–144)
Total Protein: 7 g/dL (ref 6.0–8.5)

## 2015-08-26 LAB — LIPID PANEL
Chol/HDL Ratio: 3.1 ratio units (ref 0.0–4.4)
Cholesterol, Total: 225 mg/dL — ABNORMAL HIGH (ref 100–199)
HDL: 72 mg/dL (ref >39)
LDL Calculated: 133 mg/dL — ABNORMAL HIGH (ref 0–99)
Triglycerides: 99 mg/dL (ref 0–149)
VLDL Cholesterol Cal: 20 mg/dL (ref 5–40)

## 2015-08-26 LAB — THYROID PANEL WITH TSH
Free Thyroxine Index: 1.9 (ref 1.2–4.9)
T3 Uptake Ratio: 34 % (ref 24–39)
T4, Total: 5.6 ug/dL (ref 4.5–12.0)
TSH: 2.04 u[IU]/mL (ref 0.450–4.500)

## 2015-08-26 LAB — VITAMIN D 25 HYDROXY (VIT D DEFICIENCY, FRACTURES): Vit D, 25-Hydroxy: 34.8 ng/mL (ref 30.0–100.0)

## 2015-08-27 ENCOUNTER — Telehealth: Payer: Self-pay

## 2015-08-27 ENCOUNTER — Other Ambulatory Visit: Payer: Self-pay | Admitting: Family

## 2015-08-27 LAB — PAP IG W/ RFLX HPV ASCU: PAP Smear Comment: 0

## 2015-08-27 MED ORDER — SIMVASTATIN 20 MG PO TABS
20.0000 mg | ORAL_TABLET | Freq: Every day | ORAL | Status: DC
Start: 2015-08-27 — End: 2015-11-26

## 2015-08-27 NOTE — Telephone Encounter (Signed)
No PA needed for [place]CS [place] 08/27[place]15

## 2015-09-01 ENCOUNTER — Encounter (HOSPITAL_COMMUNITY)
Admission: RE | Disposition: A | Discharge status: Home / Self Care | Payer: Self-pay | Source: Ambulatory Visit | Attending: Gastroenterology

## 2015-09-01 ENCOUNTER — Ambulatory Visit (HOSPITAL_COMMUNITY)
Admission: RE | Admit: 2015-09-01 | Discharge: 2015-09-01 | Disposition: A | Discharge status: Home / Self Care | Payer: Federal, State, Local not specified - PPO | Source: Ambulatory Visit | Attending: Gastroenterology | Admitting: Gastroenterology

## 2015-09-01 ENCOUNTER — Encounter (HOSPITAL_COMMUNITY): Payer: Self-pay | Admitting: *Deleted

## 2015-09-01 DIAGNOSIS — Q438 Other specified congenital malformations of intestine: Secondary | ICD-10-CM | POA: Insufficient documentation

## 2015-09-01 DIAGNOSIS — Z1211 Encounter for screening for malignant neoplasm of colon: Secondary | ICD-10-CM | POA: Insufficient documentation

## 2015-09-01 DIAGNOSIS — K644 Residual hemorrhoidal skin tags: Secondary | ICD-10-CM | POA: Diagnosis not present

## 2015-09-01 DIAGNOSIS — F329 Major depressive disorder, single episode, unspecified: Secondary | ICD-10-CM | POA: Insufficient documentation

## 2015-09-01 DIAGNOSIS — K6389 Other specified diseases of intestine: Secondary | ICD-10-CM | POA: Diagnosis not present

## 2015-09-01 DIAGNOSIS — K648 Other hemorrhoids: Secondary | ICD-10-CM | POA: Insufficient documentation

## 2015-09-01 DIAGNOSIS — M797 Fibromyalgia: Secondary | ICD-10-CM | POA: Diagnosis not present

## 2015-09-01 DIAGNOSIS — F419 Anxiety disorder, unspecified: Secondary | ICD-10-CM | POA: Insufficient documentation

## 2015-09-01 DIAGNOSIS — Z79899 Other long term (current) drug therapy: Secondary | ICD-10-CM | POA: Insufficient documentation

## 2015-09-01 HISTORY — PX: COLONOSCOPY: SHX5424

## 2015-09-01 SURGERY — COLONOSCOPY
Anesthesia: Moderate Sedation

## 2015-09-01 MED ORDER — STERILE WATER FOR IRRIGATION IR SOLN
Status: DC | PRN
  Administered 2015-09-01: 10:00:00

## 2015-09-01 MED ORDER — MIDAZOLAM HCL 5 MG/5ML IJ SOLN
INTRAMUSCULAR | Status: AC
  Filled 2015-09-01: qty 10

## 2015-09-01 MED ORDER — MIDAZOLAM HCL 5 MG/5ML IJ SOLN
INTRAMUSCULAR | Status: DC | PRN
  Administered 2015-09-01: 3 mg via INTRAVENOUS
  Administered 2015-09-01 (×2): 2 mg via INTRAVENOUS

## 2015-09-01 MED ORDER — MEPERIDINE HCL 100 MG/ML IJ SOLN
INTRAMUSCULAR | Status: DC | PRN
  Administered 2015-09-01: 50 mg via INTRAVENOUS
  Administered 2015-09-01: 25 mg via INTRAVENOUS
  Administered 2015-09-01: 50 mg via INTRAVENOUS

## 2015-09-01 MED ORDER — PROMETHAZINE HCL 25 MG/ML IJ SOLN
INTRAMUSCULAR | Status: DC
Start: 2015-09-01 — End: 2015-09-01
  Filled 2015-09-01: qty 1

## 2015-09-01 MED ORDER — MEPERIDINE HCL 100 MG/ML IJ SOLN
INTRAMUSCULAR | Status: AC
  Filled 2015-09-01: qty 2

## 2015-09-01 MED ORDER — SODIUM CHLORIDE 0.9 % IV SOLN
INTRAVENOUS | Status: DC
  Administered 2015-09-01: 09:00:00 via INTRAVENOUS

## 2015-09-01 NOTE — Discharge Instructions (Signed)
You have internal hemorrhoids. YOU DID NOT HAVE ANY POLYPS.  DRINK WATER TO KEEP YOUR URINE LIGHT YELLOW.  FOLLOW A HIGH FIBER DIET. AVOID ITEMS THAT CAUSE BLOATING. SEE INFO BELOW.  CONTINUE NATURAL LAXATIVE. USE AS NEEDED TO PREVENT CONSTIPATION.  USE CITRUCEL OR OTHER FIBER SUPPLEMENT DAILY TO INCREASE FIBER IN YOUR DIET. AVOID IT IF IT CAUSES BLOATING & GAS.  USE PREPARATION H FOUR TIMEs  A DAY AS NEEDED TO RELIEVE RECTAL PAIN/PRESSURE/BLEEDING.  Next colonoscopy in 10 years.  Colonoscopy Care After Read the instructions outlined below and refer to this sheet in the next week. These discharge instructions provide you with general information on caring for yourself after you leave the hospital. While your treatment has been planned according to the most current medical practices available, unavoidable complications occasionally occur. If you have any problems or questions after discharge, call DR. Nyala Kirchner, [phone].  ACTIVITY  You may resume your regular activity, but move at a slower pace for the next 24 hours.   Take frequent rest periods for the next 24 hours.   Walking will help get rid of the air and reduce the bloated feeling in your belly (abdomen).   No driving for 24 hours (because of the medicine (anesthesia) used during the test).   You may shower.   Do not sign any important legal documents or operate any machinery for 24 hours (because of the anesthesia used during the test).    NUTRITION  Drink plenty of fluids.   You may resume your normal diet as instructed by your doctor.   Begin with a light meal and progress to your normal diet. Heavy or fried foods are harder to digest and may make you feel sick to your stomach (nauseated).   Avoid alcoholic beverages for 24 hours or as instructed.    MEDICATIONS  You may resume your normal medications.   WHAT YOU CAN EXPECT TODAY  Some feelings of bloating in the abdomen.   Passage of more gas than  usual.   Spotting of blood in your stool or on the toilet paper  .  IF YOU HAD POLYPS REMOVED DURING THE COLONOSCOPY:  Eat a soft diet IF YOU HAVE NAUSEA, BLOATING, ABDOMINAL PAIN, OR VOMITING.    FINDING OUT THE RESULTS OF YOUR TEST Not all test results are available during your visit. DR. Darrick PennaFIELDS WILL CALL YOU WITHIN 7 DAYS OF YOUR PROCEDUE WITH YOUR RESULTS. Do not assume everything is normal if you have not heard from DR. Macyn Shropshire IN ONE WEEK, CALL HER OFFICE AT [phone].  SEEK IMMEDIATE MEDICAL ATTENTION AND CALL THE OFFICE: [phone] IF:  You have more than a spotting of blood in your stool.   Your belly is swollen (abdominal distention).   You are nauseated or vomiting.   You have a temperature over 101F.   You have abdominal pain or discomfort that is severe or gets worse throughout the day.  High-Fiber Diet A high-fiber diet changes your normal diet to include more whole grains, legumes, fruits, and vegetables. Changes in the diet involve replacing refined carbohydrates with unrefined foods. The calorie level of the diet is essentially unchanged. The Dietary Reference Intake (recommended amount) for adult males is 38 grams per day. For adult females, it is 25 grams per day. Pregnant and lactating women should consume 28 grams of fiber per day. Fiber is the intact part of a plant that is not broken down during digestion. Functional fiber is fiber that has been isolated from  the plant to provide a beneficial effect in the body. PURPOSE  Increase stool bulk.   Ease and regulate bowel movements.   Lower cholesterol.  REDUCE RISK OF COLON CANCER  INDICATIONS THAT YOU NEED MORE FIBER  Constipation and hemorrhoids.   Uncomplicated diverticulosis (intestine condition) and irritable bowel syndrome.   Weight management.   As a protective measure against hardening of the arteries (atherosclerosis), diabetes, and cancer.   GUIDELINES FOR INCREASING FIBER IN THE  DIET  Start adding fiber to the diet slowly. A gradual increase of about 5 more grams (2 slices of whole-wheat bread, 2 servings of most fruits or vegetables, or 1 bowl of high-fiber cereal) per day is best. Too rapid an increase in fiber may result in constipation, flatulence, and bloating.   Drink enough water and fluids to keep your urine clear or pale yellow. Water, juice, or caffeine-free drinks are recommended. Not drinking enough fluid may cause constipation.   Eat a variety of high-fiber foods rather than one type of fiber.   Try to increase your intake of fiber through using high-fiber foods rather than fiber pills or supplements that contain small amounts of fiber.   The goal is to change the types of food eaten. Do not supplement your present diet with high-fiber foods, but replace foods in your present diet.    INCLUDE A VARIETY OF FIBER SOURCES  Replace refined and processed grains with whole grains, canned fruits with fresh fruits, and incorporate other fiber sources. White rice, white breads, and most bakery goods contain little or no fiber.   Brown whole-grain rice, buckwheat oats, and many fruits and vegetables are all good sources of fiber. These include: broccoli, Brussels sprouts, cabbage, cauliflower, beets, sweet potatoes, white potatoes (skin on), carrots, tomatoes, eggplant, squash, berries, fresh fruits, and dried fruits.   Cereals appear to be the richest source of fiber. Cereal fiber is found in whole grains and bran. Bran is the fiber-rich outer coat of cereal grain, which is largely removed in refining. In whole-grain cereals, the bran remains. In breakfast cereals, the largest amount of fiber is found in those with "bran" in their names. The fiber content is sometimes indicated on the label.   You may need to include additional fruits and vegetables each day.   In baking, for 1 cup white flour, you may use the following substitutions:   1 cup whole-wheat flour  minus 2 tablespoons.   1/2 cup white flour plus 1/2 cup whole-wheat flour.   Hemorrhoids Hemorrhoids are dilated (enlarged) veins around the rectum. Sometimes clots will form in the veins. This makes them swollen and painful. These are called thrombosed hemorrhoids. Causes of hemorrhoids include:  Constipation.   Straining to have a bowel movement.   HEAVY LIFTING  HOME CARE INSTRUCTIONS  Eat a well balanced diet and drink 6 to 8 glasses of water every day to avoid constipation. You may also use a bulk laxative.   Avoid straining to have bowel movements.   Keep anal area dry and clean.   Do not use a donut shaped pillow or sit on the toilet for long periods. This increases blood pooling and pain.   Move your bowels when your body has the urge; this will require less straining and will decrease pain and pressure.

## 2015-09-01 NOTE — Op Note (Signed)
[facility] Patient Name: Rita Barry Procedure Date: 09/01/2015 9:33 AM MRN: [id number] Date of Birth: 03-01-1966 Attending MD: Jonette Eva , MD CSN: [id number] Age: 50 Admit Type: Outpatient Procedure:                Colonoscopy Indications:              Screening for colorectal malignant neoplasm Providers:                Jonette Eva, MD, Jannett Celestine, RN, Burke Keels,                            Technician Referring MD:             Edilia Bo Hawks Medicines:                Meperidine 125 mg IV, Midazolam 7 mg IV Complications:            No immediate complications. Estimated Blood Loss:     Estimated blood loss: none. Procedure:                Pre-Anesthesia Assessment:                           - Prior to the procedure, a History and Physical                            was performed, and patient medications and                            allergies were reviewed. The patient's tolerance of                            previous anesthesia was also reviewed. The risks                            and benefits of the procedure and the sedation                            options and risks were discussed with the patient.                            All questions were answered, and informed consent                            was obtained. Prior Anticoagulants: The patient has                            taken no previous anticoagulant or antiplatelet                            agents. ASA Grade Assessment: II - A patient with                            mild systemic disease. After reviewing the risks  and benefits, the patient was deemed in                            satisfactory condition to undergo the procedure.                           After obtaining informed consent, the colonoscope                            was passed under direct vision. Throughout the                            procedure, the patient's blood pressure, pulse, and               oxygen saturations were monitored continuously. The                            EC-[id number] ([id number]) scope was introduced through                            the anus and advanced to the the cecum, identified                            by appendiceal orifice and ileocecal valve. The                            ileocecal valve, appendiceal orifice, and rectum                            were photographed. The colonoscopy was somewhat                            difficult due to significant looping. Successful                            completion of the procedure was aided by using                            manual pressure. The quality of the bowel                            preparation was excellent. Scope In: 9:57:26 AM Scope Out: 10:12:18 AM Scope Withdrawal Time: 0 hours 8 minutes 14 seconds  Total Procedure Duration: 0 hours 14 minutes 52 seconds  Findings:      The digital rectal exam findings include non-thrombosed external       hemorrhoids.      The recto-sigmoid colon and transverse colon were moderately redundant.       Advancing the scope required using manual pressure.      Non-bleeding external and internal hemorrhoids were found.      A diffuse area of melanosis was found in the entire colon. Impression:               - Non-thrombosed external hemorrhoids found on  digital rectal exam.                           - Redundant colon.                           - Non-bleeding external and internal hemorrhoids.                           - Melanosis in the colon.                           - No specimens collected. Moderate Sedation:      Moderate (conscious) sedation was administered by the endoscopy nurse       and supervised by the endoscopist. The following parameters were       monitored: oxygen saturation, heart rate, blood pressure, and response       to care. Total physician intraservice time was 30 minutes. Recommendation:           -  Patient has a contact number available for                            emergencies. The signs and symptoms of potential                            delayed complications were discussed with the                            patient. Return to normal activities tomorrow.                            Written discharge instructions were provided to the                            patient.                           - High fiber diet.                           - Continue present medications.                           - Repeat colonoscopy in 10 years for surveillance.                           DRINK WATER TO KEEP YOUR URINE LIGHT YELLOW.                           CONTINUE NATURAL LAXATIVE. USE AS NEEDED TO PREVENT                            CONSTIPATION.                           USE CITRUCEL OR OTHER FIBER SUPPLEMENT DAILY TO  INCREASE FIBER IN YOUR DIET. AVOID IT IF IT CAUSES                            BLOATING & GAS.                           USE PREPARATION H FOUR TIMEs A DAY AS NEEDED TO                            RELIEVE RECTAL PAIN/PRESSURE/BLEEDING. Procedure Code(s):        --- Professional ---                           Z6109G0121, Colorectal cancer screening; colonoscopy on                            individual not meeting criteria for high risk                           99153, Moderate sedation services; each additional                            15 minutes intraservice time                           G0500, Moderate sedation services provided by the                            same physician or other qualified health care                            professional performing a gastrointestinal                            endoscopic service that sedation supports,                            requiring the presence of an independent trained                            observer to assist in the monitoring of the                            patient's level of consciousness and  physiological                            status; initial 15 minutes of intra-service time;                            patient age 24 years or older (additional time may                            be reported with [id number], as appropriate) Diagnosis Code(s):        --- Professional ---  Z12.11, Encounter for screening for malignant                            neoplasm of colon                           K63.89, Other specified diseases of intestine                           K64.4, Residual hemorrhoidal skin tags                           K64.8, Other hemorrhoids                           Q43.8, Other specified congenital malformations of                            intestine CPT copyright 2016 American Medical Association. All rights reserved. The codes documented in this report are preliminary and upon coder review may  be revised to meet current compliance requirements. Jonette Eva, MD Jonette Eva, MD 09/01/2015 2:21:33 PM This report has been signed electronically. Number of Addenda: 0

## 2015-09-01 NOTE — H&P (Signed)
Primary Care Physician:  [name], FNP Primary Gastroenterologist:  Dr. Darrick Penna  Pre-Procedure History & Physical: HPI:  Rita Barry is a 50 y.o. female here for COLON CANCER SCREENING.  Past Medical History  Diagnosis Date  . Fibromyalgia     Questionable  . Anxiety   . Depression     Past Surgical History  Procedure Laterality Date  . Shoulder surgery      Left  . Shoulder surgery Left     x2 surgeries    Prior to Admission medications   Medication Sig Start Date End Date Taking? Authorizing Provider  albuterol (PROVENTIL HFA;VENTOLIN HFA) 108 (90 Base) MCG/ACT inhaler Inhale 2 puffs into the lungs every 6 (six) hours as needed for wheezing or shortness of breath. 06/27/15  Yes Junie Spencer, FNP  escitalopram (LEXAPRO) 20 MG tablet TAKE 1 TABLET (20 MG TOTAL) BY MOUTH DAILY. 08/26/15  Yes Junie Spencer, FNP  fexofenadine (ALLEGRA) 180 MG tablet Take 180 mg by mouth daily. Reported on 08/25/2015   Yes Historical Provider, MD  fluticasone (FLONASE) 50 MCG/ACT nasal spray PLACE 2 SPRAYS INTO THE NOSE DAILY. 08/20/14  Yes Junie Spencer, FNP  GARLIC PO Take 1 tablet by mouth daily.   Yes Historical Provider, MD  Ibuprofen-Diphenhydramine Cit (ADVIL PM PO) Take 1 tablet by mouth at bedtime. Reported on 08/25/2015   Yes Historical Provider, MD  Magnesium 250 MG TABS Take 1 tablet by mouth daily.   Yes Historical Provider, MD  Milk Thistle 250 MG CAPS Take 1 capsule by mouth daily.   Yes Historical Provider, MD  Multiple Vitamins-Minerals (WOMENS MULTI VITAMIN & MINERAL PO) Take 1 tablet by mouth daily.   Yes Historical Provider, MD  Omega-3 Krill Oil 500 MG CAPS Take 1 capsule by mouth daily.   Yes Historical Provider, MD  Probiotic Product (PHILLIPS COLON HEALTH PO) Take 1 tablet by mouth daily.   Yes Historical Provider, MD  traMADol (ULTRAM) 50 MG tablet TAKE 1 TABLET BY MOUTH EVERY 6 HOURS AS NEEDED 08/01/15  Yes Junie Spencer, FNP  Turmeric 500 MG TABS Take 1  capsule by mouth daily.   Yes Historical Provider, MD  Vitamin D, Cholecalciferol, 1000 units CAPS Take 1 tablet by mouth daily.   Yes Historical Provider, MD  simvastatin (ZOCOR) 20 MG tablet Take 1 tablet (20 mg total) by mouth daily. 08/27/15   Junie Spencer, FNP  traMADol (ULTRAM) 50 MG tablet Take 1 tablet (50 mg total) by mouth every 8 (eight) hours as needed. Patient not taking: Reported on 08/18/2015 08/01/15   Junie Spencer, FNP  traMADol (ULTRAM) 50 MG tablet Take 1 tablet (50 mg total) by mouth every 8 (eight) hours as needed. Patient not taking: Reported on 08/18/2015 08/01/15   Junie Spencer, FNP    Allergies as of 08/14/2015 - Review Complete 08/05/2015  Allergen Reaction Noted  . Penicillins  12/30/2010    Family History  Problem Relation Age of Onset  . Cancer Father     Throat  . Hypertension Mother   . Hyperlipidemia Mother   . Cancer Mother     Social History   Social History  . Marital Status: Married    Spouse Name: N/A  . Number of Children: N/A  . Years of Education: N/A   Occupational History  . Works with Horses    Social History Main Topics  . Smoking status: Never Smoker   . Smokeless tobacco: Never Used  .  Alcohol Use: 2.4 oz/week    4 Glasses of wine per week  . Drug Use: No  . Sexual Activity: Yes   Other Topics Concern  . Not on file   Social History Narrative    Review of Systems: See HPI, otherwise negative ROS   Physical Exam: BP 140/89 mmHg  Pulse 82  Temp(Src) 98.5 F (36.9 C) (Oral)  Resp 16  Ht 5\' 7"  (1.702 m)  Wt 182 lb (82.555 kg)  BMI 28.50 kg/m2  SpO2 100% General:   Alert,  pleasant and cooperative in NAD Head:  Normocephalic and atraumatic. Neck:  Supple; Lungs:  Clear throughout to auscultation.    Heart:  Regular rate and rhythm. Abdomen:  Soft, nontender and nondistended. Normal bowel sounds, without guarding, and without rebound.   Neurologic:  Alert and  oriented x4;  grossly normal  neurologically.  Impression/Plan:     SCREENING  Plan:  1. TCS TODAY

## 2015-09-03 ENCOUNTER — Encounter (HOSPITAL_COMMUNITY): Payer: Self-pay | Admitting: Gastroenterology

## 2015-09-15 ENCOUNTER — Other Ambulatory Visit: Payer: Self-pay | Admitting: Family

## 2015-09-24 DIAGNOSIS — M228X2 Other disorders of patella, left knee: Secondary | ICD-10-CM | POA: Diagnosis not present

## 2015-09-24 DIAGNOSIS — M1712 Unilateral primary osteoarthritis, left knee: Secondary | ICD-10-CM | POA: Diagnosis not present

## 2015-10-01 DIAGNOSIS — K080 Exfoliation of teeth due to systemic causes: Secondary | ICD-10-CM | POA: Diagnosis not present

## 2015-10-02 ENCOUNTER — Ambulatory Visit: Payer: Federal, State, Local not specified - PPO | Admitting: Physical Therapy

## 2015-10-06 ENCOUNTER — Telehealth: Payer: Self-pay | Admitting: Family

## 2015-10-06 NOTE — Telephone Encounter (Signed)
Patient wants refill on birth control last seen 4/24

## 2015-10-07 ENCOUNTER — Telehealth: Payer: Self-pay | Admitting: Family

## 2015-10-07 MED ORDER — NORETHINDRONE 0.35 MG PO TABS: 1.0000 | ORAL_TABLET | Freq: Every day | ORAL | Status: DC

## 2015-10-07 NOTE — Telephone Encounter (Signed)
Norethindrone Prescription sent to pharmacy. This is a progestin only contraceptive. Pt needs to take every day at same time. Pt can not have other type of contraceptive (estrogen with progestin) because of her age and risks associated with blood clots.

## 2015-10-07 NOTE — Telephone Encounter (Signed)
Spoke with patient. She was wanting to know why she could not have estrogen. I explained to patient why Rita Barry did not want her to have estrogen and patient understood and agreed.

## 2015-10-07 NOTE — Telephone Encounter (Signed)
Pt aware.

## 2015-10-09 ENCOUNTER — Encounter: Payer: Self-pay | Admitting: Physical Therapy

## 2015-10-09 ENCOUNTER — Ambulatory Visit: Payer: Federal, State, Local not specified - PPO | Attending: Orthopedic Surgery | Admitting: Physical Therapy

## 2015-10-09 DIAGNOSIS — M25562 Pain in left knee: Secondary | ICD-10-CM | POA: Diagnosis not present

## 2015-10-09 NOTE — Therapy (Signed)
[facility] [address] [place], [place], [address] Phone: [phone]   Fax:  [phone]  Physical Therapy Evaluation  Patient Details  Name: Rita Barry MRN: [id number] Date of Birth: 10/02/1965 Referring Provider: Jodene Nam PA-C  Encounter Date: 10/09/2015      PT End of Session - 10/09/15 1120    Visit Number 1   Number of Visits 8   Date for PT Re-Evaluation 11/06/15   PT Start Time 1040   PT Stop Time 1130   PT Time Calculation (min) 50 min   Activity Tolerance Patient tolerated treatment well   Behavior During Therapy [facility] for tasks assessed/performed      Past Medical History  Diagnosis Date  . Fibromyalgia     Questionable  . Anxiety   . Depression     Past Surgical History  Procedure Laterality Date  . Shoulder surgery      Left  . Shoulder surgery Left     x2 surgeries  . Colonoscopy N/A 09/01/2015    Procedure: COLONOSCOPY;  Surgeon: West Bali, MD;  Location: [facility];  Service: Endoscopy;  Laterality: N/A;  9:45 AM    There were no vitals filed for this visit.       Subjective Assessment - 10/09/15 1041    Subjective About 1.5 years ago patient kneeled down and heard a popping in her left knee. Since then pain has progressed    Pertinent History HTN   How long can you sit comfortably? sit to stand   Patient Stated Goals be able to climb stairs, ride bike and exercise more   Currently in Pain? Yes   Pain Score 8    Pain Location Knee   Pain Orientation Left   Pain Descriptors / Indicators Sharp   Pain Type Chronic pain   Pain Onset More than a month ago   Aggravating Factors  stairs, sit<>stand, bike   Pain Relieving Factors nothing   Effect of Pain on Daily Activities limited            [facility] PT Assessment - 10/09/15 0001    Assessment   Medical Diagnosis maltracking of left patella; OA of L PFJ   Referring Provider Jodene Nam PA-C   Onset Date/Surgical Date  05/10/14   Next MD Visit 10/24/15   Prior Therapy none   Precautions   Precaution Comments no painful knee extension, leg presses, squats   Balance Screen   Has the patient fallen in the past 6 months No   Has the patient had a decrease in activity level because of a fear of falling?  No   Is the patient reluctant to leave their home because of a fear of falling?  No   Home Environment   Additional Comments has second floor but avoiding at this time   Prior Function   Level of Independence Independent   Posture/Postural Control   Posture Comments mild ER of R hip   ROM / Strength   AROM / PROM / Strength AROM;PROM;Strength   AROM   Overall AROM Comments L knee WNL   Strength   Overall Strength Comments L hip flex, ext 4+/5; abd 5/5, L knee flex/ext 5-/5 with some discomfort with resisted ext   Flexibility   Soft Tissue Assessment /Muscle Length yes   Hamstrings mod tightness L   Quadriceps mild tightness L   ITB + ober L    Piriformis mod tightness L   Palpation  Patella mobility some tightness with med glide   Palpation comment tenderness in L ITB, distal VLO and RF                   [facility] Adult PT Treatment/Exercise - 10/09/15 0001    Exercises   Exercises Knee/Hip   Modalities   Modalities Moist Heat   Moist Heat Therapy   Number Minutes Moist Heat 10 Minutes   Moist Heat Location Knee  distl quad and ITB L          Trigger Point Dry Needling - 10/09/15 1231    Consent Given? Yes   Education Handout Provided Yes   Muscles Treated Lower Body Quadriceps  ITB   Quadriceps Response Twitch response elicited;Palpable increased muscle length  also in distal ITB              PT Education - 10/09/15 1232    Education provided Yes   Education Details HEP and DN education including aftercare   Person(s) Educated Patient   Methods Explanation;Demonstration;Handout   Comprehension Verbalized understanding;Returned demonstration              PT Long Term Goals - 10/09/15 1239    PT LONG TERM GOAL #1   Title I with HEP   Time 4   Period Weeks   Status New   PT LONG TERM GOAL #2   Title Decreased pain with ADLs including stairs by 50% or more.   Time 4   Period Weeks   Status New   PT LONG TERM GOAL #3   Title Patient able to peform sit to stand without pain   Time 4   Period Weeks   Status New               Plan - 10/09/15 1233    Clinical Impression Statement Patient presents with intermittent pain in L patellofemoral joint with stairs, sit to stand and knee extension. She has marked tightness in L ITB which is likely contributing to maltracking. She responded well to DN in her distal RF, VLO and ITB.   Rehab Potential Good   PT Frequency 2x / week   PT Duration 4 weeks   PT Treatment/Interventions ADLs/Self Care Home Management;Moist Heat;Therapeutic exercise;Ultrasound;Neuromuscular re-education;Patient/family education;Manual techniques;Taping;Dry needling   PT Next Visit Plan assess DN and continue as indicated; manual to L ITB; painfree quad strengthening including eccentric.   PT Home Exercise Plan stretches quads, itb and HS   Consulted and Agree with Plan of Care Patient      Patient will benefit from skilled therapeutic intervention in order to improve the following deficits and impairments:  Pain, Decreased activity tolerance, Impaired flexibility, Decreased strength  Visit Diagnosis: Pain in left knee - Plan: PT plan of care cert/re-cert     Problem List Patient Active Problem List   Diagnosis Date Noted  . Special screening for malignant neoplasms, colon   . Allergic rhinitis 08/25/2015  . Pain medication agreement signed 08/01/2015  . Opioid dependence (HCC) 08/01/2015  . Vitamin D deficiency 08/23/2014  . Hyperlipidemia 08/23/2014  . GAD (generalized anxiety disorder) 09/18/2013  . Benign paroxysmal positional vertigo 03/02/2013  . General counseling for prescription of oral  contraceptives 01/09/2013  . Dysmenorrhea 01/09/2013  . Depression 08/08/2012  . Fibromyalgia 08/08/2012  . Weight gain 08/08/2012  . Elevated BP 08/08/2012    Solon PalmJulie Ghada Abbett PT  10/09/2015, 12:45 PM  [facility] [address] [place], [place],  [address] Phone: [phone]   Fax:  [phone]  Name: LEONDA CRISTO MRN: [id number] Date of Birth: [birth year]

## 2015-10-09 NOTE — Patient Instructions (Signed)
  Hamstring Stretch, Reclined (Strap, Doorframe)   Lengthen bottom leg on floor. Extend top leg along edge of doorframe or press foot up into yoga strap. Hold for 30 seconds. Repeat 3_ times each leg.  Piriformis (Supine)  Cross legs, right on top. Gently pull other knee toward chest until stretch is felt in buttock/hip of top leg. Hold _30___ seconds. Repeat _3___ times per set. Do ____ sets per session. Do __2__ sessions per day.   Quadriceps (Prone)   On stomach with sheet around ankles, knees together, hips down, pull heels toward bottom. Keep hips flat. Hold __30__ seconds. Repeat _3__ times. Do __3__ sessions per day. CAUTION: Stretch should be gentle, steady and slow.  Copyright  VHI. All rights reserved.    Outer Hip Stretch: Reclined IT Band Stretch (Strap)   Strap around opposite foot, pull across only as far as possible with shoulders on mat. Hold for __30__ seconds. Repeat __3__ times each leg. 2-3 x/day.  Copyright  VHI. All rights reserved.   Solon PalmJulie Analleli Gierke, PT 10/09/2015 11:05 AM [facility] [address] [place], [place], [address] Phone: [phone]   Fax:  [phone]   Trigger Point Dry Needling  . What is Trigger Point Dry Needling (DN)? o DN is a physical therapy technique used to treat muscle pain and dysfunction. Specifically, DN helps deactivate muscle trigger points (muscle knots).  o A thin filiform needle is used to penetrate the skin and stimulate the underlying trigger point. The goal is for a local twitch response (LTR) to occur and for the trigger point to relax. No medication of any kind is injected during the procedure.   . What Does Trigger Point Dry Needling Feel Like?  o The procedure feels different for each individual patient. Some patients report that they do not actually feel the needle enter the skin and overall the process is not painful. Very mild bleeding may occur. However, many  patients feel a deep cramping in the muscle in which the needle was inserted. This is the local twitch response.   Marland Kitchen. How Will I feel after the treatment? o Soreness is normal, and the onset of soreness may not occur for a few hours. Typically this soreness does not last longer than two days.  o Bruising is uncommon, however; ice can be used to decrease any possible bruising.  o In rare cases feeling tired or nauseous after the treatment is normal. In addition, your symptoms may get worse before they get better, this period will typically not last longer than 24 hours.   . What Can I do After My Treatment? o Increase your hydration by drinking more water for the next 24 hours. o You may place ice or heat on the areas treated that have become sore, however, do not use heat on inflamed or bruised areas. Heat often brings more relief post needling. o You can continue your regular activities, but vigorous activity is not recommended initially after the treatment for 24 hours. o DN is best combined with other physical therapy such as strengthening, stretching, and other therapies.   Solon PalmJulie Kianni Lheureux, PT

## 2015-10-14 ENCOUNTER — Ambulatory Visit: Payer: Federal, State, Local not specified - PPO | Admitting: Physical Therapy

## 2015-10-14 DIAGNOSIS — M25562 Pain in left knee: Secondary | ICD-10-CM

## 2015-10-14 NOTE — Therapy (Signed)
[facility] [address] [place], [place], [address] Phone: [phone]   Fax:  [phone]  Physical Therapy Treatment  Patient Details  Name: Rita Barry MRN: [id number] Date of Birth: [birth year] Referring Provider: Jodene Nam PA-C  Encounter Date: 10/14/2015      PT End of Session - 10/14/15 1033    Visit Number 2   Number of Visits 8   Date for PT Re-Evaluation 11/06/15   PT Start Time 1033   PT Stop Time 1120   PT Time Calculation (min) 47 min   Activity Tolerance Patient tolerated treatment well   Behavior During Therapy [facility] for tasks assessed/performed      Past Medical History  Diagnosis Date  . Fibromyalgia     Questionable  . Anxiety   . Depression     Past Surgical History  Procedure Laterality Date  . Shoulder surgery      Left  . Shoulder surgery Left     x2 surgeries  . Colonoscopy N/A 09/01/2015    Procedure: COLONOSCOPY;  Surgeon: West Bali, MD;  [facility];  Service: Endoscopy;  Laterality: N/A;  9:45 AM    There were no vitals filed for this visit.      Subjective Assessment - 10/14/15 1035    Subjective No new c/o pain. No change since last visit. She is compliant with HEP.   Pertinent History HTN, fibromyalgia   How long can you sit comfortably? sit to stand   Patient Stated Goals be able to climb stairs, ride bike and exercise more   Currently in Pain? Yes   Pain Score 8                          [facility] Adult PT Treatment/Exercise - 10/14/15 0001    Knee/Hip Exercises: Supine   Quad Sets Strengthening;Left;10 reps  5 sec hold with some pain by end.   Short Facilities manager Leg Raises Strengthening;Left;20 reps   Straight Leg Raise with External Rotation Strengthening;Left;20 reps   Modalities   Modalities Ultrasound   Ultrasound   Ultrasound Location L lateral knee    Ultrasound Parameters 1.5 w/cm2 3.3 mhz  cont x 10 min   Ultrasound Goals Pain   Manual Therapy   Manual Therapy Soft tissue mobilization;Myofascial release   Soft tissue mobilization to left knee lateral joint line area including cross friction massage; L VLO, L peroneals and lateral gastroc   Myofascial Release L ITB                PT Education - 10/14/15 1136    Education provided Yes   Education Details HEP and discussed rolling on foam roller or using rolling pin for MFR   Person(s) Educated Patient   Methods Explanation;Demonstration;Handout   Comprehension Verbalized understanding;Returned demonstration             PT Long Term Goals - 10/09/15 1239    PT LONG TERM GOAL #1   Title I with HEP   Time 4   Period Weeks   Status New   PT LONG TERM GOAL #2   Title Decreased pain with ADLs including stairs by 50% or more.   Time 4   Period Weeks   Status New   PT LONG TERM GOAL #3   Title Patient able to peform sit to stand without pain   Time 4   Period  Weeks   Status New               Plan - 10/14/15 1137    Clinical Impression Statement Patient did well with TE today with only mild pain reported. She continues to have marked L ITB tightness and also L calf tightness and trigger points. Goals are ongoing as only second visit.   Rehab Potential Good   PT Frequency 2x / week   PT Duration 4 weeks   PT Treatment/Interventions ADLs/Self Care Home Management;Moist Heat;Therapeutic exercise;Ultrasound;Neuromuscular re-education;Patient/family education;Manual techniques;Taping;Dry needling   PT Next Visit Plan Manual to L ITB; painfree quad strengthening including eccentric. DN L ITB if indicated.   PT Home Exercise Plan QS, SAQ, SLR and with ER      Patient will benefit from skilled therapeutic intervention in order to improve the following deficits and impairments:  Pain, Decreased activity tolerance, Impaired flexibility, Decreased strength  Visit Diagnosis: Pain in left  knee     Problem List Patient Active Problem List   Diagnosis Date Noted  . Special screening for malignant neoplasms, colon   . Allergic rhinitis 08/25/2015  . Pain medication agreement signed 08/01/2015  . Opioid dependence (HCC) 08/01/2015  . Vitamin D deficiency 08/23/2014  . Hyperlipidemia 08/23/2014  . GAD (generalized anxiety disorder) 09/18/2013  . Benign paroxysmal positional vertigo 03/02/2013  . General counseling for prescription of oral contraceptives 01/09/2013  . Dysmenorrhea 01/09/2013  . Depression 08/08/2012  . Fibromyalgia 08/08/2012  . Weight gain 08/08/2012  . Elevated BP 08/08/2012    Solon PalmJulie Latarsha Zani PT  10/14/2015, 12:56 PM  [facility] [address] [place], [place], [address] Phone: [phone]   Fax:  [phone]  Name: Rita Barry MRN: [id number] Date of Birth: [birth year]

## 2015-10-14 NOTE — Patient Instructions (Signed)
  Quad Set   With other leg bent, foot flat, slowly tighten muscles on thigh of straight leg while counting out loud to _5__. Repeat with other leg. Repeat _10-20__ times. Do 1-2__ sessions per day.   Short Arc [employer] Inc[employer]ad  Place a large can or rolled towel under leg. Straighten knee and leg. Hold _5-10 seconds. Repeat with other leg. Repeat __10-20_ times. Do _1-2_ sessions per day.  Hip Flexion / Knee Extension: Straight-Leg Raise (Eccentric)    Lie on back. Lift leg with knee straight. Slowly lower leg for 3-5 seconds. _10__ reps per set, _3__ sets. 1-2 x per day.  Lower like elevator, stopping at each floor. Add ___ lbs when you achieve ___ repetitions. Rest on elbows. Rest on straight arms.   ALSO DO WITH FOOT ROTATED Lawson FiscalOUTWARD  Diante Barley, PT 10/14/2015 11:20 AM [facility] [address] [place], [place], [address] Phone: [phone]   Fax:  [phone]

## 2015-10-16 ENCOUNTER — Ambulatory Visit: Payer: Federal, State, Local not specified - PPO | Admitting: Physical Therapy

## 2015-10-16 DIAGNOSIS — M25562 Pain in left knee: Secondary | ICD-10-CM | POA: Diagnosis not present

## 2015-10-16 NOTE — Therapy (Signed)
[facility] [address] [place], [place], [address] Phone: [phone]   Fax:  [phone]  Physical Therapy Treatment  Patient Details  Name: Rita Barry MRN: [id number] Date of Birth: 06/05/1965 Referring Provider: Jodene Nam PA-C  Encounter Date: 10/16/2015      PT End of Session - 10/16/15 1032    Visit Number 3   Number of Visits 8   Date for PT Re-Evaluation 11/06/15   PT Start Time 1032   PT Stop Time 1114   PT Time Calculation (min) 42 min   Activity Tolerance Patient tolerated treatment well   Behavior During Therapy [facility] for tasks assessed/performed      Past Medical History  Diagnosis Date  . Fibromyalgia     Questionable  . Anxiety   . Depression     Past Surgical History  Procedure Laterality Date  . Shoulder surgery      Left  . Shoulder surgery Left     x2 surgeries  . Colonoscopy N/A 09/01/2015    Procedure: COLONOSCOPY;  Surgeon: West Bali, MD;  [facility];  Service: Endoscopy;  Laterality: N/A;  9:45 AM    There were no vitals filed for this visit.      Subjective Assessment - 10/16/15 1033    Subjective It is not worse and may be slightly better.   Pertinent History HTN, fibromyalgia   How long can you sit comfortably? sit to stand   Patient Stated Goals be able to climb stairs, ride bike and exercise more   Currently in Pain? Yes   Pain Score 6    Pain Location Knee   Pain Orientation Left                         [facility] Adult PT Treatment/Exercise - 10/16/15 0001    Knee/Hip Exercises: Stretches   Gastroc Stretch 2 reps;30 seconds;Both   Knee/Hip Exercises: Standing   Functional Squat 20 reps  Eccentric with heels on rockerboard   Knee/Hip Exercises: Seated   Long Arc Quad Strengthening;Left;3 sets;10 reps  with slow eccentric lowering   Long Arc Quad Weight 4 lbs.   Knee/Hip Exercises: Supine   Straight Leg Raises Strengthening;Left;10 reps   4# with eccentric lowering   Modalities   Modalities Ultrasound   Ultrasound   Ultrasound Location L lateral knee at joint line   Ultrasound Parameters 1.5 w/cm2 3.3 mhz cont x 10 min   Ultrasound Goals Pain   Manual Therapy   Manual Therapy Soft tissue mobilization   Soft tissue mobilization to left knee lateral joint line area including cross friction massage                PT Education - 10/16/15 1105    Education provided Yes   Education Details HEP   Person(s) Educated Patient   Methods Explanation;Demonstration;Handout   Comprehension Verbalized understanding;Returned demonstration             PT Long Term Goals - 10/09/15 1239    PT LONG TERM GOAL #1   Title I with HEP   Time 4   Period Weeks   Status New   PT LONG TERM GOAL #2   Title Decreased pain with ADLs including stairs by 50% or more.   Time 4   Period Weeks   Status New   PT LONG TERM GOAL #3   Title Patient able to peform sit to stand without  pain   Time 4   Period Weeks   Status New               Plan - 10/16/15 1247    Clinical Impression Statement Patient tolerated eccentric strengthening without pain. Less pain with STW to lateral joint line.   PT Next Visit Plan Manual to L ITB; painfree quad strengthening including eccentric. Continue US.      Patient will benefit from skilled therapeutic intervention in order to improve the following deficits and impairments:  Pain, Decreased activity tolerance, Impaired flexibility, Decreased strength  Visit Diagnosis: Pain in left knee     Problem List Patient Active Problem List   Diagnosis Date Noted  . Special screening for malignant neoplasms, colon   . Allergic rhinitis 08/25/2015  . Pain medication agreement signed 08/01/2015  . Opioid dependence (HCC) 08/01/2015  . Vitamin D deficiency 08/23/2014  . Hyperlipidemia 08/23/2014  . GAD (generalized anxiety disorder) 09/18/2013  . Benign paroxysmal positional vertigo  03/02/2013  . General counseling for prescription of oral contraceptives 01/09/2013  . Dysmenorrhea 01/09/2013  . Depression 08/08/2012  . Fibromyalgia 08/08/2012  . Weight gain 08/08/2012  . Elevated BP 08/08/2012    Solon PalmJulie Chudney Scheffler PT  10/16/2015, 12:49 PM  [facility] [address] [place], [place], [address] Phone: [phone]   Fax:  [phone]  Name: Rita Barry MRN: [id number] Date of Birth: 13-Feb-1966

## 2015-10-16 NOTE — Patient Instructions (Signed)
Achilles Tendon Stretch   Stand with hands supported on wall, elbows slightly bent, feet parallel and both heels on floor, front knee bent, back knee straight. Slowly relax back knee until a stretch is felt in achilles tendon. Hold __30-60_ seconds. Repeat with leg positions switched. Repeat with back knee slightly bent.  Rita PalmJulie Chrystian Ressler, PT 10/16/2015 11:04 AM [facility] [address] [place], [place], [address] Phone: [phone]   Fax:  [phone]

## 2015-10-21 ENCOUNTER — Ambulatory Visit: Payer: Federal, State, Local not specified - PPO | Admitting: Physical Therapy

## 2015-10-21 DIAGNOSIS — M25562 Pain in left knee: Secondary | ICD-10-CM | POA: Diagnosis not present

## 2015-10-21 NOTE — Therapy (Signed)
[facility] [address] [place], [place], [address] Phone: [phone]   Fax:  [phone]  Physical Therapy Treatment  Patient Details  Name: Rita Barry MRN: [id number] Date of Birth: 06-Apr-1966 Referring Provider: Jodene Nam PA-C  Encounter Date: 10/21/2015      PT End of Session - 10/21/15 1036    Visit Number 4   Number of Visits 8   Date for PT Re-Evaluation 11/06/15   PT Start Time 1030   PT Stop Time 1117   PT Time Calculation (min) 47 min   Activity Tolerance Patient tolerated treatment well   Behavior During Therapy [facility] for tasks assessed/performed      Past Medical History  Diagnosis Date  . Fibromyalgia     Questionable  . Anxiety   . Depression     Past Surgical History  Procedure Laterality Date  . Shoulder surgery      Left  . Shoulder surgery Left     x2 surgeries  . Colonoscopy N/A 09/01/2015    Procedure: COLONOSCOPY;  Surgeon: West Bali, MD;  [facility];  Service: Endoscopy;  Laterality: N/A;  9:45 AM    There were no vitals filed for this visit.      Subjective Assessment - 10/21/15 1037    Subjective I tried to go up a step and it feels stronger and the pain is less.   Pertinent History HTN, fibromyalgia   How long can you sit comfortably? sit to stand   Patient Stated Goals be able to climb stairs, ride bike and exercise more   Currently in Pain? Yes   Pain Score 5    Pain Location Knee   Pain Orientation Left   Pain Descriptors / Indicators Sharp   Pain Type Chronic pain                         [facility] Adult PT Treatment/Exercise - 10/21/15 0001    Knee/Hip Exercises: Aerobic   Elliptical L2 R2 x 10 min   Knee/Hip Exercises: Standing   Lateral Step Up 2 sets;10 reps;Hand Hold: 0;Step Height: 6"   Forward Step Up 2 sets;Hand Hold: 0;Step Height: 6";10 reps   Step Down 2 sets;10 reps;Hand Hold: 0;Step Height: 2"  Heel taps   Functional Squat 2  sets;20 reps  one set with red band around left thigh   Knee/Hip Exercises: Supine   Bridges with Clamshell Strengthening;Both;20 reps  red band   Knee/Hip Exercises: Sidelying   Clams 30 reps Bil with red band                PT Education - 10/21/15 1215    Education provided Yes   Education Details HEP   Person(s) Educated Patient   Methods Demonstration;Handout;Explanation   Comprehension Verbalized understanding;Returned demonstration             PT Long Term Goals - 10/21/15 1220    PT LONG TERM GOAL #1   Title I with HEP   Time 4   Period Weeks   Status On-going   PT LONG TERM GOAL #2   Title Decreased pain with ADLs including stairs by 50% or more.   Time 4   Period Weeks   Status On-going   PT LONG TERM GOAL #3   Title Patient able to peform sit to stand without pain   Period Weeks   Status On-going  Plan - 10/21/15 1216    Clinical Impression Statement Patient did very well today with strengthening reporting no pain. She responded well to NME using red tband for her hip with functional squats. She continues to have pain with stairs, but in the clinic we could only reproduce with reverse step ups.   Rehab Potential Good   PT Frequency 2x / week   PT Duration 4 weeks   PT Treatment/Interventions ADLs/Self Care Home Management;Moist Heat;Therapeutic exercise;Ultrasound;Neuromuscular re-education;Patient/family education;Manual techniques;Taping;Dry needling   PT Next Visit Plan continue pain free quad strengthening, including hip strength. Progress step ups/downs as tolerated.   PT Home Exercise Plan QS, SAQ, SLR and with ER, squats with red band, step downs (heel taps), bridging and clams with band      Patient will benefit from skilled therapeutic intervention in order to improve the following deficits and impairments:  Pain, Decreased activity tolerance, Impaired flexibility, Decreased strength  Visit Diagnosis: Pain in left  knee     Problem List Patient Active Problem List   Diagnosis Date Noted  . Special screening for malignant neoplasms, colon   . Allergic rhinitis 08/25/2015  . Pain medication agreement signed 08/01/2015  . Opioid dependence (HCC) 08/01/2015  . Vitamin D deficiency 08/23/2014  . Hyperlipidemia 08/23/2014  . GAD (generalized anxiety disorder) 09/18/2013  . Benign paroxysmal positional vertigo 03/02/2013  . General counseling for prescription of oral contraceptives 01/09/2013  . Dysmenorrhea 01/09/2013  . Depression 08/08/2012  . Fibromyalgia 08/08/2012  . Weight gain 08/08/2012  . Elevated BP 08/08/2012    Solon PalmJulie Braelin Brosch PT  10/21/2015, 12:21 PM  [facility] [address] [place], [place], [address] Phone: [phone]   Fax:  [phone]  Name: Rita Barry MRN: [id number] Date of Birth: 1965-08-05

## 2015-10-23 ENCOUNTER — Ambulatory Visit: Payer: Federal, State, Local not specified - PPO | Admitting: Physical Therapy

## 2015-10-23 DIAGNOSIS — M25562 Pain in left knee: Secondary | ICD-10-CM

## 2015-10-23 NOTE — Therapy (Addendum)
[facility] [place], [place], [address] Phone: [phone]   Fax:  [phone]  Physical Therapy Treatment  Patient Details  Name: MERIEL KELLIHER MRN: [id number] Date of Birth: October 05, 1965 Referring Provider: Gerrit Halls PA-C  Encounter Date: 10/23/2015    Past Medical History:  Diagnosis Date  . Anxiety   . Depression   . Fibromyalgia    Questionable    Past Surgical History:  Procedure Laterality Date  . COLONOSCOPY N/A 09/01/2015   Procedure: COLONOSCOPY;  Surgeon: Danie Binder, MD;  [facility];  Service: Endoscopy;  Laterality: N/A;  9:45 AM  . SHOULDER SURGERY     Left  . SHOULDER SURGERY Left    x2 surgeries    There were no vitals filed for this visit.                                    PT Long Term Goals - 10/23/15 1127      PT LONG TERM GOAL #1   Title I with HEP   Time 4   Period Weeks   Status Achieved     PT LONG TERM GOAL #2   Title Decreased pain with ADLs including stairs by 50% or more.   Time 4   Period Weeks   Status Achieved     PT LONG TERM GOAL #3   Title Patient able to peform sit to stand without pain   Baseline Patient reports at least 50% improvement   Time 4   Period Weeks   Status On-going             Patient will benefit from skilled therapeutic intervention in order to improve the following deficits and impairments:  Pain, Decreased activity tolerance, Impaired flexibility, Decreased strength  Visit Diagnosis: Pain in left knee     Problem List Patient Active Problem List   Diagnosis Date Noted  . Lymphadenopathy, cervical 02/21/2016  . Esophageal dysphagia 02/21/2016  . Essential hypertension 11/26/2015  . Special screening for malignant neoplasms, colon   . Allergic rhinitis 08/25/2015  . Pain medication agreement signed 08/01/2015  . Opioid dependence ([place]) 08/01/2015  . Vitamin D deficiency  08/23/2014  . Hyperlipidemia 08/23/2014  . GAD (generalized anxiety disorder) 09/18/2013  . Benign paroxysmal positional vertigo 03/02/2013  . General counseling for prescription of oral contraceptives 01/09/2013  . Dysmenorrhea 01/09/2013  . Depression 08/08/2012  . Fibromyalgia 08/08/2012  . Weight gain 08/08/2012    Madelyn Flavors PT  03/30/2016, 10:59 AM  [facility] [address] [place], [place], [address] Phone: [phone]   Fax:  [phone]  Name: PANG ROBERS MRN: [id number] Date of Birth: Sep 14, 1965  PHYSICAL THERAPY DISCHARGE SUMMARY  Visits from Start of Care: 5.  Current functional level related to goals / functional outcomes: Please see above.   Remaining deficits: Continued knee pain with sit to stand transitions.   Education / Equipment: HEP. Plan: Patient agrees to discharge.  Patient goals were partially met. Patient is being discharged due to being pleased with the current functional level.  ?????         Mali Applegate MPT

## 2015-10-24 DIAGNOSIS — M228X2 Other disorders of patella, left knee: Secondary | ICD-10-CM | POA: Diagnosis not present

## 2015-10-24 DIAGNOSIS — M1712 Unilateral primary osteoarthritis, left knee: Secondary | ICD-10-CM | POA: Diagnosis not present

## 2015-11-26 ENCOUNTER — Encounter: Payer: Self-pay | Admitting: Family

## 2015-11-26 ENCOUNTER — Ambulatory Visit (INDEPENDENT_AMBULATORY_CARE_PROVIDER_SITE_OTHER): Payer: Federal, State, Local not specified - PPO | Admitting: Family

## 2015-11-26 VITALS — BP 135/93 | HR 78 | Temp 98.0°F | Ht 67.0 in | Wt 180.2 lb

## 2015-11-26 DIAGNOSIS — M797 Fibromyalgia: Secondary | ICD-10-CM

## 2015-11-26 DIAGNOSIS — E559 Vitamin D deficiency, unspecified: Secondary | ICD-10-CM

## 2015-11-26 DIAGNOSIS — F1120 Opioid dependence, uncomplicated: Secondary | ICD-10-CM | POA: Diagnosis not present

## 2015-11-26 DIAGNOSIS — E785 Hyperlipidemia, unspecified: Secondary | ICD-10-CM

## 2015-11-26 DIAGNOSIS — J309 Allergic rhinitis, unspecified: Secondary | ICD-10-CM

## 2015-11-26 DIAGNOSIS — R030 Elevated blood-pressure reading, without diagnosis of hypertension: Secondary | ICD-10-CM | POA: Diagnosis not present

## 2015-11-26 DIAGNOSIS — F411 Generalized anxiety disorder: Secondary | ICD-10-CM | POA: Diagnosis not present

## 2015-11-26 DIAGNOSIS — F32A Depression, unspecified: Secondary | ICD-10-CM

## 2015-11-26 DIAGNOSIS — Z0289 Encounter for other administrative examinations: Secondary | ICD-10-CM

## 2015-11-26 DIAGNOSIS — Z79899 Other long term (current) drug therapy: Secondary | ICD-10-CM | POA: Diagnosis not present

## 2015-11-26 DIAGNOSIS — IMO0001 Reserved for inherently not codable concepts without codable children: Secondary | ICD-10-CM

## 2015-11-26 DIAGNOSIS — R002 Palpitations: Secondary | ICD-10-CM | POA: Diagnosis not present

## 2015-11-26 DIAGNOSIS — F329 Major depressive disorder, single episode, unspecified: Secondary | ICD-10-CM | POA: Diagnosis not present

## 2015-11-26 DIAGNOSIS — I10 Essential (primary) hypertension: Secondary | ICD-10-CM | POA: Diagnosis not present

## 2015-11-26 MED ORDER — TRAMADOL HCL 50 MG PO TABS: ORAL_TABLET | ORAL | 2 refills | Status: DC

## 2015-11-26 MED ORDER — TRAMADOL HCL 50 MG PO TABS: ORAL_TABLET | ORAL | 0 refills | Status: DC

## 2015-11-26 MED ORDER — HYDROCHLOROTHIAZIDE 12.5 MG PO TABS: 12.5000 mg | ORAL_TABLET | Freq: Every day | ORAL | 3 refills | Status: DC

## 2015-11-26 NOTE — Addendum Note (Signed)
Addended by: [name] A on: 11/26/2015 03:27 PM   Modules accepted: Orders

## 2015-11-26 NOTE — Progress Notes (Signed)
Subjective:    Patient ID: Rita Barry, female    DOB: [birth year], 50 y.o.   MRN: [id number]   PT presents to the office today for chronic follow up. PT takes Ultram  BID for Fibromyalgia and states this seems to help a great deal. PT's BP is elevated today and she states she is having intermittent palpitations.    Medication Refill  Pertinent negatives include no headaches or neck pain.  Knee Pain   The incident occurred more than 1 week ago. Injury mechanism: Pt knelt down and heard a "popping noise" The pain is present in the left knee. The quality of the pain is described as aching. The pain is at a severity of 7/10. The pain is mild. The pain has been fluctuating since onset. Associated symptoms include a loss of motion. Pertinent negatives include no muscle weakness or tingling. She reports no foreign bodies present. The symptoms are aggravated by movement. She has tried acetaminophen, rest, non-weight bearing and ice for the symptoms. The treatment provided mild relief.  Hyperlipidemia  This is a chronic problem. The current episode started more than 1 year ago. The problem is uncontrolled. Recent lipid tests were reviewed and are high. Exacerbating diseases include obesity. Pertinent negatives include no shortness of breath. Current antihyperlipidemic treatment includes diet change. The current treatment provides mild improvement of lipids. Risk factors for coronary artery disease include dyslipidemia, obesity and a sedentary lifestyle.  Depression       The patient presents with depression.  This is a chronic problem.  The current episode started more than 1 year ago.   The onset quality is gradual.   The problem occurs rarely.  The problem has been waxing and waning since onset.  Associated symptoms include no hopelessness, does not have insomnia, not irritable, no body aches, no headaches, not sad and no suicidal ideas.  Past treatments include SSRIs - Selective serotonin reuptake  inhibitors.  Compliance with treatment is good.  Previous treatment provided significant relief.  Past medical history includes anxiety and depression.   Anxiety  Presents for follow-up visit. Onset was more than 5 years ago. The problem has been waxing and waning. Patient reports no dizziness, excessive worry, insomnia, nervous/anxious behavior, palpitations, shortness of breath or suicidal ideas. Symptoms occur rarely.   Her past medical history is significant for anxiety/panic attacks and depression. Past treatments include SSRIs. The treatment provided significant relief. Compliance with prior treatments has been good.  Hypertension  This is a new problem. The current episode started more than 1 month ago. The problem has been waxing and waning since onset. The problem is uncontrolled. Associated symptoms include anxiety. Pertinent negatives include no headaches, neck pain, palpitations, peripheral edema or shortness of breath. Risk factors for coronary artery disease include dyslipidemia, sedentary lifestyle and obesity. Past treatments include nothing. The current treatment provides no improvement. Compliance problems include diet.  There is no history of CAD/MI, CVA or heart failure.      Review of Systems  Constitutional: Negative.   HENT: Negative.   Eyes: Negative.   Respiratory: Negative.  Negative for shortness of breath.   Cardiovascular: Negative.  Negative for palpitations.  Gastrointestinal: Negative.   Endocrine: Negative.   Genitourinary: Negative.   Musculoskeletal: Negative.  Negative for neck pain.  Neurological: Negative.  Negative for dizziness, tingling and headaches.  Hematological: Negative.   Psychiatric/Behavioral: Positive for depression. Negative for suicidal ideas. The patient is not nervous/anxious and does not have insomnia.  All other systems reviewed and are negative.      Objective:   Physical Exam  Constitutional: She is oriented to person, place,  and time. She appears well-developed and well-nourished. She is not irritable. No distress.  HENT:  Head: Normocephalic and atraumatic.  Right Ear: External ear normal.  Left Ear: External ear normal.  Nose: Nose normal.  Mouth/Throat: Oropharynx is clear and moist.  Eyes: Pupils are equal, round, and reactive to light.  Neck: Normal range of motion. Neck supple. No thyromegaly present.  Cardiovascular: Normal rate, regular rhythm, normal heart sounds and intact distal pulses.   No murmur heard. Pulmonary/Chest: Effort normal and breath sounds normal. No respiratory distress. She has no wheezes.  Abdominal: Soft. Bowel sounds are normal. She exhibits no distension. There is no tenderness.  Genitourinary:  Genitourinary Comments:    Musculoskeletal: Normal range of motion. She exhibits no edema or tenderness.  Neurological: She is alert and oriented to person, place, and time. She has normal reflexes. No cranial nerve deficit.  Skin: Skin is warm and dry.  Psychiatric: She has a normal mood and affect. Her behavior is normal. Judgment and thought content normal.  Vitals reviewed.    BP (!) 135/93   Pulse 78   Temp 98 F (36.7 C) (Oral)   Ht 5' 7"  (1.702 m)   Wt 180 lb 3.2 oz (81.7 kg)   BMI 28.22 kg/m      Assessment & Plan:  1. Fibromyalgia - traMADol (ULTRAM) 50 MG tablet; TAKE 1 TABLET BY MOUTH EVERY 6 HOURS AS NEEDED  Dispense: 60 tablet; Refill: 0 - CMP14+EGFR  2. Pain medication agreement signed - traMADol (ULTRAM) 50 MG tablet; TAKE 1 TABLET BY MOUTH EVERY 6 HOURS AS NEEDED  Dispense: 60 tablet; Refill: 0 - CMP14+EGFR  3. Uncomplicated opioid dependence (HCC) - traMADol (ULTRAM) 50 MG tablet; TAKE 1 TABLET BY MOUTH EVERY 6 HOURS AS NEEDED  Dispense: 60 tablet; Refill: 0 - CMP14+EGFR  4. Allergic rhinitis, unspecified allergic rhinitis type - CMP14+EGFR  5. Depression - CMP14+EGFR  6. GAD (generalized anxiety disorder) - CMP14+EGFR  7. Hyperlipidemia -  CMP14+EGFR - Lipid panel  8. Vitamin D deficiency - CMP14+EGFR  9. Palpitations - CMP14+EGFR - EKG 12-Lead  10. Elevated BP - hydrochlorothiazide (HYDRODIURIL) 12.5 MG tablet; Take 1 tablet (12.5 mg total) by mouth daily.  Dispense: 90 tablet; Refill: 3  11. Essential hypertension - hydrochlorothiazide (HYDRODIURIL) 12.5 MG tablet; Take 1 tablet (12.5 mg total) by mouth daily.  Dispense: 90 tablet; Refill: 3   Continue all meds Labs pending Health Maintenance reviewed Diet and exercise encouraged RTO 2 week recheck HTN  [name], FNP

## 2015-11-26 NOTE — Patient Instructions (Addendum)
Palpitations A palpitation is the feeling that your heartbeat is irregular or is faster than normal. It may feel like your heart is fluttering or skipping a beat. Palpitations are usually not a serious problem. However, in some cases, you may need further medical evaluation. CAUSES  Palpitations can be caused by:  Smoking.  Caffeine or other stimulants, such as diet pills or energy drinks.  Alcohol.  Stress and anxiety.  Strenuous physical activity.  Fatigue.  Certain medicines.  Heart disease, especially if you have a history of irregular heart rhythms (arrhythmias), such as atrial fibrillation, atrial flutter, or supraventricular tachycardia.  An improperly working pacemaker or defibrillator. DIAGNOSIS  To find the cause of your palpitations, your health care provider will take your medical history and perform a physical exam. Your health care provider may also have you take a test called an ambulatory electrocardiogram (ECG). An ECG records your heartbeat patterns over a 24-hour period. You may also have other tests, such as:  Transthoracic echocardiogram (TTE). During echocardiography, sound waves are used to evaluate how blood flows through your heart.  Transesophageal echocardiogram (TEE).  Cardiac monitoring. This allows your health care provider to monitor your heart rate and rhythm in real time.  Holter monitor. This is a portable device that records your heartbeat and can help diagnose heart arrhythmias. It allows your health care provider to track your heart activity for several days, if needed.  Stress tests by exercise or by giving medicine that makes the heart beat faster. TREATMENT  Treatment of palpitations depends on the cause of your symptoms and can vary greatly. Most cases of palpitations do not require any treatment other than time, relaxation, and monitoring your symptoms. Other causes, such as atrial fibrillation, atrial flutter, or supraventricular  tachycardia, usually require further treatment. HOME CARE INSTRUCTIONS   Avoid:  Caffeinated coffee, tea, soft drinks, diet pills, and energy drinks.  Chocolate.  Alcohol.  Stop smoking if you smoke.  Reduce your stress and anxiety. Things that can help you relax include:  A method of controlling things in your body, such as your heartbeats, with your mind (biofeedback).  Yoga.  Meditation.  Physical activity such as swimming, jogging, or walking.  Get plenty of rest and sleep. SEEK MEDICAL CARE IF:   You continue to have a fast or irregular heartbeat beyond 24 hours.  Your palpitations occur more often. SEEK IMMEDIATE MEDICAL CARE IF:  You have chest pain or shortness of breath.  You have a severe headache.  You feel dizzy or you faint. MAKE SURE YOU:  Understand these instructions.  Will watch your condition.  Will get help right away if you are not doing well or get worse.   This information is not intended to replace advice given to you by your health care provider. Make sure you discuss any questions you have with your health care provider.   Document Released: 04/16/2000 Document Revised: 04/24/2013 Document Reviewed: 06/18/2011 Elsevier Interactive Patient Education 2016 [employer].  Hypertension Hypertension, commonly called high blood pressure, is when the force of blood pumping through your arteries is too strong. Your arteries are the blood vessels that carry blood from your heart throughout your body. A blood pressure reading consists of a higher number over a lower number, such as 110/72. The higher number (systolic) is the pressure inside your arteries when your heart pumps. The lower number (diastolic) is the pressure inside your arteries when your heart relaxes. Ideally you want your blood pressure below 120/80. Hypertension  forces your heart to work harder to pump blood. Your arteries may become narrow or stiff. Having untreated or uncontrolled  hypertension can cause heart attack, stroke, kidney disease, and other problems. RISK FACTORS Some risk factors for high blood pressure are controllable. Others are not.  Risk factors you cannot control include:   Race. You may be at higher risk if you are African American.  Age. Risk increases with age.  Gender. Men are at higher risk than women before age 64 years. After age 32, women are at higher risk than men. Risk factors you can control include:  Not getting enough exercise or physical activity.  Being overweight.  Getting too much fat, sugar, calories, or salt in your diet.  Drinking too much alcohol. SIGNS AND SYMPTOMS Hypertension does not usually cause signs or symptoms. Extremely high blood pressure (hypertensive crisis) may cause headache, anxiety, shortness of breath, and nosebleed. DIAGNOSIS To check if you have hypertension, your health care provider will measure your blood pressure while you are seated, with your arm held at the level of your heart. It should be measured at least twice using the same arm. Certain conditions can cause a difference in blood pressure between your right and left arms. A blood pressure reading that is higher than normal on one occasion does not mean that you need treatment. If it is not clear whether you have high blood pressure, you may be asked to return on a different day to have your blood pressure checked again. Or, you may be asked to monitor your blood pressure at home for 1 or more weeks. TREATMENT Treating high blood pressure includes making lifestyle changes and possibly taking medicine. Living a healthy lifestyle can help lower high blood pressure. You may need to change some of your habits. Lifestyle changes may include:  Following the DASH diet. This diet is high in fruits, vegetables, and whole grains. It is low in salt, red meat, and added sugars.  Keep your sodium intake below 2,300 mg per day.  Getting at least 30-45  minutes of aerobic exercise at least 4 times per week.  Losing weight if necessary.  Not smoking.  Limiting alcoholic beverages.  Learning ways to reduce stress. Your health care provider may prescribe medicine if lifestyle changes are not enough to get your blood pressure under control, and if one of the following is true:  You are 16-31 years of age and your systolic blood pressure is above 140.  You are 77 years of age or older, and your systolic blood pressure is above 150.  Your diastolic blood pressure is above 90.  You have diabetes, and your systolic blood pressure is over 140 or your diastolic blood pressure is over 90.  You have kidney disease and your blood pressure is above 140/90.  You have heart disease and your blood pressure is above 140/90. Your personal target blood pressure may vary depending on your medical conditions, your age, and other factors. HOME CARE INSTRUCTIONS  Have your blood pressure rechecked as directed by your health care provider.   Take medicines only as directed by your health care provider. Follow the directions carefully. Blood pressure medicines must be taken as prescribed. The medicine does not work as well when you skip doses. Skipping doses also puts you at risk for problems.  Do not smoke.   Monitor your blood pressure at home as directed by your health care provider. SEEK MEDICAL CARE IF:   You think you are having  a reaction to medicines taken.  You have recurrent headaches or feel dizzy.  You have swelling in your ankles.  You have trouble with your vision. SEEK IMMEDIATE MEDICAL CARE IF:  You develop a severe headache or confusion.  You have unusual weakness, numbness, or feel faint.  You have severe chest or abdominal pain.  You vomit repeatedly.  You have trouble breathing. MAKE SURE YOU:   Understand these instructions.  Will watch your condition.  Will get help right away if you are not doing well or get  worse.   This information is not intended to replace advice given to you by your health care provider. Make sure you discuss any questions you have with your health care provider.   Document Released: 04/19/2005 Document Revised: 09/03/2014 Document Reviewed: 02/09/2013 Elsevier Interactive Patient Education 2016 [employer] Inc[employer] DASH Eating Plan DASH stands for "Dietary Approaches to Stop Hypertension." The DASH eating plan is a healthy eating plan that has been shown to reduce high blood pressure (hypertension). Additional health benefits may include reducing the risk of type 2 diabetes mellitus, heart disease, and stroke. The DASH eating plan may also help with weight loss. WHAT DO I NEED TO KNOW ABOUT THE DASH EATING PLAN? For the DASH eating plan, you will follow these general guidelines:  Choose foods with a percent daily value for sodium of less than 5% (as listed on the food label).  Use salt-free seasonings or herbs instead of table salt or sea salt.  Check with your health care provider or pharmacist before using salt substitutes.  Eat lower-sodium products, often labeled as "lower sodium" or "no salt added."  Eat fresh foods.  Eat more vegetables, fruits, and low-fat dairy products.  Choose whole grains. Look for the word "whole" as the first word in the ingredient list.  Choose fish and skinless chicken or [place] more often than red meat. Limit fish, poultry, and meat to 6 oz (170 g) each day.  Limit sweets, desserts, sugars, and sugary drinks.  Choose heart-healthy fats.  Limit cheese to 1 oz (28 g) per day.  Eat more home-cooked food and less restaurant, buffet, and fast food.  Limit fried foods.  Cook foods using methods other than frying.  Limit canned vegetables. If you do use them, rinse them well to decrease the sodium.  When eating at a restaurant, ask that your food be prepared with less salt, or no salt if possible. WHAT FOODS CAN I EAT? Seek help from a  dietitian for individual calorie needs. Grains Whole grain or whole wheat bread. Brown rice. Whole grain or whole wheat pasta. Quinoa, bulgur, and whole grain cereals. Low-sodium cereals. Corn or whole wheat flour tortillas. Whole grain cornbread. Whole grain crackers. Low-sodium crackers. Vegetables Fresh or frozen vegetables (raw, steamed, roasted, or grilled). Low-sodium or reduced-sodium tomato and vegetable juices. Low-sodium or reduced-sodium tomato sauce and paste. Low-sodium or reduced-sodium canned vegetables.  Fruits All fresh, canned (in natural juice), or frozen fruits. Meat and Other Protein Products Ground beef (85% or leaner), grass-fed beef, or beef trimmed of fat. Skinless chicken or [place]. Ground chicken or [place]. Pork trimmed of fat. All fish and seafood. Eggs. Dried beans, peas, or lentils. Unsalted nuts and seeds. Unsalted canned beans. Dairy Low-fat dairy products, such as skim or 1% milk, 2% or reduced-fat cheeses, low-fat ricotta or cottage cheese, or plain low-fat yogurt. Low-sodium or reduced-sodium cheeses. Fats and Oils Tub margarines without trans fats. Light or reduced-fat mayonnaise and salad dressings (reduced  sodium). Avocado. Safflower, olive, or canola oils. Natural peanut or almond butter. Other Unsalted popcorn and pretzels. The items listed above may not be a complete list of recommended foods or beverages. Contact your dietitian for more options. WHAT FOODS ARE NOT RECOMMENDED? Grains White bread. White pasta. White rice. Refined cornbread. Bagels and croissants. Crackers that contain trans fat. Vegetables Creamed or fried vegetables. Vegetables in a cheese sauce. Regular canned vegetables. Regular canned tomato sauce and paste. Regular tomato and vegetable juices. Fruits Dried fruits. Canned fruit in light or heavy syrup. Fruit juice. Meat and Other Protein Products Fatty cuts of meat. Ribs, chicken wings, bacon, sausage, bologna, salami,  chitterlings, fatback, hot dogs, bratwurst, and packaged luncheon meats. Salted nuts and seeds. Canned beans with salt. Dairy Whole or 2% milk, cream, half-and-half, and cream cheese. Whole-fat or sweetened yogurt. Full-fat cheeses or blue cheese. Nondairy creamers and whipped toppings. Processed cheese, cheese spreads, or cheese curds. Condiments Onion and garlic salt, seasoned salt, table salt, and sea salt. Canned and packaged gravies. Worcestershire sauce. Tartar sauce. Barbecue sauce. Teriyaki sauce. Soy sauce, including reduced sodium. Steak sauce. Fish sauce. Oyster sauce. Cocktail sauce. Horseradish. Ketchup and mustard. Meat flavorings and tenderizers. Bouillon cubes. Hot sauce. Tabasco sauce. Marinades. Taco seasonings. Relishes. Fats and Oils Butter, stick margarine, lard, shortening, ghee, and bacon fat. Coconut, palm kernel, or palm oils. Regular salad dressings. Other Pickles and olives. Salted popcorn and pretzels. The items listed above may not be a complete list of foods and beverages to avoid. Contact your dietitian for more information. WHERE CAN I FIND MORE INFORMATION? National Heart, Lung, and Blood Institute: [employer]   This information is not intended to replace advice given to you by your health care provider. Make sure you discuss any questions you have with your health care provider.   Document Released: 04/08/2011 Document Revised: 05/10/2014 Document Reviewed: 02/21/2013 Elsevier Interactive Patient Education [employer]

## 2015-11-27 LAB — CMP14+EGFR
ALT: 20 IU/L (ref 0–32)
AST: 24 IU/L (ref 0–40)
Albumin/Globulin Ratio: 2 (ref 1.2–2.2)
Albumin: 4.5 g/dL (ref 3.5–5.5)
Alkaline Phosphatase: 26 IU/L — ABNORMAL LOW (ref 39–117)
BUN/Creatinine Ratio: 25 — ABNORMAL HIGH (ref 9–23)
BUN: 18 mg/dL (ref 6–24)
Bilirubin Total: 0.4 mg/dL (ref 0.0–1.2)
CO2: 24 mmol/L (ref 18–29)
Calcium: 9.4 mg/dL (ref 8.7–10.2)
Chloride: 96 mmol/L (ref 96–106)
Creatinine, Ser: 0.71 mg/dL (ref 0.57–1.00)
GFR calc Af Amer: 115 mL/min/{1.73_m2} (ref >59)
GFR calc non Af Amer: 100 mL/min/{1.73_m2} (ref >59)
Globulin, Total: 2.3 g/dL (ref 1.5–4.5)
Glucose: 79 mg/dL (ref 65–99)
Potassium: 3.8 mmol/L (ref 3.5–5.2)
Sodium: 137 mmol/L (ref 134–144)
Total Protein: 6.8 g/dL (ref 6.0–8.5)

## 2015-11-27 LAB — LIPID PANEL
Chol/HDL Ratio: 3 ratio units (ref 0.0–4.4)
Cholesterol, Total: 217 mg/dL — ABNORMAL HIGH (ref 100–199)
HDL: 73 mg/dL (ref >39)
LDL Calculated: 108 mg/dL — ABNORMAL HIGH (ref 0–99)
Triglycerides: 179 mg/dL — ABNORMAL HIGH (ref 0–149)
VLDL Cholesterol Cal: 36 mg/dL (ref 5–40)

## 2015-12-11 ENCOUNTER — Encounter: Payer: Self-pay | Admitting: Family

## 2015-12-11 ENCOUNTER — Ambulatory Visit (INDEPENDENT_AMBULATORY_CARE_PROVIDER_SITE_OTHER): Payer: Federal, State, Local not specified - PPO | Admitting: Family

## 2015-12-11 VITALS — BP 117/74 | HR 78 | Temp 97.7°F | Ht 67.0 in | Wt 178.5 lb

## 2015-12-11 DIAGNOSIS — Z23 Encounter for immunization: Secondary | ICD-10-CM | POA: Diagnosis not present

## 2015-12-11 DIAGNOSIS — I10 Essential (primary) hypertension: Secondary | ICD-10-CM

## 2015-12-11 NOTE — Patient Instructions (Signed)
Hypertension Hypertension, commonly called high blood pressure, is when the force of blood pumping through your arteries is too strong. Your arteries are the blood vessels that carry blood from your heart throughout your body. A blood pressure reading consists of a higher number over a lower number, such as 110/72. The higher number (systolic) is the pressure inside your arteries when your heart pumps. The lower number (diastolic) is the pressure inside your arteries when your heart relaxes. Ideally you want your blood pressure below 120/80. Hypertension forces your heart to work harder to pump blood. Your arteries may become narrow or stiff. Having untreated or uncontrolled hypertension can cause heart attack, stroke, kidney disease, and other problems. RISK FACTORS Some risk factors for high blood pressure are controllable. Others are not.  Risk factors you cannot control include:   Race. You may be at higher risk if you are African American.  Age. Risk increases with age.  Gender. Men are at higher risk than women before age 45 years. After age 65, women are at higher risk than men. Risk factors you can control include:  Not getting enough exercise or physical activity.  Being overweight.  Getting too much fat, sugar, calories, or salt in your diet.  Drinking too much alcohol. SIGNS AND SYMPTOMS Hypertension does not usually cause signs or symptoms. Extremely high blood pressure (hypertensive crisis) may cause headache, anxiety, shortness of breath, and nosebleed. DIAGNOSIS To check if you have hypertension, your health care provider will measure your blood pressure while you are seated, with your arm held at the level of your heart. It should be measured at least twice using the same arm. Certain conditions can cause a difference in blood pressure between your right and left arms. A blood pressure reading that is higher than normal on one occasion does not mean that you need treatment. If  it is not clear whether you have high blood pressure, you may be asked to return on a different day to have your blood pressure checked again. Or, you may be asked to monitor your blood pressure at home for 1 or more weeks. TREATMENT Treating high blood pressure includes making lifestyle changes and possibly taking medicine. Living a healthy lifestyle can help lower high blood pressure. You may need to change some of your habits. Lifestyle changes may include:  Following the DASH diet. This diet is high in fruits, vegetables, and whole grains. It is low in salt, red meat, and added sugars.  Keep your sodium intake below 2,300 mg per day.  Getting at least 30-45 minutes of aerobic exercise at least 4 times per week.  Losing weight if necessary.  Not smoking.  Limiting alcoholic beverages.  Learning ways to reduce stress. Your health care provider may prescribe medicine if lifestyle changes are not enough to get your blood pressure under control, and if one of the following is true:  You are 18-59 years of age and your systolic blood pressure is above 140.  You are 60 years of age or older, and your systolic blood pressure is above 150.  Your diastolic blood pressure is above 90.  You have diabetes, and your systolic blood pressure is over 140 or your diastolic blood pressure is over 90.  You have kidney disease and your blood pressure is above 140/90.  You have heart disease and your blood pressure is above 140/90. Your personal target blood pressure may vary depending on your medical conditions, your age, and other factors. HOME CARE INSTRUCTIONS    Have your blood pressure rechecked as directed by your health care provider.   Take medicines only as directed by your health care provider. Follow the directions carefully. Blood pressure medicines must be taken as prescribed. The medicine does not work as well when you skip doses. Skipping doses also puts you at risk for  problems.  Do not smoke.   Monitor your blood pressure at home as directed by your health care provider. SEEK MEDICAL CARE IF:   You think you are having a reaction to medicines taken.  You have recurrent headaches or feel dizzy.  You have swelling in your ankles.  You have trouble with your vision. SEEK IMMEDIATE MEDICAL CARE IF:  You develop a severe headache or confusion.  You have unusual weakness, numbness, or feel faint.  You have severe chest or abdominal pain.  You vomit repeatedly.  You have trouble breathing. MAKE SURE YOU:   Understand these instructions.  Will watch your condition.  Will get help right away if you are not doing well or get worse.   This information is not intended to replace advice given to you by your health care provider. Make sure you discuss any questions you have with your health care provider.   Document Released: 04/19/2005 Document Revised: 09/03/2014 Document Reviewed: 02/09/2013 Elsevier Interactive Patient Education 2016 [employer] Inc[employer]  

## 2015-12-11 NOTE — Progress Notes (Signed)
   Subjective:    Patient ID: Rita Barry, female    DOB: [birth year], 50 y.o.   MRN: [id number]  PT presents to the office today to recheck HTN. PT's BP is at goal! PT reports losing 2 lbs and being on the DASH diet since our last visit.  Hypertension  This is a new problem. The current episode started more than 1 month ago. The problem has been resolved since onset. The problem is controlled. Pertinent negatives include no anxiety, blurred vision, headaches, palpitations, peripheral edema or shortness of breath. Risk factors for coronary artery disease include dyslipidemia, obesity and post-menopausal state. Past treatments include diuretics. The current treatment provides significant improvement. There is no history of kidney disease, CAD/MI, CVA or heart failure.      Review of Systems  Eyes: Negative for blurred vision.  Respiratory: Negative for shortness of breath.   Cardiovascular: Negative for palpitations.  Neurological: Negative for headaches.  All other systems reviewed and are negative.      Objective:   Physical Exam  Constitutional: She is oriented to person, place, and time. She appears well-developed and well-nourished. No distress.  HENT:  Head: Normocephalic.  Eyes: Pupils are equal, round, and reactive to light.  Neck: Normal range of motion. Neck supple. No thyromegaly present.  Cardiovascular: Normal rate, regular rhythm, normal heart sounds and intact distal pulses.   No murmur heard. Pulmonary/Chest: Effort normal and breath sounds normal. No respiratory distress. She has no wheezes.  Abdominal: Soft. Bowel sounds are normal. She exhibits no distension. There is no tenderness.  Musculoskeletal: Normal range of motion. She exhibits no edema or tenderness.  Neurological: She is alert and oriented to person, place, and time.  Skin: Skin is warm and dry.  Psychiatric: She has a normal mood and affect. Her behavior is normal. Judgment and thought content  normal.  Vitals reviewed.     BP 117/74   Pulse 78   Temp 97.7 F (36.5 C) (Oral)   Ht '5\' 7"'$  (1.702 m)   Wt 178 lb 8 oz (81 kg)   LMP 06/13/2015   BMI 27.96 kg/m      Assessment & Plan:  1. Essential hypertension -Dash diet information given -Exercise encouraged - Stress Management  -Continue current meds -RTO in 3 months - BMP8+EGFR   Continue all meds Labs pending Health Maintenance reviewed-TDAP given today Diet and exercise encouraged RTO 3 months  [name], FNP

## 2015-12-11 NOTE — Addendum Note (Signed)
Addended by: Margurite AuerbachOMPTON, Skyler Carel G on: 12/11/2015 04:20 PM   Modules accepted: Orders

## 2015-12-12 LAB — BMP8+EGFR
BUN/Creatinine Ratio: 23 (ref 9–23)
BUN: 19 mg/dL (ref 6–24)
CO2: 26 mmol/L (ref 18–29)
Calcium: 9.4 mg/dL (ref 8.7–10.2)
Chloride: 97 mmol/L (ref 96–106)
Creatinine, Ser: 0.82 mg/dL (ref 0.57–1.00)
GFR calc Af Amer: 96 mL/min/{1.73_m2} (ref >59)
GFR calc non Af Amer: 84 mL/min/{1.73_m2} (ref >59)
Glucose: 81 mg/dL (ref 65–99)
Potassium: 4.6 mmol/L (ref 3.5–5.2)
Sodium: 138 mmol/L (ref 134–144)

## 2015-12-31 ENCOUNTER — Encounter: Payer: Self-pay | Admitting: Pediatrics

## 2015-12-31 ENCOUNTER — Ambulatory Visit (INDEPENDENT_AMBULATORY_CARE_PROVIDER_SITE_OTHER): Payer: Federal, State, Local not specified - PPO | Admitting: Pediatrics

## 2015-12-31 VITALS — BP 120/76 | HR 77 | Temp 98.0°F | Ht 67.0 in | Wt 181.2 lb

## 2015-12-31 DIAGNOSIS — I10 Essential (primary) hypertension: Secondary | ICD-10-CM | POA: Diagnosis not present

## 2015-12-31 DIAGNOSIS — T63451A Toxic effect of venom of hornets, accidental (unintentional), initial encounter: Secondary | ICD-10-CM

## 2015-12-31 MED ORDER — PREDNISONE 10 MG PO TABS: ORAL_TABLET | ORAL | 0 refills | Status: DC

## 2015-12-31 MED ORDER — CLOBETASOL PROPIONATE 0.05 % EX CREA: 1.0000 "application " | TOPICAL_CREAM | Freq: Two times a day (BID) | CUTANEOUS | 0 refills | Status: DC

## 2015-12-31 NOTE — Progress Notes (Signed)
  Subjective:   Patient ID: Rita Barry, female    DOB: [birth year], 50 y.o.   MRN: [id number] CC: Insect Bite (right hand)  HPI: Rita PatellaHeather P Sinkfield is a 50 y.o. female presenting for Insect Bite (right hand)  Stung by ground hornets two days ago Using topical OTC steroid, helps with itching minimally Ice mkes it feel better Still red and swollen, decreased grip strength No throat itching or swelling  No HA, no CP  Relevant past medical, surgical, family and social history reviewed. Allergies and medications reviewed and updated. History  Smoking Status  . Never Smoker  Smokeless Tobacco  . Never Used   ROS: Per HPI   Objective:    BP 120/76   Pulse 77   Temp 98 F (36.7 C)   Ht 5\' 7"  (1.702 m)   Wt 181 lb 3.2 oz (82.2 kg)   LMP 06/13/2015   BMI 28.38 kg/m   Wt Readings from Last 3 Encounters:  12/31/15 181 lb 3.2 oz (82.2 kg)  12/11/15 178 lb 8 oz (81 kg)  11/26/15 180 lb 3.2 oz (81.7 kg)    Gen: NAD, alert, cooperative with exam, NCAT EYES: EOMI, no conjunctival injection, or no icterus CV: NRRR, normal S1/S2, no murmur, distal pulses 2+ b/l Resp: CTABL, no wheezes, normal WOB Neuro: Alert and oriented, strength equal b/l UE and LE, coordination grossly normal MSK: decreased movement with hand grip R hand  Skin: R hand 2nd finger MCP with slight redness, swelling that extends throughout hand. Warm compared to L hand. No discharge  Assessment & Plan:  Rita Barry was seen today for insect bite.  Diagnoses and all orders for this visit:  Hornet sting, accidental or unintentional, initial encounter Local reaction NSAIDs, ice Benadryl at night, claritin in day Steroid taper and topical steroid with amount of itching Does not appear infected now, will let me know if any worsening -     predniSONE (DELTASONE) 10 MG tablet; Take 40mg  x1day, 30x1 day, 20mg x1 day, 10x1 day -     clobetasol cream (TEMOVATE) 0.05 %; Apply 1 application topically 2 (two) times  daily.  HTN Improved with recheck, well controlled cont current meds  Follow up plan: As needed Rex Krasarol Reannon Candella, [facility]

## 2015-12-31 NOTE — Patient Instructions (Signed)
Take prednisone taper (4, 3, 2, 1 pills over 4 days) Ibuprofen 600mg  every 6 hours as needed for pain/swelling Ice as needed Benadryl at night Claritin in the day Clobetasol topical steroid to help with itching

## 2016-01-01 ENCOUNTER — Other Ambulatory Visit: Payer: Self-pay | Admitting: *Deleted

## 2016-01-01 MED ORDER — FLUTICASONE PROPIONATE 50 MCG/ACT NA SUSP: NASAL | 11 refills | Status: DC

## 2016-02-20 ENCOUNTER — Other Ambulatory Visit: Payer: Self-pay | Admitting: Family

## 2016-02-21 ENCOUNTER — Ambulatory Visit (INDEPENDENT_AMBULATORY_CARE_PROVIDER_SITE_OTHER): Payer: Federal, State, Local not specified - PPO | Admitting: Pediatrics

## 2016-02-21 VITALS — BP 125/69 | HR 80 | Temp 97.7°F | Ht 67.0 in | Wt 189.2 lb

## 2016-02-21 DIAGNOSIS — R590 Localized enlarged lymph nodes: Secondary | ICD-10-CM

## 2016-02-21 DIAGNOSIS — J029 Acute pharyngitis, unspecified: Secondary | ICD-10-CM | POA: Diagnosis not present

## 2016-02-21 DIAGNOSIS — J309 Allergic rhinitis, unspecified: Secondary | ICD-10-CM | POA: Diagnosis not present

## 2016-02-21 DIAGNOSIS — R1310 Dysphagia, unspecified: Secondary | ICD-10-CM | POA: Diagnosis not present

## 2016-02-21 DIAGNOSIS — R1319 Other dysphagia: Secondary | ICD-10-CM

## 2016-02-21 NOTE — Progress Notes (Signed)
  Subjective:   Patient ID: Rita Barry, female    DOB: [birth year], 50 y.o.   MRN: [id number] CC: Sore Throat (x 4 weeks on and off. Patient states her dad had throat cancer. )  Mornings and nights worst Sore throat comes and goes Occasionally mid day Not there all day Has some sinus congestion off and on, doesn't relate with sore symptoms Takes flonase OTC antihistamine daily Doesn't feel like something is stuck  When she swallows certain foods such as chicken, eggs the food gets stuck midway down esophagus and she has to regurgitate it up because it wont go down Has tried drinking fluids to help when this happens but that makes it worse and the water has to come up as well Drinking without problem This has bene ongoing for some years Had a barium swallow study down 17 yrs ago in [place] she says that she was told was normal She thinks symptoms more pronounced now  Appetite has been fine, no weight loss  Dad smoked for about 30 yrs, had throat cancer  Relevant past medical, surgical, family and social history reviewed. Allergies and medications reviewed and updated. History  Smoking Status  . Never Smoker  Smokeless Tobacco  . Never Used   ROS: Per HPI   Objective:    BP 125/69   Pulse 80   Temp 97.7 F (36.5 C) (Oral)   Ht 5\' 7"  (1.702 m)   Wt 189 lb 3.2 oz (85.8 kg)   BMI 29.63 kg/m   Wt Readings from Last 3 Encounters:  02/21/16 189 lb 3.2 oz (85.8 kg)  12/31/15 181 lb 3.2 oz (82.2 kg)  12/11/15 178 lb 8 oz (81 kg)    Gen: NAD, alert, cooperative with exam, NCAT EYES: EOMI, no conjunctival injection, or no icterus ENT:  TMs pearly gray b/l, OP without erythema, normal appearing tonsils LYMPH: L posterior cervical 1 cm LN, non-tender, no other cervical LAD CV: NRRR, normal S1/S2, no murmur, distal pulses 2+ b/l Resp: CTABL, no wheezes, normal WOB Abd: +BS, soft, NTND. no guarding or organomegaly Ext: No edema, warm Neuro: Alert and oriented, strength equal b/l  UE and LE, coordination grossly normal MSK: normal muscle bulk  Assessment & Plan:  [name] was seen today for sore throat, other complaints  Diagnoses and all orders for this visit:  Esophageal dysphagia Food regulalry getting stuck, mostly thicker foods, feels mostly in her esophagus -     Ambulatory referral to Gastroenterology  Sore throat Likely due to allergic rhinitis Cont antihistamines, flonase, netipot -     Culture, Group A Strep  Lymphadenopathy, cervical One posterior L side LN Non-tender Will cont to follow  Allergic rhinitis, unspecified chronicity, unspecified seasonality, unspecified trigger OTC meds as above  Follow up plan: Return as scheduled. Rex Krasarol Rayan Dyal, MD [facility]

## 2016-02-24 LAB — CULTURE, GROUP A STREP: Strep A Culture: NEGATIVE

## 2016-02-27 ENCOUNTER — Telehealth: Payer: Self-pay | Admitting: Family

## 2016-02-27 NOTE — Telephone Encounter (Signed)
Aware of negative throat culture.

## 2016-03-01 NOTE — Addendum Note (Signed)
Addended by: Johna SheriffVINCENT, CAROL L on: 03/01/2016 07:45 AM   Modules accepted: Orders

## 2016-03-08 ENCOUNTER — Encounter: Payer: Self-pay | Admitting: Gastroenterology

## 2016-03-15 ENCOUNTER — Encounter: Payer: Self-pay | Admitting: Family

## 2016-03-15 ENCOUNTER — Ambulatory Visit (INDEPENDENT_AMBULATORY_CARE_PROVIDER_SITE_OTHER): Payer: Federal, State, Local not specified - PPO | Admitting: Family

## 2016-03-15 VITALS — BP 111/72 | HR 70 | Temp 98.1°F | Ht 67.0 in | Wt 185.6 lb

## 2016-03-15 DIAGNOSIS — F411 Generalized anxiety disorder: Secondary | ICD-10-CM | POA: Diagnosis not present

## 2016-03-15 DIAGNOSIS — E559 Vitamin D deficiency, unspecified: Secondary | ICD-10-CM

## 2016-03-15 DIAGNOSIS — Z0289 Encounter for other administrative examinations: Secondary | ICD-10-CM

## 2016-03-15 DIAGNOSIS — I10 Essential (primary) hypertension: Secondary | ICD-10-CM | POA: Diagnosis not present

## 2016-03-15 DIAGNOSIS — F331 Major depressive disorder, recurrent, moderate: Secondary | ICD-10-CM

## 2016-03-15 DIAGNOSIS — H8110 Benign paroxysmal vertigo, unspecified ear: Secondary | ICD-10-CM | POA: Diagnosis not present

## 2016-03-15 DIAGNOSIS — M797 Fibromyalgia: Secondary | ICD-10-CM | POA: Diagnosis not present

## 2016-03-15 DIAGNOSIS — J309 Allergic rhinitis, unspecified: Secondary | ICD-10-CM | POA: Diagnosis not present

## 2016-03-15 DIAGNOSIS — E785 Hyperlipidemia, unspecified: Secondary | ICD-10-CM

## 2016-03-15 DIAGNOSIS — F1120 Opioid dependence, uncomplicated: Secondary | ICD-10-CM | POA: Diagnosis not present

## 2016-03-15 MED ORDER — TRAMADOL HCL 50 MG PO TABS: ORAL_TABLET | ORAL | 2 refills | Status: DC

## 2016-03-15 NOTE — Patient Instructions (Signed)
Health Maintenance, Female Adopting a healthy lifestyle and getting preventive care can go a long way to promote health and wellness. Talk with your health care provider about what schedule of regular examinations is right for you. This is a good chance for you to check in with your provider about disease prevention and staying healthy. In between checkups, there are plenty of things you can do on your own. Experts have done a lot of research about which lifestyle changes and preventive measures are most likely to keep you healthy. Ask your health care provider for more information. WEIGHT AND DIET  Eat a healthy diet  Be sure to include plenty of vegetables, fruits, low-fat dairy products, and lean protein.  Do not eat a lot of foods high in solid fats, added sugars, or salt.  Get regular exercise. This is one of the most important things you can do for your health.  Most adults should exercise for at least 150 minutes each week. The exercise should increase your heart rate and make you sweat (moderate-intensity exercise).  Most adults should also do strengthening exercises at least twice a week. This is in addition to the moderate-intensity exercise.  Maintain a healthy weight  Body mass index (BMI) is a measurement that can be used to identify possible weight problems. It estimates body fat based on height and weight. Your health care provider can help determine your BMI and help you achieve or maintain a healthy weight.  For females 20 years of age and older:   A BMI below 18.5 is considered underweight.  A BMI of 18.5 to 24.9 is normal.  A BMI of 25 to 29.9 is considered overweight.  A BMI of 30 and above is considered obese.  Watch levels of cholesterol and blood lipids  You should start having your blood tested for lipids and cholesterol at 51 years of age, then have this test every 5 years.  You may need to have your cholesterol levels checked more often if:  Your lipid  or cholesterol levels are high.  You are older than 50 years of age.  You are at high risk for heart disease.  CANCER SCREENING   Lung Cancer  Lung cancer screening is recommended for adults 55-80 years old who are at high risk for lung cancer because of a history of smoking.  A yearly low-dose CT scan of the lungs is recommended for people who:  Currently smoke.  Have quit within the past 15 years.  Have at least a 30-pack-year history of smoking. A pack year is smoking an average of one pack of cigarettes a day for 1 year.  Yearly screening should continue until it has been 15 years since you quit.  Yearly screening should stop if you develop a health problem that would prevent you from having lung cancer treatment.  Breast Cancer  Practice breast self-awareness. This means understanding how your breasts normally appear and feel.  It also means doing regular breast self-exams. Let your health care provider know about any changes, no matter how small.  If you are in your 20s or 30s, you should have a clinical breast exam (CBE) by a health care provider every 1-3 years as part of a regular health exam.  If you are 40 or older, have a CBE every year. Also consider having a breast X-ray (mammogram) every year.  If you have a family history of breast cancer, talk to your health care provider about genetic screening.  If you   are at high risk for breast cancer, talk to your health care provider about having an MRI and a mammogram every year.  Breast cancer gene (BRCA) assessment is recommended for women who have family members with BRCA-related cancers. BRCA-related cancers include:  Breast.  Ovarian.  Tubal.  Peritoneal cancers.  Results of the assessment will determine the need for genetic counseling and BRCA1 and BRCA2 testing. Cervical Cancer Your health care provider may recommend that you be screened regularly for cancer of the pelvic organs (ovaries, uterus, and  vagina). This screening involves a pelvic examination, including checking for microscopic changes to the surface of your cervix (Pap test). You may be encouraged to have this screening done every 3 years, beginning at age 21.  For women ages 30-65, health care providers may recommend pelvic exams and Pap testing every 3 years, or they may recommend the Pap and pelvic exam, combined with testing for human papilloma virus (HPV), every 5 years. Some types of HPV increase your risk of cervical cancer. Testing for HPV may also be done on women of any age with unclear Pap test results.  Other health care providers may not recommend any screening for nonpregnant women who are considered low risk for pelvic cancer and who do not have symptoms. Ask your health care provider if a screening pelvic exam is right for you.  If you have had past treatment for cervical cancer or a condition that could lead to cancer, you need Pap tests and screening for cancer for at least 20 years after your treatment. If Pap tests have been discontinued, your risk factors (such as having a new sexual partner) need to be reassessed to determine if screening should resume. Some women have medical problems that increase the chance of getting cervical cancer. In these cases, your health care provider may recommend more frequent screening and Pap tests. Colorectal Cancer  This type of cancer can be detected and often prevented.  Routine colorectal cancer screening usually begins at 50 years of age and continues through 51 years of age.  Your health care provider may recommend screening at an earlier age if you have risk factors for colon cancer.  Your health care provider may also recommend using home test kits to check for hidden blood in the stool.  A small camera at the end of a tube can be used to examine your colon directly (sigmoidoscopy or colonoscopy). This is done to check for the earliest forms of colorectal  cancer.  Routine screening usually begins at age 50.  Direct examination of the colon should be repeated every 5-10 years through 51 years of age. However, you may need to be screened more often if early forms of precancerous polyps or small growths are found. Skin Cancer  Check your skin from head to toe regularly.  Tell your health care provider about any new moles or changes in moles, especially if there is a change in a mole's shape or color.  Also tell your health care provider if you have a mole that is larger than the size of a pencil eraser.  Always use sunscreen. Apply sunscreen liberally and repeatedly throughout the day.  Protect yourself by wearing long sleeves, pants, a wide-brimmed hat, and sunglasses whenever you are outside. HEART DISEASE, DIABETES, AND HIGH BLOOD PRESSURE   High blood pressure causes heart disease and increases the risk of stroke. High blood pressure is more likely to develop in:  People who have blood pressure in the high end   of the normal range (130-139/85-89 mm Hg).  People who are overweight or obese.  People who are African American.  If you are 38-23 years of age, have your blood pressure checked every 3-5 years. If you are 61 years of age or older, have your blood pressure checked every year. You should have your blood pressure measured twice--once when you are at a hospital or clinic, and once when you are not at a hospital or clinic. Record the average of the two measurements. To check your blood pressure when you are not at a hospital or clinic, you can use:  An automated blood pressure machine at a pharmacy.  A home blood pressure monitor.  If you are between 45 years and 39 years old, ask your health care provider if you should take aspirin to prevent strokes.  Have regular diabetes screenings. This involves taking a blood sample to check your fasting blood sugar level.  If you are at a normal weight and have a low risk for diabetes,  have this test once every three years after 51 years of age.  If you are overweight and have a high risk for diabetes, consider being tested at a younger age or more often. PREVENTING INFECTION  Hepatitis B  If you have a higher risk for hepatitis B, you should be screened for this virus. You are considered at high risk for hepatitis B if:  You were born in a country where hepatitis B is common. Ask your health care provider which countries are considered high risk.  Your parents were born in a high-risk country, and you have not been immunized against hepatitis B (hepatitis B vaccine).  You have HIV or AIDS.  You use needles to inject street drugs.  You live with someone who has hepatitis B.  You have had sex with someone who has hepatitis B.  You get hemodialysis treatment.  You take certain medicines for conditions, including cancer, organ transplantation, and autoimmune conditions. Hepatitis C  Blood testing is recommended for:  Everyone born from [birth year] through 1966.  Anyone with known risk factors for hepatitis C. Sexually transmitted infections (STIs)  You should be screened for sexually transmitted infections (STIs) including gonorrhea and chlamydia if:  You are sexually active and are younger than 51 years of age.  You are older than 51 years of age and your health care provider tells you that you are at risk for this type of infection.  Your sexual activity has changed since you were last screened and you are at an increased risk for chlamydia or gonorrhea. Ask your health care provider if you are at risk.  If you do not have HIV, but are at risk, it may be recommended that you take a prescription medicine daily to prevent HIV infection. This is called pre-exposure prophylaxis (PrEP). You are considered at risk if:  You are sexually active and do not regularly use condoms or know the HIV status of your partner(s).  You take drugs by injection.  You are sexually  active with a partner who has HIV. Talk with your health care provider about whether you are at high risk of being infected with HIV. If you choose to begin PrEP, you should first be tested for HIV. You should then be tested every 3 months for as long as you are taking PrEP.  PREGNANCY   If you are premenopausal and you may become pregnant, ask your health care provider about preconception counseling.  If you may  become pregnant, take 400 to 800 micrograms (mcg) of folic acid every day.  If you want to prevent pregnancy, talk to your health care provider about birth control (contraception). OSTEOPOROSIS AND MENOPAUSE   Osteoporosis is a disease in which the bones lose minerals and strength with aging. This can result in serious bone fractures. Your risk for osteoporosis can be identified using a bone density scan.  If you are 61 years of age or older, or if you are at risk for osteoporosis and fractures, ask your health care provider if you should be screened.  Ask your health care provider whether you should take a calcium or vitamin D supplement to lower your risk for osteoporosis.  Menopause may have certain physical symptoms and risks.  Hormone replacement therapy may reduce some of these symptoms and risks. Talk to your health care provider about whether hormone replacement therapy is right for you.  HOME CARE INSTRUCTIONS   Schedule regular health, dental, and eye exams.  Stay current with your immunizations.   Do not use any tobacco products including cigarettes, chewing tobacco, or electronic cigarettes.  If you are pregnant, do not drink alcohol.  If you are breastfeeding, limit how much and how often you drink alcohol.  Limit alcohol intake to no more than 1 drink per day for nonpregnant women. One drink equals 12 ounces of beer, 5 ounces of wine, or 1 ounces of hard liquor.  Do not use street drugs.  Do not share needles.  Ask your health care provider for help if  you need support or information about quitting drugs.  Tell your health care provider if you often feel depressed.  Tell your health care provider if you have ever been abused or do not feel safe at home.   This information is not intended to replace advice given to you by your health care provider. Make sure you discuss any questions you have with your health care provider.   Document Released: 11/02/2010 Document Revised: 05/10/2014 Document Reviewed: 03/21/2013 Elsevier Interactive Patient Education [employer].

## 2016-03-15 NOTE — Progress Notes (Signed)
Subjective:    Patient ID: Rita Barry, female    DOB: [birth year], 50 y.o.   MRN: [id number]   PT presents to the office today for chronic follow up. PT takes Ultram  BID for Fibromyalgia and states this seems to help a great deal.   Hypertension  This is a new problem. The current episode started more than 1 month ago. The problem has been resolved since onset. The problem is controlled. Associated symptoms include anxiety. Pertinent negatives include no palpitations, peripheral edema or shortness of breath. Risk factors for coronary artery disease include dyslipidemia, sedentary lifestyle and obesity. Past treatments include nothing. The current treatment provides no improvement. Compliance problems include diet.  There is no history of CAD/MI, CVA or heart failure.  Hyperlipidemia  This is a chronic problem. The current episode started more than 1 year ago. The problem is uncontrolled. Recent lipid tests were reviewed and are high. Exacerbating diseases include obesity. Pertinent negatives include no shortness of breath. Current antihyperlipidemic treatment includes diet change. The current treatment provides mild improvement of lipids. Risk factors for coronary artery disease include dyslipidemia, obesity and a sedentary lifestyle.  Knee Pain   The incident occurred more than 1 week ago. Injury mechanism: Pt knelt down and heard a "popping noise" The pain is present in the left knee. The quality of the pain is described as aching. The pain is at a severity of 6/10. The pain is mild. The pain has been fluctuating since onset. Associated symptoms include a loss of motion. Pertinent negatives include no muscle weakness or tingling. She reports no foreign bodies present. The symptoms are aggravated by movement. She has tried acetaminophen, rest, non-weight bearing and ice for the symptoms. The treatment provided mild relief.  Depression       The patient presents with depression.  This is a  chronic problem.  The current episode started more than 1 year ago.   The onset quality is gradual.   The problem occurs rarely.  The problem has been waxing and waning since onset.  Associated symptoms include no hopelessness, does not have insomnia, not irritable, no body aches, not sad and no suicidal ideas.  Past treatments include SSRIs - Selective serotonin reuptake inhibitors.  Compliance with treatment is good.  Previous treatment provided significant relief.  Past medical history includes anxiety and depression.   Anxiety  Presents for follow-up visit. Onset was more than 5 years ago. The problem has been waxing and waning. Patient reports no dizziness, excessive worry, insomnia, nervous/anxious behavior, palpitations, shortness of breath or suicidal ideas. Symptoms occur rarely. The quality of sleep is good.   Her past medical history is significant for anxiety/panic attacks and depression. Past treatments include SSRIs. The treatment provided significant relief. Compliance with prior treatments has been good.  Dizziness  This is a chronic problem. The current episode started more than 1 year ago. The problem occurs intermittently. The problem has been waxing and waning. The symptoms are aggravated by bending. She has tried lying down, position changes, relaxation and rest for the symptoms. The treatment provided significant relief.    Pain assessment: Cause of pain- Fibromyalgia and knee pain Pain location- Left knee Pain on scale of 1-10- 6 Frequency- intermittent What increases pain-Walking up steps What makes pain Better-Rest and pain medication Effects on ADL -  Any change in general medical condition-N/A  Current medications- Ultram 50 mg BID Effectiveness of current meds-Doing well Adverse reactions form pain meds-None Morphine equivalent  Pill count performed-No Urine drug screen- No, 08/01/15 Was the [facility] reviewed- 08/01/15  If yes were their any concerning findings? -  None, only received medication from me   Review of Systems  Constitutional: Negative.   HENT: Negative.   Eyes: Negative.   Respiratory: Negative.  Negative for shortness of breath.   Cardiovascular: Negative.  Negative for palpitations.  Gastrointestinal: Negative.   Endocrine: Negative.   Genitourinary: Negative.   Musculoskeletal: Negative.   Neurological: Negative.  Negative for dizziness and tingling.  Hematological: Negative.   Psychiatric/Behavioral: Positive for depression. Negative for suicidal ideas. The patient is not nervous/anxious and does not have insomnia.   All other systems reviewed and are negative.      Objective:   Physical Exam  Constitutional: She is oriented to person, place, and time. She appears well-developed and well-nourished. She is not irritable. No distress.  HENT:  Head: Normocephalic and atraumatic.  Right Ear: External ear normal.  Left Ear: External ear normal.  Nose: Nose normal.  Mouth/Throat: Oropharynx is clear and moist.  Eyes: Pupils are equal, round, and reactive to light.  Neck: Normal range of motion. Neck supple. No thyromegaly present.  Cardiovascular: Normal rate, regular rhythm, normal heart sounds and intact distal pulses.   No murmur heard. Pulmonary/Chest: Effort normal and breath sounds normal. No respiratory distress. She has no wheezes.  Abdominal: Soft. Bowel sounds are normal. She exhibits no distension. There is no tenderness.  Genitourinary:  Genitourinary Comments:    Musculoskeletal: Normal range of motion. She exhibits no edema or tenderness.  Neurological: She is alert and oriented to person, place, and time. She has normal reflexes. No cranial nerve deficit.  Skin: Skin is warm and dry.  Psychiatric: She has a normal mood and affect. Her behavior is normal. Judgment and thought content normal.  Vitals reviewed.    BP 111/72   Pulse 70   Temp 98.1 F (36.7 C) (Oral)   Ht 5' 7"  (1.702 m)   Wt 185 lb 9.6  oz (84.2 kg)   BMI 29.07 kg/m      Assessment & Plan:  1. Essential hypertension - CMP14+EGFR  2. Allergic rhinitis, unspecified chronicity, unspecified seasonality, unspecified trigger - CMP14+EGFR  3. Moderate episode of recurrent major depressive disorder (HCC) - CMP14+EGFR  4. Fibromyalgia - CMP14+EGFR - traMADol (ULTRAM) 50 MG tablet; TAKE 1 TABLET BY MOUTH EVERY 6 HOURS AS NEEDED  Dispense: 60 tablet; Refill: 2  5. GAD (generalized anxiety disorder) - CMP14+EGFR  6. Hyperlipidemia, unspecified hyperlipidemia type - CMP14+EGFR - Lipid panel  7. Vitamin D deficiency - CMP14+EGFR - VITAMIN D 25 Hydroxy (Vit-D Deficiency, Fractures)  8. Pain medication agreement signed - CMP14+EGFR - traMADol (ULTRAM) 50 MG tablet; TAKE 1 TABLET BY MOUTH EVERY 6 HOURS AS NEEDED  Dispense: 60 tablet; Refill: 2  9. Uncomplicated opioid dependence (HCC) - CMP14+EGFR - traMADol (ULTRAM) 50 MG tablet; TAKE 1 TABLET BY MOUTH EVERY 6 HOURS AS NEEDED  Dispense: 60 tablet; Refill: 2  10. Benign paroxysmal positional vertigo, unspecified laterality - CMP14+EGFR   Continue all meds Labs pending Health Maintenance reviewed Diet and exercise encouraged RTO 3 months  [name], FNP

## 2016-03-16 ENCOUNTER — Other Ambulatory Visit: Payer: Self-pay | Admitting: Family

## 2016-03-16 LAB — CMP14+EGFR
ALT: 24 IU/L (ref 0–32)
AST: 28 IU/L (ref 0–40)
Albumin/Globulin Ratio: 1.7 (ref 1.2–2.2)
Albumin: 4.5 g/dL (ref 3.5–5.5)
Alkaline Phosphatase: 27 IU/L — ABNORMAL LOW (ref 39–117)
BUN/Creatinine Ratio: 23 (ref 9–23)
BUN: 19 mg/dL (ref 6–24)
Bilirubin Total: 0.5 mg/dL (ref 0.0–1.2)
CO2: 26 mmol/L (ref 18–29)
Calcium: 9.3 mg/dL (ref 8.7–10.2)
Chloride: 97 mmol/L (ref 96–106)
Creatinine, Ser: 0.82 mg/dL (ref 0.57–1.00)
GFR calc Af Amer: 96 mL/min/{1.73_m2} (ref >59)
GFR calc non Af Amer: 83 mL/min/{1.73_m2} (ref >59)
Globulin, Total: 2.6 g/dL (ref 1.5–4.5)
Glucose: 95 mg/dL (ref 65–99)
Potassium: 4.2 mmol/L (ref 3.5–5.2)
Sodium: 139 mmol/L (ref 134–144)
Total Protein: 7.1 g/dL (ref 6.0–8.5)

## 2016-03-16 LAB — LIPID PANEL
Chol/HDL Ratio: 3.1 ratio units (ref 0.0–4.4)
Cholesterol, Total: 206 mg/dL — ABNORMAL HIGH (ref 100–199)
HDL: 66 mg/dL (ref >39)
LDL Calculated: 129 mg/dL — ABNORMAL HIGH (ref 0–99)
Triglycerides: 56 mg/dL (ref 0–149)
VLDL Cholesterol Cal: 11 mg/dL (ref 5–40)

## 2016-03-16 LAB — VITAMIN D 25 HYDROXY (VIT D DEFICIENCY, FRACTURES): Vit D, 25-Hydroxy: 38.4 ng/mL (ref 30.0–100.0)

## 2016-03-16 MED ORDER — SIMVASTATIN 20 MG PO TABS: 20.0000 mg | ORAL_TABLET | Freq: Every day | ORAL | 1 refills | Status: DC

## 2016-04-06 DIAGNOSIS — K080 Exfoliation of teeth due to systemic causes: Secondary | ICD-10-CM | POA: Diagnosis not present

## 2016-04-08 ENCOUNTER — Ambulatory Visit: Payer: Federal, State, Local not specified - PPO | Admitting: Gastroenterology

## 2016-05-05 ENCOUNTER — Ambulatory Visit (INDEPENDENT_AMBULATORY_CARE_PROVIDER_SITE_OTHER): Payer: Federal, State, Local not specified - PPO | Admitting: Gastroenterology

## 2016-05-05 ENCOUNTER — Other Ambulatory Visit: Payer: Self-pay

## 2016-05-05 ENCOUNTER — Encounter: Payer: Self-pay | Admitting: Gastroenterology

## 2016-05-05 DIAGNOSIS — R1319 Other dysphagia: Secondary | ICD-10-CM

## 2016-05-05 DIAGNOSIS — R1310 Dysphagia, unspecified: Secondary | ICD-10-CM | POA: Diagnosis not present

## 2016-05-05 NOTE — Patient Instructions (Addendum)
YOUR PROBLEM SWALLOWING MAY BE DUE TO THE FOLLOWING:  SCHATZKI'S RING, ESOPHAGEAL STRICTURE, EOSINOPHILIC ESOPHAGITIS, OR AN ESOPHAGEAL MOTILITY DISORDER(NON-SPECIFIC, ESOPHAGEAL SPASM, OR ACHALASIA).  COMPLETE UPPER ENDOSCOPY WITH DILATIONS AND ESOPHAGEAL BIOPSY. PROCEDURE RISKS ARE : < 1% chance of medication reaction, PERFORATION, OR bleeding.  FOLLOW UP IN 4 MOS.

## 2016-05-05 NOTE — Assessment & Plan Note (Addendum)
ASSOCIATED WITH NEED TO INDUCE VOMITING. PROBLEM SWALLOWING MAY BE DUE TO THE FOLLOWING: SCHATZKI'S RING, ESOPHAGEAL STRICTURE, EOE, OR AN ESOPHAGEAL MOTILITY DISORDER(NON-SPECIFIC, ESOPHAGEAL SPASM, OR ACHALASIA). COMPLETE UPPER ENDOSCOPY WITH DILATIONS AND ESOPHAGEAL BIOPSY. PROCEDURE RISKS ARE : < 1% chance of medication reaction, PERFORATION, OR bleeding. PHENERGAN 12.5 MG IV IN PREOP.  FOLLOW UP IN 4 MOS.   GREATER THAN 50% WAS SPENT IN COUNSELING & COORDINATION OF CARE WITH THE PATIENT: DISCUSSED DIFFERENTIAL DIAGNOSIS, PROCEDURE, BENEFITS, RISKS, AND MANAGEMENT OF DYSPHAGIA. TOTAL ENCOUNTER TIME: 25 MINS.

## 2016-05-05 NOTE — Progress Notes (Signed)
cc'ed to pcp °

## 2016-05-05 NOTE — Progress Notes (Signed)
Subjective:    Patient ID: Rita Barry, female    DOB: [birth year], 51 y.o.   MRN: [id number]  [name], FNP  HPI FOR PAST 20 YEARS SHE FEELS LIKE THERE'S A GOLF BALL IN HER ESOPHAGUS. HAD BARIUM STUDY BEFORE AND WAS TOLD SHE WAS FINE. FOOD STOPS AND SLOWLY GOES AROUND THE OBSTRUCTION. NEVER HAD AN EGD. USED TO BE CERTAIN FOODS: EGGS, CHICKEN, RICE, BREAD MAINLY. ETOH MAKES IT EVEN WORSE THE NEXT DAY.HAS SENSATION A COUPLE TIMES A MONTH AND MAY LAST 30 MINS TO COUPLE HOURS ESPECIALLY AFTER EGGS. FLARES ARE NOT PREDICATBALE. WEIGHT LOSS: USU 165 LBS IS NORMAL WEIGHT. NO MANOMETRY. MAY SEE BLACK STOOL OCCASIONALLY. ABOUT 4 DAYS AGO SAW SOME. APPETITE: USES ADVIL SOMETIMES OR NAPROXEN. WAS A [employer]. RETIRED 14 YRS.   PT DENIES FEVER, CHILLS, HEMATOCHEZIA, nausea, vomiting, melena, diarrhea, CHEST PAIN, SHORTNESS OF BREATH,  CHANGE IN BOWEL IN HABITS, constipation, abdominal pain,  problems with sedation, OR heartburn or indigestion.  Past Medical History:  Diagnosis Date  . Anxiety   . Depression   . Fibromyalgia    Questionable    Past Surgical History:  Procedure Laterality Date  . COLONOSCOPY N/A 09/01/2015   Procedure: COLONOSCOPY;  Surgeon: West Bali, MD;  Location: [facility];  Service: Endoscopy;  Laterality: N/A;  9:45 AM  . SHOULDER SURGERY     Left  . SHOULDER SURGERY Left    x2 surgeries    Allergies  Allergen Reactions  . Penicillins         Current Outpatient Prescriptions  Medication Sig Dispense Refill  . albuterol (PROVENTIL HFA;VENTOLIN HFA) 108 (90 Base) MCG/ACT inhaler Inhale 2 puffs into the lungs every 6 (six) hours as needed for wheezing or shortness of breath.    . escitalopram (LEXAPRO) 20 MG tablet TAKE 1 TABLET (20 MG TOTAL) BY MOUTH DAILY.    . fexofenadine (ALLEGRA) 180 MG tablet Take 180 mg by mouth as needed. Reported on 08/25/2015    . fluticasone (FLONASE) 50 MCG/ACT nasal spray PLACE 2 SPRAYS INTO THE NOSE DAILY.    Marland Kitchen GARLIC  PO Take 1 tablet by mouth daily.    . hydrochlorothiazide (HYDRODIURIL) 12.5 MG tablet Take 1 tablet (12.5 mg total) by mouth daily.    . Ibuprofen-Diphenhydramine Cit (ADVIL PM PO) Take 1 tablet by mouth as needed. Reported on 08/25/2015    . Magnesium 250 MG TABS Take 1 tablet by mouth daily.    . Milk Thistle 250 MG CAPS Take 1 capsule by mouth daily.    . Multiple Vitamins-Minerals (WOMENS MULTI VITAMIN & MINERAL PO) Take 1 tablet by mouth daily.    . norethindrone (MICRONOR,CAMILA,ERRIN) 0.35 MG tablet Take 1 tablet (0.35 mg total) by mouth daily.    . Probiotic Product (PHILLIPS COLON HEALTH PO) Take 1 tablet by mouth daily.    . traMADol (ULTRAM) 50 MG tablet TAKE 1 TABLET BY MOUTH EVERY 6 HOURS AS NEEDED    . Turmeric 500 MG TABS Take 1 capsule by mouth daily.    . Vitamin D, Cholecalciferol, 1000 units CAPS Take 1 tablet by mouth daily.    .       Review of Systems PER HPI OTHERWISE ALL SYSTEMS ARE NEGATIVE.    Objective:   Physical Exam  Constitutional: She is oriented to person, place, and time. She appears well-developed and well-nourished. No distress.  HENT:  Head: Normocephalic and atraumatic.  Mouth/Throat: Oropharynx is clear and moist. No oropharyngeal  exudate.  Eyes: Pupils are equal, round, and reactive to light. No scleral icterus.  Neck: Normal range of motion. Neck supple.  Cardiovascular: Normal rate, regular rhythm and normal heart sounds.   Pulmonary/Chest: Effort normal and breath sounds normal. No respiratory distress.  Abdominal: Soft. Bowel sounds are normal. She exhibits no distension. There is tenderness. There is no rebound and no guarding.  MODERATE tenderness in the epigastrium   Musculoskeletal: She exhibits no edema.  Lymphadenopathy:    She has no cervical adenopathy.  Neurological: She is alert and oriented to person, place, and time.  NO FOCAL DEFICITS  Psychiatric:  SLIGHTLY ANXIOUS MOOD, NL AFFECT  Vitals reviewed.     Assessment & Plan:

## 2016-05-05 NOTE — Progress Notes (Signed)
ON RECALL  °

## 2016-05-11 ENCOUNTER — Other Ambulatory Visit: Payer: Self-pay

## 2016-05-11 ENCOUNTER — Encounter (HOSPITAL_COMMUNITY): Admission: RE | Payer: Self-pay | Source: Ambulatory Visit

## 2016-05-11 ENCOUNTER — Ambulatory Visit (HOSPITAL_COMMUNITY)
Admission: RE | Admit: 2016-05-11 | Payer: Federal, State, Local not specified - PPO | Source: Ambulatory Visit | Admitting: Gastroenterology

## 2016-05-11 ENCOUNTER — Telehealth: Payer: Self-pay

## 2016-05-11 DIAGNOSIS — R1310 Dysphagia, unspecified: Secondary | ICD-10-CM

## 2016-05-11 SURGERY — EGD (ESOPHAGOGASTRODUODENOSCOPY)
Anesthesia: Moderate Sedation

## 2016-05-11 NOTE — Telephone Encounter (Signed)
Pt called office and LMOVM. She cancelled EGD/ED with [facility] today due to her roads were slippery.

## 2016-05-11 NOTE — Telephone Encounter (Signed)
Called pt. Rescheduled EGD/ED with [facility] for 05/21/16 at 10:30am, arrive at 9:30am. Gave instructions on phone and mailed new instruction letter. Pt requests to speak with Dr. Darrick PennaFields about dilation before procedure is done. She is not sure that she wants a dilation done. Advised pt I would forward message to Dr. Darrick PennaFields.

## 2016-05-13 NOTE — Telephone Encounter (Addendum)
See tc

## 2016-05-14 NOTE — Telephone Encounter (Signed)
Called patient TO DISCUSS CONCERNS. Concerned about manipulation of esophagus if it didn't;t need to happen. NERVOUS ABOUT THE DILATION. WONDERED IF IT WAS NECESSARY. HAS HAD IT FOR QUITE A WHILE AND CONCERNED ABOUT THE FACT THAT IT'S AN ISOLATED AREA AND ALLERGIC REACTION AND THOUGHT SHE MAY NOT NEED IT. CREEPED OUT BY IDEA OF STRETCHING HER ESOPHAGUS. EXPLAINED INDICATION AND COMPLICATION RISK. ANSWERED QUESTIONS.   EGD/POSSIBLE ESOPHAGEAL DILATION WITH PROXIMAL AND DISTAL ESOPHAGEAL BIOPSY.

## 2016-05-21 ENCOUNTER — Encounter (HOSPITAL_COMMUNITY): Admission: RE | Payer: Self-pay | Source: Ambulatory Visit

## 2016-05-21 ENCOUNTER — Ambulatory Visit (HOSPITAL_COMMUNITY)
Admission: RE | Admit: 2016-05-21 | Payer: Federal, State, Local not specified - PPO | Source: Ambulatory Visit | Admitting: Gastroenterology

## 2016-05-21 SURGERY — EGD (ESOPHAGOGASTRODUODENOSCOPY)
Anesthesia: Moderate Sedation

## 2016-05-24 ENCOUNTER — Telehealth: Payer: Self-pay

## 2016-05-24 ENCOUNTER — Other Ambulatory Visit: Payer: Self-pay

## 2016-05-24 DIAGNOSIS — R1310 Dysphagia, unspecified: Secondary | ICD-10-CM

## 2016-05-24 DIAGNOSIS — R1319 Other dysphagia: Secondary | ICD-10-CM

## 2016-05-24 NOTE — Telephone Encounter (Signed)
Pt cancelled EGD/ED with [facility] that was scheduled for 05/21/16 due to inclement weather. Procedure rescheduled for 06/04/16 at 10:30am, arrive at 9:30am. Instructions given on phone and mailed.

## 2016-05-31 ENCOUNTER — Encounter: Payer: Self-pay | Admitting: Family

## 2016-05-31 DIAGNOSIS — N6459 Other signs and symptoms in breast: Secondary | ICD-10-CM | POA: Diagnosis not present

## 2016-05-31 DIAGNOSIS — R922 Inconclusive mammogram: Secondary | ICD-10-CM | POA: Diagnosis not present

## 2016-06-01 ENCOUNTER — Other Ambulatory Visit: Payer: Self-pay | Admitting: Family

## 2016-06-04 ENCOUNTER — Encounter (HOSPITAL_COMMUNITY): Payer: Self-pay | Admitting: *Deleted

## 2016-06-04 ENCOUNTER — Encounter (HOSPITAL_COMMUNITY)
Admission: RE | Disposition: A | Discharge status: Home / Self Care | Payer: Self-pay | Source: Ambulatory Visit | Attending: Gastroenterology

## 2016-06-04 ENCOUNTER — Ambulatory Visit (HOSPITAL_COMMUNITY)
Admission: RE | Admit: 2016-06-04 | Discharge: 2016-06-04 | Disposition: A | Discharge status: Home / Self Care | Payer: Federal, State, Local not specified - PPO | Source: Ambulatory Visit | Attending: Gastroenterology | Admitting: Gastroenterology

## 2016-06-04 DIAGNOSIS — K2960 Other gastritis without bleeding: Secondary | ICD-10-CM

## 2016-06-04 DIAGNOSIS — Z79899 Other long term (current) drug therapy: Secondary | ICD-10-CM | POA: Insufficient documentation

## 2016-06-04 DIAGNOSIS — K2950 Unspecified chronic gastritis without bleeding: Secondary | ICD-10-CM | POA: Insufficient documentation

## 2016-06-04 DIAGNOSIS — F329 Major depressive disorder, single episode, unspecified: Secondary | ICD-10-CM | POA: Insufficient documentation

## 2016-06-04 DIAGNOSIS — R1319 Other dysphagia: Secondary | ICD-10-CM

## 2016-06-04 DIAGNOSIS — K222 Esophageal obstruction: Secondary | ICD-10-CM | POA: Diagnosis not present

## 2016-06-04 DIAGNOSIS — R1310 Dysphagia, unspecified: Secondary | ICD-10-CM | POA: Insufficient documentation

## 2016-06-04 DIAGNOSIS — K228 Other specified diseases of esophagus: Secondary | ICD-10-CM | POA: Diagnosis not present

## 2016-06-04 DIAGNOSIS — K2980 Duodenitis without bleeding: Secondary | ICD-10-CM | POA: Diagnosis not present

## 2016-06-04 HISTORY — PX: ESOPHAGOGASTRODUODENOSCOPY: SHX5428

## 2016-06-04 HISTORY — PX: SAVORY DILATION: SHX5439

## 2016-06-04 SURGERY — EGD (ESOPHAGOGASTRODUODENOSCOPY)
Anesthesia: Moderate Sedation

## 2016-06-04 MED ORDER — PROMETHAZINE HCL 25 MG/ML IJ SOLN
INTRAMUSCULAR | Status: DC | PRN
  Administered 2016-06-04: 12.5 mg via INTRAVENOUS

## 2016-06-04 MED ORDER — MIDAZOLAM HCL 5 MG/5ML IJ SOLN
INTRAMUSCULAR | Status: AC
  Filled 2016-06-04: qty 10

## 2016-06-04 MED ORDER — MEPERIDINE HCL 100 MG/ML IJ SOLN
INTRAMUSCULAR | Status: AC
  Filled 2016-06-04: qty 2

## 2016-06-04 MED ORDER — LIDOCAINE VISCOUS 2 % MT SOLN
OROMUCOSAL | Status: DC | PRN
  Administered 2016-06-04: 4 mL via OROMUCOSAL

## 2016-06-04 MED ORDER — MIDAZOLAM HCL 5 MG/5ML IJ SOLN
INTRAMUSCULAR | Status: DC | PRN
  Administered 2016-06-04 (×3): 1 mg via INTRAVENOUS
  Administered 2016-06-04: 2 mg via INTRAVENOUS

## 2016-06-04 MED ORDER — OMEPRAZOLE 20 MG PO CPDR: DELAYED_RELEASE_CAPSULE | ORAL | 11 refills | Status: DC

## 2016-06-04 MED ORDER — SODIUM CHLORIDE 0.9 % IV SOLN
INTRAVENOUS | Status: DC
  Administered 2016-06-04: 1000 mL via INTRAVENOUS

## 2016-06-04 MED ORDER — MINERAL OIL PO OIL
TOPICAL_OIL | ORAL | Status: AC
  Filled 2016-06-04: qty 30

## 2016-06-04 MED ORDER — MEPERIDINE HCL 100 MG/ML IJ SOLN
INTRAMUSCULAR | Status: DC | PRN
  Administered 2016-06-04: 50 mg via INTRAVENOUS
  Administered 2016-06-04: 25 mg via INTRAVENOUS

## 2016-06-04 MED ORDER — PROMETHAZINE HCL 25 MG/ML IJ SOLN
12.5000 mg | Freq: Four times a day (QID) | INTRAMUSCULAR | Status: DC | PRN
  Administered 2016-06-04: 12.5 mg via INTRAVENOUS

## 2016-06-04 MED ORDER — PROMETHAZINE HCL 25 MG/ML IJ SOLN
INTRAMUSCULAR | Status: AC
  Filled 2016-06-04: qty 1

## 2016-06-04 MED ORDER — SODIUM CHLORIDE 0.9% FLUSH
INTRAVENOUS | Status: AC
  Filled 2016-06-04: qty 10

## 2016-06-04 MED ORDER — LIDOCAINE VISCOUS 2 % MT SOLN
OROMUCOSAL | Status: AC
  Filled 2016-06-04: qty 15

## 2016-06-04 NOTE — Interval H&P Note (Signed)
History and Physical Interval Note:  06/04/2016 10:47 AM  Rita Barry  has presented today for surgery, with the diagnosis of esophageal dysphagia  The various methods of treatment have been discussed with the patient and family. After consideration of risks, benefits and other options for treatment, the patient has consented to  Procedure(s) with comments: ESOPHAGOGASTRODUODENOSCOPY (EGD) (N/A) - 10:30 am SAVORY DILATION (N/A) as a surgical intervention .  The patient's history has been reviewed, patient examined, no change in status, stable for surgery.  I have reviewed the patient's chart and labs.  Questions were answered to the patient's satisfaction.     [employer]aton CorporationSandi Doak Mah

## 2016-06-04 NOTE — Discharge Instructions (Signed)
I dilated your esophagus. You have a stricture near the base of your esophagus.  You have EROSIVE gastritis DUE TO IBUPROFEN. I biopsied your ESOPHAGUS AND stomach.   START CONTINUE OMEPRAZOLE.  TAKE 30 MINUTES PRIOR TO YOUR FIRST MEAL to prevent ulcers and erosive gastritis.  YOU MAY USE IBUPROFEN OR NAPROXEN BUT YOU SHOULD ALWAYS TAKE OMEPRAZOLE WHEN USING THEM.  YOUR BIOPSY RESULTS WILL BE AVAILABLE IN MY CHART AFTER FEB 6 AND MY OFFICE WILL CONTACT YOU IN 10-14 DAYS WITH YOUR RESULTS.   FOLLOW UP IN 3 MOS.  UPPER ENDOSCOPY AFTER CARE Read the instructions outlined below and refer to this sheet in the next week. These discharge instructions provide you with general information on caring for yourself after you leave the hospital. While your treatment has been planned according to the most current medical practices available, unavoidable complications occasionally occur. If you have any problems or questions after discharge, call DR. Tanita Palinkas, [phone].  ACTIVITY  You may resume your regular activity, but move at a slower pace for the next 24 hours.   Take frequent rest periods for the next 24 hours.   Walking will help get rid of the air and reduce the bloated feeling in your belly (abdomen).   No driving for 24 hours (because of the medicine (anesthesia) used during the test).   You may shower.   Do not sign any important legal documents or operate any machinery for 24 hours (because of the anesthesia used during the test).    NUTRITION  Drink plenty of fluids.   You may resume your normal diet as instructed by your doctor.   Begin with a light meal and progress to your normal diet. Heavy or fried foods are harder to digest and may make you feel sick to your stomach (nauseated).   Avoid alcoholic beverages for 24 hours or as instructed.    MEDICATIONS  You may resume your normal medications.   WHAT YOU CAN EXPECT TODAY  Some feelings of bloating in the abdomen.     Passage of more gas than usual.    IF YOU HAD A BIOPSY TAKEN DURING THE UPPER ENDOSCOPY:  Eat a soft diet IF YOU HAVE NAUSEA, BLOATING, ABDOMINAL PAIN, OR VOMITING.    FINDING OUT THE RESULTS OF YOUR TEST Not all test results are available during your visit. DR. Darrick Penna WILL CALL YOU WITHIN 14 DAYS OF YOUR PROCEDUE WITH YOUR RESULTS. Do not assume everything is normal if you have not heard from DR. Jaysin Gayler, CALL HER OFFICE AT [phone].  SEEK IMMEDIATE MEDICAL ATTENTION AND CALL THE OFFICE: [phone] IF:  You have more than a spotting of blood in your stool.   Your belly is swollen (abdominal distention).   You are nauseated or vomiting.   You have a temperature over 101F.   You have abdominal pain or discomfort that is severe or gets worse throughout the day.  Gastritis  Gastritis is an inflammation (the body's way of reacting to injury and/or infection) of the stomach. It is often caused by viral or bacterial (germ) infections. It can also be caused BY ASPIRIN, BC/GOODY POWDER'S, (IBUPROFEN) MOTRIN, OR ALEVE (NAPROXEN), chemicals (including alcohol), SPICY FOODS, and medications. This illness may be associated with generalized malaise (feeling tired, not well), UPPER ABDOMINAL STOMACH cramps, and fever. One common bacterial cause of gastritis is an organism known as H. Pylori. This can be treated with antibiotics.   ESOPHAGEAL STRICTURE  Esophageal strictures can be caused by stomach acid  backing up into the tube that carries food from the mouth down to the stomach (lower esophagus).  TREATMENT There are a number of medicines used to treat reflux/stricture, including: Antacids.  Proton-pump inhibitors: OMEPRAZOLE  HOME CARE INSTRUCTIONS Eat 2-3 hours before going to bed.  Try to reach and maintain a healthy weight.  Do not eat just a few very large meals. Instead, eat 4 TO 6 smaller meals throughout the day.  Try to identify foods and beverages that make your symptoms  worse, and avoid these.  Avoid tight clothing.  Do not exercise right after eating.

## 2016-06-04 NOTE — H&P (View-Only) (Signed)
Subjective:    Patient ID: Rita Barry, female    DOB: [birth year], 51 y.o.   MRN: [id number]  [name], FNP  HPI FOR PAST 20 YEARS SHE FEELS LIKE THERE'S A GOLF BALL IN HER ESOPHAGUS. HAD BARIUM STUDY BEFORE AND WAS TOLD SHE WAS FINE. FOOD STOPS AND SLOWLY GOES AROUND THE OBSTRUCTION. NEVER HAD AN EGD. USED TO BE CERTAIN FOODS: EGGS, CHICKEN, RICE, BREAD MAINLY. ETOH MAKES IT EVEN WORSE THE NEXT DAY.HAS SENSATION A COUPLE TIMES A MONTH AND MAY LAST 30 MINS TO COUPLE HOURS ESPECIALLY AFTER EGGS. FLARES ARE NOT PREDICATBALE. WEIGHT LOSS: USU 165 LBS IS NORMAL WEIGHT. NO MANOMETRY. MAY SEE BLACK STOOL OCCASIONALLY. ABOUT 4 DAYS AGO SAW SOME. APPETITE: USES ADVIL SOMETIMES OR NAPROXEN. WAS A [employer]. RETIRED 14 YRS.   PT DENIES FEVER, CHILLS, HEMATOCHEZIA, nausea, vomiting, melena, diarrhea, CHEST PAIN, SHORTNESS OF BREATH,  CHANGE IN BOWEL IN HABITS, constipation, abdominal pain,  problems with sedation, OR heartburn or indigestion.  Past Medical History:  Diagnosis Date  . Anxiety   . Depression   . Fibromyalgia    Questionable    Past Surgical History:  Procedure Laterality Date  . COLONOSCOPY N/A 09/01/2015   Procedure: COLONOSCOPY;  Surgeon: West Bali, MD;  Location: [facility];  Service: Endoscopy;  Laterality: N/A;  9:45 AM  . SHOULDER SURGERY     Left  . SHOULDER SURGERY Left    x2 surgeries    Allergies  Allergen Reactions  . Penicillins         Current Outpatient Prescriptions  Medication Sig Dispense Refill  . albuterol (PROVENTIL HFA;VENTOLIN HFA) 108 (90 Base) MCG/ACT inhaler Inhale 2 puffs into the lungs every 6 (six) hours as needed for wheezing or shortness of breath.    . escitalopram (LEXAPRO) 20 MG tablet TAKE 1 TABLET (20 MG TOTAL) BY MOUTH DAILY.    . fexofenadine (ALLEGRA) 180 MG tablet Take 180 mg by mouth as needed. Reported on 08/25/2015    . fluticasone (FLONASE) 50 MCG/ACT nasal spray PLACE 2 SPRAYS INTO THE NOSE DAILY.    Marland Kitchen GARLIC  PO Take 1 tablet by mouth daily.    . hydrochlorothiazide (HYDRODIURIL) 12.5 MG tablet Take 1 tablet (12.5 mg total) by mouth daily.    . Ibuprofen-Diphenhydramine Cit (ADVIL PM PO) Take 1 tablet by mouth as needed. Reported on 08/25/2015    . Magnesium 250 MG TABS Take 1 tablet by mouth daily.    . Milk Thistle 250 MG CAPS Take 1 capsule by mouth daily.    . Multiple Vitamins-Minerals (WOMENS MULTI VITAMIN & MINERAL PO) Take 1 tablet by mouth daily.    . norethindrone (MICRONOR,CAMILA,ERRIN) 0.35 MG tablet Take 1 tablet (0.35 mg total) by mouth daily.    . Probiotic Product (PHILLIPS COLON HEALTH PO) Take 1 tablet by mouth daily.    . traMADol (ULTRAM) 50 MG tablet TAKE 1 TABLET BY MOUTH EVERY 6 HOURS AS NEEDED    . Turmeric 500 MG TABS Take 1 capsule by mouth daily.    . Vitamin D, Cholecalciferol, 1000 units CAPS Take 1 tablet by mouth daily.    .       Review of Systems PER HPI OTHERWISE ALL SYSTEMS ARE NEGATIVE.    Objective:   Physical Exam  Constitutional: She is oriented to person, place, and time. She appears well-developed and well-nourished. No distress.  HENT:  Head: Normocephalic and atraumatic.  Mouth/Throat: Oropharynx is clear and moist. No oropharyngeal  exudate.  Eyes: Pupils are equal, round, and reactive to light. No scleral icterus.  Neck: Normal range of motion. Neck supple.  Cardiovascular: Normal rate, regular rhythm and normal heart sounds.   Pulmonary/Chest: Effort normal and breath sounds normal. No respiratory distress.  Abdominal: Soft. Bowel sounds are normal. She exhibits no distension. There is tenderness. There is no rebound and no guarding.  MODERATE tenderness in the epigastrium   Musculoskeletal: She exhibits no edema.  Lymphadenopathy:    She has no cervical adenopathy.  Neurological: She is alert and oriented to person, place, and time.  NO FOCAL DEFICITS  Psychiatric:  SLIGHTLY ANXIOUS MOOD, NL AFFECT  Vitals reviewed.     Assessment & Plan:

## 2016-06-04 NOTE — Op Note (Signed)
[facility] Patient Name: Rita Barry Procedure Date: 06/04/2016 10:43 AM MRN: [id number] Date of Birth: 1965/07/02 Attending MD: Jonette Eva , MD CSN: [id number] Age: 51 Admit Type: Outpatient Procedure:                Upper GI endoscopy with esophageal dilation and                            cold forceps biopsy Indications:              Dysphagia Providers:                Jonette Eva, MD, Edrick Kins, RN, Birder Robson,                            Technician Referring MD:              Medicines:                Promethazine 25 mg IV, Meperidine 75 mg IV,                            Midazolam 5 mg IV Complications:            No immediate complications. Estimated Blood Loss:     Estimated blood loss was minimal. Procedure:                Pre-Anesthesia Assessment:                           - Prior to the procedure, a History and Physical                            was performed, and patient medications and                            allergies were reviewed. The patient's tolerance of                            previous anesthesia was also reviewed. The risks                            and benefits of the procedure and the sedation                            options and risks were discussed with the patient.                            All questions were answered, and informed consent                            was obtained. Prior Anticoagulants: The patient has                            taken ibuprofen. ASA Grade Assessment: II - A  patient with mild systemic disease. After reviewing                            the risks and benefits, the patient was deemed in                            satisfactory condition to undergo the procedure.                            After obtaining informed consent, the endoscope was                            passed under direct vision. Throughout the                            procedure, the patient's blood pressure,  pulse, and                            oxygen saturations were monitored continuously. The                            EG-[id number] ([id number]) scope was introduced through the                            mouth, and advanced to the second part of duodenum.                            The upper GI endoscopy was accomplished without                            difficulty. The patient tolerated the procedure                            well. Scope In: 11:07:32 AM Scope Out: 11:22:10 AM Total Procedure Duration: 0 hours 14 minutes 38 seconds  Findings:      One moderate (circumferential scarring or stenosis; an endoscope may       pass) benign-appearing, intrinsic stenosis was found. This measured 1.2       cm (inner diameter) and was traversed. A guidewire was placed and the       scope was withdrawn. Dilation was performed with a Savary dilator with       mild resistance at 12.8 mm, 14 mm, 15 mm, 16 mm and 17 mm. This was       biopsied with a cold forceps for evaluation of eosinophilic       esophagitis(20 CM AND 35 CM FROM THE INCISORS) EGJ 40 CM FROM THE       INCISORS.      Scattered moderate inflammation characterized by erosions and erythema       was found in the gastric antrum. Biopsies were taken with a cold forceps       for Helicobacter pylori testing.      Patchy mild inflammation characterized by congestion (edema), erythema       and friability was found in the duodenal bulb.      The second portion of  the duodenum was normal. Impression:               - Benign-appearing esophageal stenosis. Dilated.                           - MODERATE Gastritis and MILD Duodenitis. Moderate Sedation:      Moderate (conscious) sedation was administered by the endoscopy nurse       and supervised by the endoscopist. The following parameters were       monitored: oxygen saturation, heart rate, blood pressure, and response       to care. Total physician intraservice time was 31 minutes. Recommendation:            - Resume previous diet.                           - Continue present medications.                           - Await pathology results.                           - Return to my office in 3 months.                           - Patient has a contact number available for                            emergencies. The signs and symptoms of potential                            delayed complications were discussed with the                            patient. Return to normal activities tomorrow.                            Written discharge instructions were provided to the                            patient. Procedure Code(s):        --- Professional ---                           [id number], Esophagogastroduodenoscopy, flexible,                            transoral; with insertion of guide wire followed by                            passage of dilator(s) through esophagus over guide                            wire                           43239, Esophagogastroduodenoscopy, flexible,  transoral; with biopsy, single or multiple                           99152, Moderate sedation services provided by the                            same physician or other qualified health care                            professional performing the diagnostic or                            therapeutic service that the sedation supports,                            requiring the presence of an independent trained                            observer to assist in the monitoring of the                            patient's level of consciousness and physiological                            status; initial 15 minutes of intraservice time,                            patient age 47 years or older                           [phone], Moderate sedation services; each additional                            15 minutes intraservice time Diagnosis Code(s):        --- Professional ---                            K22.2, Esophageal obstruction                           K29.70, Gastritis, unspecified, without bleeding                           K29.80, Duodenitis without bleeding                           R13.10, Dysphagia, unspecified CPT copyright 2016 American Medical Association. All rights reserved. The codes documented in this report are preliminary and upon coder review may  be revised to meet current compliance requirements. Jonette Eva, MD Jonette Eva, MD 06/04/2016 12:12:40 PM This report has been signed electronically. Number of Addenda: 0

## 2016-06-11 ENCOUNTER — Encounter (HOSPITAL_COMMUNITY): Payer: Self-pay | Admitting: Gastroenterology

## 2016-06-13 ENCOUNTER — Encounter: Payer: Self-pay | Admitting: Gastroenterology

## 2016-06-13 DIAGNOSIS — K200 Eosinophilic esophagitis: Secondary | ICD-10-CM

## 2016-06-13 HISTORY — DX: Eosinophilic esophagitis: K20.0

## 2016-06-21 ENCOUNTER — Telehealth: Payer: Self-pay

## 2016-06-21 MED ORDER — OMEPRAZOLE 20 MG PO CPDR: 20.0000 mg | DELAYED_RELEASE_CAPSULE | Freq: Two times a day (BID) | ORAL | 3 refills | Status: DC

## 2016-06-21 NOTE — Telephone Encounter (Signed)
Pt called office and said that after her procedure was done, she was told to take Omeprazole BID, her original rx was for once a day. She has been taking BID and needs a refill, but pharmacy will not refill d/t current rx is once a day. She would [place] [facility] [place]

## 2016-06-21 NOTE — Telephone Encounter (Signed)
Done

## 2016-06-22 ENCOUNTER — Encounter: Payer: Self-pay | Admitting: Family

## 2016-06-22 ENCOUNTER — Ambulatory Visit (INDEPENDENT_AMBULATORY_CARE_PROVIDER_SITE_OTHER): Payer: Federal, State, Local not specified - PPO | Admitting: Family

## 2016-06-22 VITALS — BP 105/73 | HR 73 | Temp 98.1°F | Ht 67.5 in | Wt 176.8 lb

## 2016-06-22 DIAGNOSIS — Z0289 Encounter for other administrative examinations: Secondary | ICD-10-CM

## 2016-06-22 DIAGNOSIS — K2960 Other gastritis without bleeding: Secondary | ICD-10-CM | POA: Diagnosis not present

## 2016-06-22 DIAGNOSIS — J309 Allergic rhinitis, unspecified: Secondary | ICD-10-CM | POA: Diagnosis not present

## 2016-06-22 DIAGNOSIS — F411 Generalized anxiety disorder: Secondary | ICD-10-CM

## 2016-06-22 DIAGNOSIS — M797 Fibromyalgia: Secondary | ICD-10-CM | POA: Diagnosis not present

## 2016-06-22 DIAGNOSIS — E559 Vitamin D deficiency, unspecified: Secondary | ICD-10-CM | POA: Diagnosis not present

## 2016-06-22 DIAGNOSIS — F1120 Opioid dependence, uncomplicated: Secondary | ICD-10-CM | POA: Diagnosis not present

## 2016-06-22 DIAGNOSIS — F331 Major depressive disorder, recurrent, moderate: Secondary | ICD-10-CM

## 2016-06-22 DIAGNOSIS — E785 Hyperlipidemia, unspecified: Secondary | ICD-10-CM | POA: Diagnosis not present

## 2016-06-22 DIAGNOSIS — I10 Essential (primary) hypertension: Secondary | ICD-10-CM

## 2016-06-22 MED ORDER — TRAMADOL HCL 50 MG PO TABS: 50.0000 mg | ORAL_TABLET | Freq: Two times a day (BID) | ORAL | 2 refills | Status: DC | PRN

## 2016-06-22 NOTE — Progress Notes (Signed)
Subjective:    Patient ID: Rita Barry, female    DOB: October 17, 1965, 51 y.o.   MRN: [id number]   PT presents to the office today for chronic follow up. PT takes Ultram  BID for Fibromyalgia and states this seems to help a great deal.   Hypertension  This is a new problem. The current episode started more than 1 month ago. The problem has been resolved since onset. The problem is controlled. Associated symptoms include anxiety. Pertinent negatives include no palpitations, peripheral edema or shortness of breath. Risk factors for coronary artery disease include dyslipidemia, sedentary lifestyle and obesity. Past treatments include nothing. The current treatment provides no improvement. Compliance problems include diet.  There is no history of CAD/MI, CVA or heart failure.  Hyperlipidemia  This is a chronic problem. The current episode started more than 1 year ago. The problem is uncontrolled. Recent lipid tests were reviewed and are high. Exacerbating diseases include obesity. Pertinent negatives include no shortness of breath. Current antihyperlipidemic treatment includes diet change. The current treatment provides mild improvement of lipids. Risk factors for coronary artery disease include dyslipidemia, obesity and a sedentary lifestyle.  Knee Pain   The incident occurred more than 1 week ago. Injury mechanism: Pt knelt down and heard a "popping noise" The pain is present in the left knee. The quality of the pain is described as aching. The pain is at a severity of 1/10. The pain is mild. The pain has been fluctuating since onset. Associated symptoms include a loss of motion. Pertinent negatives include no muscle weakness or tingling. She reports no foreign bodies present. The symptoms are aggravated by movement. She has tried acetaminophen, rest, non-weight bearing and ice for the symptoms. The treatment provided mild relief.  Depression       The patient presents with depression.  This is a  chronic problem.  The current episode started more than 1 year ago.   The onset quality is gradual.   The problem occurs rarely.  The problem has been waxing and waning since onset.  Associated symptoms include no hopelessness, does not have insomnia, not irritable, no body aches, not sad and no suicidal ideas.  Past treatments include SSRIs - Selective serotonin reuptake inhibitors.  Compliance with treatment is good.  Previous treatment provided significant relief.  Past medical history includes anxiety and depression.   Anxiety  Presents for follow-up visit. Onset was more than 5 years ago. The problem has been waxing and waning. Patient reports no dizziness, excessive worry, insomnia, nervous/anxious behavior, palpitations, shortness of breath or suicidal ideas. Symptoms occur rarely. The quality of sleep is good.   Her past medical history is significant for anxiety/panic attacks and depression. Past treatments include SSRIs. The treatment provided significant relief. Compliance with prior treatments has been good.  Dizziness  This is a chronic problem. The current episode started more than 1 year ago. The problem occurs intermittently. The problem has been waxing and waning. The symptoms are aggravated by bending. She has tried lying down, position changes, relaxation and rest for the symptoms. The treatment provided significant relief.  Erosive Gastritis PT had upper endoscopy and was placed on omeprazole 20 mg and told to avoid NSAID's. PT states she is going better.   Pain assessment: Cause of pain- Fibromyalgia and knee pain Pain location- Left knee and fibromyalgia (generalized joint pain) Pain on scale of 1-10- 8 Frequency- intermittent What increases pain-Walking up steps What makes pain Better-Rest and pain medication Effects on  ADL -  Any change in general medical condition-N/A  Current medications- Ultram 50 mg BID Effectiveness of current meds-Doing well Adverse reactions form  pain meds-None Morphine equivalent  Pill count performed-No Urine drug screen- No, 08/01/15 Was the [place] reviewed- 08/01/15  If yes were their any concerning findings? - None, only received medication from me   Review of Systems  Constitutional: Negative.   HENT: Negative.   Eyes: Negative.   Respiratory: Negative.  Negative for shortness of breath.   Cardiovascular: Negative.  Negative for palpitations.  Gastrointestinal: Negative.   Endocrine: Negative.   Genitourinary: Negative.   Musculoskeletal: Negative.   Neurological: Negative.  Negative for dizziness and tingling.  Hematological: Negative.   Psychiatric/Behavioral: Positive for depression. Negative for suicidal ideas. The patient is not nervous/anxious and does not have insomnia.   All other systems reviewed and are negative.      Objective:   Physical Exam  Constitutional: She is oriented to person, place, and time. She appears well-developed and well-nourished. She is not irritable. No distress.  HENT:  Head: Normocephalic and atraumatic.  Right Ear: External ear normal.  Left Ear: External ear normal.  Nose: Nose normal.  Mouth/Throat: Oropharynx is clear and moist.  Eyes: Pupils are equal, round, and reactive to light.  Neck: Normal range of motion. Neck supple. No thyromegaly present.  Cardiovascular: Normal rate, regular rhythm, normal heart sounds and intact distal pulses.   No murmur heard. Pulmonary/Chest: Effort normal and breath sounds normal. No respiratory distress. She has no wheezes.  Abdominal: Soft. Bowel sounds are normal. She exhibits no distension. There is no tenderness.  Genitourinary:  Genitourinary Comments:    Musculoskeletal: Normal range of motion. She exhibits no edema or tenderness.  Neurological: She is alert and oriented to person, place, and time. She has normal reflexes. No cranial nerve deficit.  Skin: Skin is warm and dry.  Psychiatric: She has a normal mood and affect. Her  behavior is normal. Judgment and thought content normal.  Vitals reviewed.    BP 105/73   Pulse 73   Temp 98.1 F (36.7 C) (Oral)   Ht 5' 7.5" (1.715 m)   Wt 176 lb 12.8 oz (80.2 kg)   BMI 27.28 kg/m      Assessment & Plan:  1. Essential hypertension - CMP14+EGFR  2. Allergic rhinitis, unspecified chronicity, unspecified seasonality, unspecified trigger - CMP14+EGFR  3. Moderate episode of recurrent major depressive disorder (HCC) - CMP14+EGFR  4. Fibromyalgia - traMADol (ULTRAM) 50 MG tablet; Take 1 tablet (50 mg total) by mouth every 12 (twelve) hours as needed. Takes 29m every morning. Takes 574min the evening if needed for pain.  Dispense: 60 tablet; Refill: 2 - CMP14+EGFR  5. GAD (generalized anxiety disorder) - CMP14+EGFR  6. Hyperlipidemia, unspecified hyperlipidemia type - CMP14+EGFR  7. Vitamin D deficiency - CMP14+EGFR  8. Pain medication agreement signed - traMADol (ULTRAM) 50 MG tablet; Take 1 tablet (50 mg total) by mouth every 12 (twelve) hours as needed. Takes 5053mvery morning. Takes 32m71m the evening if needed for pain.  Dispense: 60 tablet; Refill: 2 - CMP14+EGFR  9. Uncomplicated opioid dependence (HCC) - traMADol (ULTRAM) 50 MG tablet; Take 1 tablet (50 mg total) by mouth every 12 (twelve) hours as needed. Takes 32mg34mry morning. Takes 32mg 18mhe evening if needed for pain.  Dispense: 60 tablet; Refill: 2 - CMP14+EGFR  10. Erosive gastritis - CMP14+EGFR   Continue all meds Labs pending Health Maintenance reviewed Diet  and exercise encouraged RTO 3 months   [name], FNP

## 2016-06-22 NOTE — Patient Instructions (Signed)

## 2016-06-23 LAB — CMP14+EGFR
ALT: 22 IU/L (ref 0–32)
AST: 26 IU/L (ref 0–40)
Albumin/Globulin Ratio: 1.9 (ref 1.2–2.2)
Albumin: 4.5 g/dL (ref 3.5–5.5)
Alkaline Phosphatase: 20 IU/L — ABNORMAL LOW (ref 39–117)
BUN/Creatinine Ratio: 23 (ref 9–23)
BUN: 17 mg/dL (ref 6–24)
Bilirubin Total: 0.4 mg/dL (ref 0.0–1.2)
CO2: 26 mmol/L (ref 18–29)
Calcium: 9.7 mg/dL (ref 8.7–10.2)
Chloride: 98 mmol/L (ref 96–106)
Creatinine, Ser: 0.75 mg/dL (ref 0.57–1.00)
GFR calc Af Amer: 107 (ref >59)
GFR calc non Af Amer: 93 (ref >59)
Globulin, Total: 2.4 (ref 1.5–4.5)
Glucose: 90 mg/dL (ref 65–99)
Potassium: 5 mmol/L (ref 3.5–5.2)
Sodium: 141 mmol/L (ref 134–144)
Total Protein: 6.9 g/dL (ref 6.0–8.5)

## 2016-06-28 ENCOUNTER — Other Ambulatory Visit: Payer: Self-pay | Admitting: Family

## 2016-06-28 DIAGNOSIS — R062 Wheezing: Secondary | ICD-10-CM

## 2016-06-28 DIAGNOSIS — J309 Allergic rhinitis, unspecified: Secondary | ICD-10-CM

## 2016-07-16 ENCOUNTER — Other Ambulatory Visit: Payer: Self-pay | Admitting: Family

## 2016-07-21 ENCOUNTER — Telehealth: Payer: Self-pay | Admitting: Gastroenterology

## 2016-07-21 NOTE — Telephone Encounter (Signed)
I spoke to pt and she has been taking the Omeprazole bid for over a month. She just started this rash about 4-5 days ago, all over her torso, back and arms. She has not added any new medications, no new detergents or anything. She wants to know if it is OK to stop all at once or does she need to taper. I told her she can stop now if she feels like this is what the rash is coming from.  She said she does not have reflux, no acid coming back up and thinks she might not need another medication. Please advise!

## 2016-07-21 NOTE — Telephone Encounter (Signed)
PLEASE CALL PT. THE RASH MAY OR MAY NOT BE DUE TO OMEPRAZOLE. IT IS UNLIKELY TO CAUSE ARASH AFTER TAKING IT FOR ONE MO. IF SHE IS NOT HAVING REFLUX SHE CAN STOP THE OMEPRAZOLE. AVOID REFLUX TRIGGERS AND SEE IF THE RASH GETS BETTER OR SEE HER PCP FOR ADDITIONAL EVALUATION.

## 2016-07-21 NOTE — Telephone Encounter (Signed)
Pt called to say that she has been taking omeprazole and now she has developed a rash and its getting worse. Please advise. I transferred her call to [facility] [phone]

## 2016-07-21 NOTE — Telephone Encounter (Signed)
Pt is aware.  

## 2016-08-03 ENCOUNTER — Encounter: Payer: Self-pay | Admitting: Gastroenterology

## 2016-08-17 ENCOUNTER — Other Ambulatory Visit: Payer: Self-pay

## 2016-08-17 MED ORDER — NORETHINDRONE 0.35 MG PO TABS: 1.0000 | ORAL_TABLET | Freq: Every day | ORAL | 0 refills | Status: DC

## 2016-09-20 ENCOUNTER — Ambulatory Visit: Payer: Federal, State, Local not specified - PPO | Admitting: Family

## 2016-09-30 ENCOUNTER — Encounter: Payer: Self-pay | Admitting: Family

## 2016-09-30 ENCOUNTER — Ambulatory Visit (INDEPENDENT_AMBULATORY_CARE_PROVIDER_SITE_OTHER): Payer: Federal, State, Local not specified - PPO | Admitting: Family

## 2016-09-30 VITALS — BP 128/81 | HR 78 | Temp 97.8°F | Ht 67.5 in | Wt 166.4 lb

## 2016-09-30 DIAGNOSIS — F411 Generalized anxiety disorder: Secondary | ICD-10-CM | POA: Diagnosis not present

## 2016-09-30 DIAGNOSIS — I10 Essential (primary) hypertension: Secondary | ICD-10-CM

## 2016-09-30 DIAGNOSIS — F1120 Opioid dependence, uncomplicated: Secondary | ICD-10-CM

## 2016-09-30 DIAGNOSIS — Z0289 Encounter for other administrative examinations: Secondary | ICD-10-CM

## 2016-09-30 DIAGNOSIS — E785 Hyperlipidemia, unspecified: Secondary | ICD-10-CM

## 2016-09-30 DIAGNOSIS — E559 Vitamin D deficiency, unspecified: Secondary | ICD-10-CM | POA: Diagnosis not present

## 2016-09-30 DIAGNOSIS — F331 Major depressive disorder, recurrent, moderate: Secondary | ICD-10-CM

## 2016-09-30 DIAGNOSIS — M797 Fibromyalgia: Secondary | ICD-10-CM | POA: Diagnosis not present

## 2016-09-30 MED ORDER — TRAMADOL HCL 50 MG PO TABS: 50.0000 mg | ORAL_TABLET | Freq: Two times a day (BID) | ORAL | 2 refills | Status: DC | PRN

## 2016-09-30 NOTE — Patient Instructions (Signed)
Fat and Cholesterol Restricted Diet High levels of fat and cholesterol in your blood may lead to various health problems, such as diseases of the heart, blood vessels, gallbladder, liver, and pancreas. Fats are concentrated sources of energy that come in various forms. Certain types of fat, including saturated fat, may be harmful in excess. Cholesterol is a substance needed by your body in small amounts. Your body makes all the cholesterol it needs. Excess cholesterol comes from the food you eat. When you have high levels of cholesterol and saturated fat in your blood, health problems can develop because the excess fat and cholesterol will gather along the walls of your blood vessels, causing them to narrow. Choosing the right foods will help you control your intake of fat and cholesterol. This will help keep the levels of these substances in your blood within normal limits and reduce your risk of disease. What is my plan? Your health care provider recommends that you:  Limit your fat intake to ______% or less of your total calories per day.  Limit the amount of cholesterol in your diet to less than _________mg per day.  Eat 20-30 grams of fiber each day.  What types of fat should I choose?  Choose healthy fats more often. Choose monounsaturated and polyunsaturated fats, such as olive and canola oil, flaxseeds, walnuts, almonds, and seeds.  Eat more omega-3 fats. Good choices include salmon, mackerel, sardines, tuna, flaxseed oil, and ground flaxseeds. Aim to eat fish at least two times a week.  Limit saturated fats. Saturated fats are primarily found in animal products, such as meats, butter, and cream. Plant sources of saturated fats include palm oil, palm kernel oil, and coconut oil.  Avoid foods with partially hydrogenated oils in them. These contain trans fats. Examples of foods that contain trans fats are stick margarine, some tub margarines, cookies, crackers, and other baked goods. What  general guidelines do I need to follow? These guidelines for healthy eating will help you control your intake of fat and cholesterol:  Check food labels carefully to identify foods with trans fats or high amounts of saturated fat.  Fill one half of your plate with vegetables and green salads.  Fill one fourth of your plate with whole grains. Look for the word "whole" as the first word in the ingredient list.  Fill one fourth of your plate with lean protein foods.  Limit fruit to two servings a day. Choose fruit instead of juice.  Eat more foods that contain fiber, such as apples, broccoli, carrots, beans, peas, and barley.  Eat more home-cooked food and less restaurant, buffet, and fast food.  Limit or avoid alcohol.  Limit foods high in starch and sugar.  Limit fried foods.  Cook foods using methods other than frying. Baking, boiling, grilling, and broiling are all great options.  Lose weight if you are overweight. Losing just 5-10% of your initial body weight can help your overall health and prevent diseases such as diabetes and heart disease.  What foods can I eat? Grains  Whole grains, such as whole wheat or whole grain breads, crackers, cereals, and pasta. Unsweetened oatmeal, bulgur, barley, quinoa, or brown rice. Corn or whole wheat flour tortillas. Vegetables  Fresh or frozen vegetables (raw, steamed, roasted, or grilled). Green salads. Fruits  All fresh, canned (in natural juice), or frozen fruits. Meats and other protein foods  Ground beef (85% or leaner), grass-fed beef, or beef trimmed of fat. Skinless chicken or turkey. Ground chicken or turkey.   Pork trimmed of fat. All fish and seafood. Eggs. Dried beans, peas, or lentils. Unsalted nuts or seeds. Unsalted canned or dry beans. Dairy  Low-fat dairy products, such as skim or 1% milk, 2% or reduced-fat cheeses, low-fat ricotta or cottage cheese, or plain low-fat yo Fats and oils  Tub margarines without trans  fats. Light or reduced-fat mayonnaise and salad dressings. Avocado. Olive, canola, sesame, or safflower oils. Natural peanut or almond butter (choose ones without added sugar and oil). The items listed above may not be a complete list of recommended foods or beverages. Contact your dietitian for more options. Foods to avoid Grains  White bread. White pasta. White rice. Cornbread. Bagels, pastries, and croissants. Crackers that contain trans fat. Vegetables  White potatoes. Corn. Creamed or fried vegetables. Vegetables in a cheese sauce. Fruits  Dried fruits. Canned fruit in light or heavy syrup. Fruit juice. Meats and other protein foods  Fatty cuts of meat. Ribs, chicken wings, bacon, sausage, bologna, salami, chitterlings, fatback, hot dogs, bratwurst, and packaged luncheon meats. Liver and organ meats. Dairy  Whole or 2% milk, cream, half-and-half, and cream cheese. Whole milk cheeses. Whole-fat or sweetened yogurt. Full-fat cheeses. Nondairy creamers and whipped toppings. Processed cheese, cheese spreads, or cheese curds. Beverages  Alcohol. Sweetened drinks (such as sodas, lemonade, and fruit drinks or punches). Fats and oils  Butter, stick margarine, lard, shortening, ghee, or bacon fat. Coconut, palm kernel, or palm oils. Sweets and desserts  Corn syrup, sugars, honey, and molasses. Candy. Jam and jelly. Syrup. Sweetened cereals. Cookies, pies, cakes, donuts, muffins, and ice cream. The items listed above may not be a complete list of foods and beverages to avoid. Contact your dietitian for more information. This information is not intended to replace advice given to you by your health care provider. Make sure you discuss any questions you have with your health care provider. Document Released: 04/19/2005 Document Revised: 05/10/2014 Document Reviewed: 07/18/2013 Elsevier Interactive Patient Education  2017 [employer]  

## 2016-09-30 NOTE — Progress Notes (Signed)
Subjective:    Patient ID: Rita Barry, female    DOB: [birth year], 51 y.o.   MRN: [id number]  PT presents to the office today for chronic follow up. PT takes Ultram  BID for Fibromyalgia and states this seems to help a great deal. Hypertension  This is a chronic problem. The current episode started more than 1 year ago. The problem has been resolved since onset. The problem is controlled. Associated symptoms include anxiety. Pertinent negatives include no palpitations, peripheral edema or shortness of breath. The current treatment provides moderate improvement. There is no history of kidney disease, CAD/MI, CVA or heart failure.  Depression         This is a chronic problem.  The current episode started more than 1 year ago.   The onset quality is gradual.   The problem occurs rarely.  The problem has been waxing and waning since onset.  Associated symptoms include sad.  Associated symptoms include no helplessness, no hopelessness, no decreased interest and no myalgias.     The symptoms are aggravated by work stress.  Past medical history includes anxiety.   Anxiety  Presents for follow-up visit. Symptoms include excessive worry. Patient reports no irritability, nervous/anxious behavior, palpitations or shortness of breath. Symptoms occur occasionally.    Hyperlipidemia  This is a chronic problem. The current episode started more than 1 year ago. The problem is uncontrolled. Recent lipid tests were reviewed and are high. Pertinent negatives include no myalgias or shortness of breath. Current antihyperlipidemic treatment includes diet change. The current treatment provides mild improvement of lipids. Risk factors for coronary artery disease include dyslipidemia and hypertension.      Review of Systems  Constitutional: Negative for irritability.  Respiratory: Negative for shortness of breath.   Cardiovascular: Negative for palpitations.  Musculoskeletal: Negative for myalgias.    Psychiatric/Behavioral: Positive for depression. The patient is not nervous/anxious.   All other systems reviewed and are negative.      Objective:   Physical Exam  Constitutional: She is oriented to person, place, and time. She appears well-developed and well-nourished. No distress.  HENT:  Head: Normocephalic and atraumatic.  Right Ear: External ear normal.  Left Ear: External ear normal.  Nose: Nose normal.  Mouth/Throat: Oropharynx is clear and moist.  Eyes: Pupils are equal, round, and reactive to light.  Neck: Normal range of motion. Neck supple. No thyromegaly present.  Cardiovascular: Normal rate, regular rhythm, normal heart sounds and intact distal pulses.   No murmur heard. Pulmonary/Chest: Effort normal and breath sounds normal. No respiratory distress. She has no wheezes.  Abdominal: Soft. Bowel sounds are normal. She exhibits no distension. There is no tenderness.  Musculoskeletal: Normal range of motion. She exhibits no edema or tenderness.  Neurological: She is alert and oriented to person, place, and time.  Skin: Skin is warm and dry.  Psychiatric: She has a normal mood and affect. Her behavior is normal. Judgment and thought content normal.  Vitals reviewed.     BP 128/81   Pulse 78   Temp 97.8 F (36.6 C) (Oral)   Ht 5' 7.5" (1.715 m)   Wt 166 lb 6.4 oz (75.5 kg)   BMI 25.68 kg/m      Assessment & Plan:  1. Essential hypertension  2. Vitamin D deficiency  3. Pain medication agreement signed - traMADol (ULTRAM) 50 MG tablet; Take 1 tablet (50 mg total) by mouth every 12 (twelve) hours as needed. Takes 50mg  every morning. Takes 50mg   in the evening if needed for pain.  Dispense: 60 tablet; Refill: 2  4. Uncomplicated opioid dependence (HCC) - traMADol (ULTRAM) 50 MG tablet; Take 1 tablet (50 mg total) by mouth every 12 (twelve) hours as needed. Takes 50mg  every morning. Takes 50mg  in the evening if needed for pain.  Dispense: 60 tablet; Refill:  2  5. Hyperlipidemia, unspecified hyperlipidemia type  6. GAD (generalized anxiety disorder)  7. Fibromyalgia - traMADol (ULTRAM) 50 MG tablet; Take 1 tablet (50 mg total) by mouth every 12 (twelve) hours as needed. Takes 50mg  every morning. Takes 50mg  in the evening if needed for pain.  Dispense: 60 tablet; Refill: 2  8. Moderate episode of recurrent major depressive disorder (HCC)  *Pt reviewed in [place] controlled database- Pt has only received controlled medications from me.   Continue all meds Will hold off on labs today and do them on next visit  Health Maintenance reviewed Diet and exercise encouraged RTO 3 months   Jannifer Rodneyhristy Champion Corales, FNP

## 2016-10-07 DIAGNOSIS — K080 Exfoliation of teeth due to systemic causes: Secondary | ICD-10-CM | POA: Diagnosis not present

## 2016-11-08 ENCOUNTER — Other Ambulatory Visit: Payer: Self-pay | Admitting: Family

## 2016-11-08 DIAGNOSIS — I10 Essential (primary) hypertension: Secondary | ICD-10-CM

## 2016-12-25 ENCOUNTER — Other Ambulatory Visit: Payer: Self-pay | Admitting: Family

## 2017-01-02 ENCOUNTER — Other Ambulatory Visit: Payer: Self-pay | Admitting: Family

## 2017-01-06 ENCOUNTER — Ambulatory Visit (INDEPENDENT_AMBULATORY_CARE_PROVIDER_SITE_OTHER): Payer: Federal, State, Local not specified - PPO | Admitting: Family

## 2017-01-06 ENCOUNTER — Encounter: Payer: Self-pay | Admitting: Family

## 2017-01-06 VITALS — BP 134/89 | HR 74 | Temp 98.8°F | Ht 67.5 in | Wt 167.0 lb

## 2017-01-06 DIAGNOSIS — M545 Low back pain, unspecified: Secondary | ICD-10-CM

## 2017-01-06 DIAGNOSIS — B373 Candidiasis of vulva and vagina: Secondary | ICD-10-CM | POA: Diagnosis not present

## 2017-01-06 DIAGNOSIS — R3121 Asymptomatic microscopic hematuria: Secondary | ICD-10-CM

## 2017-01-06 DIAGNOSIS — M797 Fibromyalgia: Secondary | ICD-10-CM

## 2017-01-06 DIAGNOSIS — F331 Major depressive disorder, recurrent, moderate: Secondary | ICD-10-CM | POA: Diagnosis not present

## 2017-01-06 DIAGNOSIS — I10 Essential (primary) hypertension: Secondary | ICD-10-CM

## 2017-01-06 DIAGNOSIS — E785 Hyperlipidemia, unspecified: Secondary | ICD-10-CM | POA: Diagnosis not present

## 2017-01-06 DIAGNOSIS — Z0289 Encounter for other administrative examinations: Secondary | ICD-10-CM

## 2017-01-06 DIAGNOSIS — F1120 Opioid dependence, uncomplicated: Secondary | ICD-10-CM | POA: Diagnosis not present

## 2017-01-06 DIAGNOSIS — F411 Generalized anxiety disorder: Secondary | ICD-10-CM

## 2017-01-06 DIAGNOSIS — B3731 Acute candidiasis of vulva and vagina: Secondary | ICD-10-CM

## 2017-01-06 DIAGNOSIS — E559 Vitamin D deficiency, unspecified: Secondary | ICD-10-CM | POA: Diagnosis not present

## 2017-01-06 LAB — CMP14+EGFR
ALT: 27 IU/L (ref 0–32)
AST: 33 IU/L (ref 0–40)
Albumin/Globulin Ratio: 1.6 (ref 1.2–2.2)
Albumin: 4.5 g/dL (ref 3.5–5.5)
Alkaline Phosphatase: 27 IU/L — ABNORMAL LOW (ref 39–117)
BUN/Creatinine Ratio: 13 (ref 9–23)
BUN: 10 mg/dL (ref 6–24)
Bilirubin Total: 0.5 mg/dL (ref 0.0–1.2)
CO2: 22 mmol/L (ref 20–29)
Calcium: 9.9 mg/dL (ref 8.7–10.2)
Chloride: 98 mmol/L (ref 96–106)
Creatinine, Ser: 0.79 mg/dL (ref 0.57–1.00)
GFR calc Af Amer: 100 mL/min/{1.73_m2} (ref >59)
GFR calc non Af Amer: 87 mL/min/{1.73_m2} (ref >59)
Globulin, Total: 2.8 g/dL (ref 1.5–4.5)
Glucose: 107 mg/dL — ABNORMAL HIGH (ref 65–99)
Potassium: 4.2 mmol/L (ref 3.5–5.2)
Sodium: 137 mmol/L (ref 134–144)
Total Protein: 7.3 g/dL (ref 6.0–8.5)

## 2017-01-06 LAB — CBC WITH DIFFERENTIAL/PLATELET
Basophils Absolute: 0.1 10*3/uL (ref 0.0–0.2)
Basos: 1 %
EOS (ABSOLUTE): 0.7 10*3/uL — ABNORMAL HIGH (ref 0.0–0.4)
Eos: 10 %
Hematocrit: 38.3 % (ref 34.0–46.6)
Hemoglobin: 13.1 g/dL (ref 11.1–15.9)
Immature Grans (Abs): 0 10*3/uL (ref 0.0–0.1)
Immature Granulocytes: 0 %
Lymphocytes Absolute: 1.2 10*3/uL (ref 0.7–3.1)
Lymphs: 18 %
MCH: 33.9 pg — ABNORMAL HIGH (ref 26.6–33.0)
MCHC: 34.2 g/dL (ref 31.5–35.7)
MCV: 99 fL — ABNORMAL HIGH (ref 79–97)
Monocytes Absolute: 0.3 10*3/uL (ref 0.1–0.9)
Monocytes: 5 %
Neutrophils Absolute: 4.4 10*3/uL (ref 1.4–7.0)
Neutrophils: 66 %
Platelets: 406 10*3/uL — ABNORMAL HIGH (ref 150–379)
RBC: 3.86 x10E6/uL (ref 3.77–5.28)
RDW: 14.2 % (ref 12.3–15.4)
WBC: 6.7 10*3/uL (ref 3.4–10.8)

## 2017-01-06 LAB — MICROSCOPIC EXAMINATION
RBC, UA: >30 /hpf — AB (ref 0–?)
Renal Epithel, UA: NONE SEEN /hpf

## 2017-01-06 LAB — URINALYSIS, COMPLETE
Bilirubin, UA: NEGATIVE
Glucose, UA: NEGATIVE
Ketones, UA: NEGATIVE
Leukocytes, UA: NEGATIVE
Nitrite, UA: NEGATIVE
Protein, UA: NEGATIVE
Specific Gravity, UA: 1.015 (ref 1.005–1.030)
Urobilinogen, Ur: 0.2 mg/dL (ref 0.2–1.0)
pH, UA: 7 (ref 5.0–7.5)

## 2017-01-06 MED ORDER — TRAMADOL HCL 50 MG PO TABS: 50.0000 mg | ORAL_TABLET | Freq: Two times a day (BID) | ORAL | 2 refills | Status: DC | PRN

## 2017-01-06 MED ORDER — FLUCONAZOLE 150 MG PO TABS: 150.0000 mg | ORAL_TABLET | ORAL | 0 refills | Status: DC | PRN

## 2017-01-06 NOTE — Patient Instructions (Signed)

## 2017-01-06 NOTE — Progress Notes (Signed)
Subjective:    Patient ID: Rita Barry, female    DOB: 1965/11/21, 51 y.o.   MRN: [id number]  PT presents to the office today for chronic follow up. PT takes Ultram BID for Fibromyalgia and states this seems to help a great deal. Hypertension  This is a chronic problem. The current episode started more than 1 year ago. The problem has been resolved since onset. The problem is controlled. Associated symptoms include anxiety. Pertinent negatives include no malaise/fatigue, peripheral edema or shortness of breath. Risk factors for coronary artery disease include dyslipidemia, obesity and sedentary lifestyle. The current treatment provides moderate improvement. There is no history of kidney disease, CAD/MI, CVA or heart failure.  Gastroesophageal Reflux  She reports no belching, no coughing, no heartburn or no nausea. This is a chronic problem. The current episode started more than 1 year ago. The problem occurs occasionally. Risk factors include obesity. The treatment provided moderate relief.  Hyperlipidemia  This is a chronic problem. The current episode started more than 1 year ago. The problem is uncontrolled. Recent lipid tests were reviewed and are high. Exacerbating diseases include obesity. Pertinent negatives include no shortness of breath. Current antihyperlipidemic treatment includes diet change. The current treatment provides mild improvement of lipids. Risk factors for coronary artery disease include dyslipidemia, obesity and post-menopausal.  Anxiety  Presents for follow-up visit. Symptoms include excessive worry and nervous/anxious behavior. Patient reports no decreased concentration, irritability, nausea or shortness of breath. Symptoms occur occasionally. The quality of sleep is good.    Dysuria   This is a new problem. The current episode started 1 to 4 weeks ago. The problem occurs intermittently. The problem has been gradually improving. The quality of the pain is described  as burning. The pain is at a severity of 2/10. The pain is mild. Pertinent negatives include no flank pain, frequency, hematuria, hesitancy, nausea, urgency or vomiting. She has tried increased fluids for the symptoms. The treatment provided mild relief.      Review of Systems  Constitutional: Negative for irritability and malaise/fatigue.  Respiratory: Negative for cough and shortness of breath.   Gastrointestinal: Negative for heartburn, nausea and vomiting.  Genitourinary: Positive for dysuria. Negative for flank pain, frequency, hematuria, hesitancy and urgency.  Psychiatric/Behavioral: Negative for decreased concentration. The patient is nervous/anxious.   All other systems reviewed and are negative.      Objective:   Physical Exam  Constitutional: She is oriented to person, place, and time. She appears well-developed and well-nourished. No distress.  HENT:  Head: Normocephalic and atraumatic.  Right Ear: External ear normal.  Left Ear: External ear normal.  Nose: Nose normal.  Mouth/Throat: Oropharynx is clear and moist.  Eyes: Pupils are equal, round, and reactive to light.  Neck: Normal range of motion. Neck supple. No thyromegaly present.  Cardiovascular: Normal rate, regular rhythm, normal heart sounds and intact distal pulses.   No murmur heard. Pulmonary/Chest: Effort normal and breath sounds normal. No respiratory distress. She has no wheezes.  Abdominal: Soft. Bowel sounds are normal. She exhibits no distension. There is no tenderness.  Musculoskeletal: Normal range of motion. She exhibits no edema or tenderness.  Neurological: She is alert and oriented to person, place, and time.  Skin: Skin is warm and dry.  Psychiatric: She has a normal mood and affect. Her behavior is normal. Judgment and thought content normal.  Vitals reviewed.     BP 134/89   Pulse 74   Temp 98.8 F (37.1 C) (Oral)  Ht 5' 7.5" (1.715 m)   Wt 167 lb (75.8 kg)   BMI 25.77 kg/m        Assessment & Plan:  1. Acute low back pain without sciatica, unspecified back pain laterality - Urinalysis, Complete - CMP14+EGFR  2. GAD (generalized anxiety disorder) - CMP14+EGFR  3. Hyperlipidemia, unspecified hyperlipidemia type - CMP14+EGFR  4. Uncomplicated opioid dependence (HCC) - traMADol (ULTRAM) 50 MG tablet; Take 1 tablet (50 mg total) by mouth every 12 (twelve) hours as needed. Takes 39m every morning. Takes 539min the evening if needed for pain.  Dispense: 60 tablet; Refill: 2 - CMP14+EGFR  5. Pain medication agreement signed - traMADol (ULTRAM) 50 MG tablet; Take 1 tablet (50 mg total) by mouth every 12 (twelve) hours as needed. Takes 5047mvery morning. Takes 58m29m the evening if needed for pain.  Dispense: 60 tablet; Refill: 2 - CMP14+EGFR  6. Vitamin D deficiency - CMP14+EGFR  7. Fibromyalgia - traMADol (ULTRAM) 50 MG tablet; Take 1 tablet (50 mg total) by mouth every 12 (twelve) hours as needed. Takes 58mg45mry morning. Takes 58mg 35mhe evening if needed for pain.  Dispense: 60 tablet; Refill: 2 - CMP14+EGFR  8. Essential hypertension - CMP14+EGFR  9. Moderate episode of recurrent major depressive disorder (HCC) - CMP14+EGFR  10. Asymptomatic microscopic hematuria - Ambulatory referral to Urology - CBC with Differential/Platelet - CMP14+EGFR  11. Vaginal candidiasis Keep Clean and dry - fluconazole (DIFLUCAN) 150 MG tablet; Take 1 tablet (150 mg total) by mouth every three (3) days as needed.  Dispense: 3 tablet; Refill: 0   Continue all meds Labs pending Health Maintenance reviewed Diet and exercise encouraged RTO 3 months for CPE  ChristEvelina Dun

## 2017-02-24 DIAGNOSIS — R351 Nocturia: Secondary | ICD-10-CM | POA: Diagnosis not present

## 2017-02-24 DIAGNOSIS — N393 Stress incontinence (female) (male): Secondary | ICD-10-CM | POA: Diagnosis not present

## 2017-02-24 DIAGNOSIS — R3121 Asymptomatic microscopic hematuria: Secondary | ICD-10-CM | POA: Diagnosis not present

## 2017-02-24 DIAGNOSIS — R350 Frequency of micturition: Secondary | ICD-10-CM | POA: Diagnosis not present

## 2017-03-14 DIAGNOSIS — S0502XA Injury of conjunctiva and corneal abrasion without foreign body, left eye, initial encounter: Secondary | ICD-10-CM | POA: Diagnosis not present

## 2017-03-16 DIAGNOSIS — R3121 Asymptomatic microscopic hematuria: Secondary | ICD-10-CM | POA: Diagnosis not present

## 2017-03-16 DIAGNOSIS — R3129 Other microscopic hematuria: Secondary | ICD-10-CM | POA: Diagnosis not present

## 2017-03-20 ENCOUNTER — Other Ambulatory Visit: Payer: Self-pay | Admitting: Family

## 2017-04-07 ENCOUNTER — Encounter: Payer: Federal, State, Local not specified - PPO | Admitting: Family

## 2017-04-13 DIAGNOSIS — K080 Exfoliation of teeth due to systemic causes: Secondary | ICD-10-CM | POA: Diagnosis not present

## 2017-04-15 ENCOUNTER — Encounter: Payer: Self-pay | Admitting: Family

## 2017-04-15 ENCOUNTER — Ambulatory Visit (INDEPENDENT_AMBULATORY_CARE_PROVIDER_SITE_OTHER): Payer: Federal, State, Local not specified - PPO | Admitting: Family

## 2017-04-15 VITALS — BP 139/87 | HR 77 | Temp 98.2°F | Ht 67.5 in | Wt 173.6 lb

## 2017-04-15 DIAGNOSIS — M797 Fibromyalgia: Secondary | ICD-10-CM

## 2017-04-15 DIAGNOSIS — N952 Postmenopausal atrophic vaginitis: Secondary | ICD-10-CM

## 2017-04-15 DIAGNOSIS — I10 Essential (primary) hypertension: Secondary | ICD-10-CM

## 2017-04-15 DIAGNOSIS — F411 Generalized anxiety disorder: Secondary | ICD-10-CM

## 2017-04-15 DIAGNOSIS — F331 Major depressive disorder, recurrent, moderate: Secondary | ICD-10-CM

## 2017-04-15 DIAGNOSIS — E785 Hyperlipidemia, unspecified: Secondary | ICD-10-CM | POA: Diagnosis not present

## 2017-04-15 DIAGNOSIS — Z01419 Encounter for gynecological examination (general) (routine) without abnormal findings: Secondary | ICD-10-CM

## 2017-04-15 DIAGNOSIS — Z0000 Encounter for general adult medical examination without abnormal findings: Secondary | ICD-10-CM | POA: Diagnosis not present

## 2017-04-15 DIAGNOSIS — F1120 Opioid dependence, uncomplicated: Secondary | ICD-10-CM

## 2017-04-15 DIAGNOSIS — Z0289 Encounter for other administrative examinations: Secondary | ICD-10-CM

## 2017-04-15 LAB — URINALYSIS, COMPLETE
Bilirubin, UA: NEGATIVE
Glucose, UA: NEGATIVE
Ketones, UA: NEGATIVE
Leukocytes, UA: NEGATIVE
Nitrite, UA: NEGATIVE
Protein, UA: NEGATIVE
Specific Gravity, UA: 1.015 (ref 1.005–1.030)
Urobilinogen, Ur: 0.2 mg/dL (ref 0.2–1.0)
pH, UA: 6.5 (ref 5.0–7.5)

## 2017-04-15 LAB — MICROSCOPIC EXAMINATION: RBC, UA: >30 /hpf — AB (ref 0–?)

## 2017-04-15 MED ORDER — ESCITALOPRAM OXALATE 10 MG PO TABS: ORAL_TABLET | ORAL | 1 refills | Status: DC

## 2017-04-15 MED ORDER — TRAMADOL HCL 50 MG PO TABS: 50.0000 mg | ORAL_TABLET | Freq: Two times a day (BID) | ORAL | 2 refills | Status: DC | PRN

## 2017-04-15 MED ORDER — ESTROGENS, CONJUGATED 0.625 MG/GM VA CREA: TOPICAL_CREAM | VAGINAL | 11 refills | Status: DC

## 2017-04-15 NOTE — Patient Instructions (Addendum)
Atrophic Vaginitis Atrophic vaginitis is a condition in which the tissues that line the vagina become dry and thin. This condition is most common in women who have stopped having regular menstrual periods (menopause). This usually starts when a woman is 45-52 years old. Estrogen helps to keep the vagina moist. It stimulates the vagina to produce a clear fluid that lubricates the vagina for sexual intercourse. This fluid also protects the vagina from infection. Lack of estrogen can cause the lining of the vagina to get thinner and dryer. The vagina may also shrink in size. It may become less elastic. Atrophic vaginitis tends to get worse over time as a woman's estrogen level drops. What are the causes? This condition is caused by the normal drop in estrogen that happens around the time of menopause. What increases the risk? Certain conditions or situations may lower a woman's estrogen level, which increases her risk of atrophic vaginitis. These include:  Taking medicine that blocks estrogen.  Having ovaries removed surgically.  Being treated for cancer with X-ray treatment (radiation) or medicines (chemotherapy).  Exercising very hard and often.  Having an eating disorder (anorexia).  Giving birth or breastfeeding.  Being over the age of 50.  Smoking.  What are the signs or symptoms? Symptoms of this condition include:  Pain, soreness, or bleeding during sexual intercourse (dyspareunia).  Vaginal burning, irritation, or itching.  Pain or bleeding during a vaginal examination using a speculum (pelvic exam).  Loss of interest in sexual activity.  Having burning pain when passing urine.  Vaginal discharge that is brown or yellow.  In some cases, there are no symptoms. How is this diagnosed? This condition is diagnosed with a medical history and physical exam. This will include a pelvic exam that checks whether the inside of your vagina appears pale, thin, or dry. Rarely, you may  also have other tests, including:  A urine test.  A test that checks the acid balance in your vaginal fluid (acid balance test).  How is this treated? Treatment for this condition may depend on the severity of your symptoms. Treatment may include:  Using an over-the-counter vaginal lubricant before you have sexual intercourse.  Using a long-acting vaginal moisturizer.  Using low-dose vaginal estrogen for moderate to severe symptoms that do not respond to other treatments. Options include creams, tablets, and inserts (vaginal rings). Before using vaginal estrogen, tell your health care provider if you have a history of: ? Breast cancer. ? Endometrial cancer. ? Blood clots.  Taking medicines. You may be able to take a daily pill for dyspareunia. Discuss all of the risks of this medicine with your health care provider. It is usually not recommended for women who have a family history or personal history of breast cancer.  If your symptoms are very mild and you are not sexually active, you may not need treatment. Follow these instructions at home:  Take medicines only as directed by your health care provider. Do not use herbal or alternative medicines unless your health care provider says that you can.  Use over-the-counter creams, lubricants, or moisturizers for dryness only as directed by your health care provider.  If your atrophic vaginitis is caused by menopause, discuss all of your menopausal symptoms and treatment options with your health care provider.  Do not douche.  Do not use products that can make your vagina dry. These include: ? Scented feminine sprays. ? Scented tampons. ? Scented soaps.  If it hurts to have sex, talk with your sexual   partner. Contact a health care provider if:  Your discharge looks different than normal.  Your vagina has an unusual smell.  You have new symptoms.  Your symptoms do not improve with treatment.  Your symptoms get worse. This  information is not intended to replace advice given to you by your health care provider. Make sure you discuss any questions you have with your health care provider. Document Released: 09/03/2014 Document Revised: 09/25/2015 Document Reviewed: 04/10/2014 Elsevier Interactive Patient Education  2018 [employer]  

## 2017-04-15 NOTE — Progress Notes (Signed)
Subjective:    Patient ID: Rita Barry, female    DOB: Sep 17, 1965, 51 y.o.   MRN: [id number]  PT presents to the office today for CPE or pap. PT takes Ultram BID for Fibromyalgia and states this seems to help a great deal. Hypertension  This is a chronic problem. The current episode started more than 1 year ago. The problem has been resolved since onset. The problem is controlled. Associated symptoms include anxiety. Pertinent negatives include no chest pain, malaise/fatigue, peripheral edema or shortness of breath. Risk factors for coronary artery disease include dyslipidemia and sedentary lifestyle. The current treatment provides moderate improvement. There is no history of kidney disease, CAD/MI, CVA or heart failure.  Gastroesophageal Reflux  She reports no chest pain, no coughing or no heartburn. This is a chronic problem. The current episode started more than 1 year ago. The problem occurs occasionally. The symptoms are aggravated by certain foods. She has tried an antacid for the symptoms. The treatment provided mild relief.  Depression         This is a chronic problem.  The current episode started more than 1 year ago.   The onset quality is gradual.   The problem occurs intermittently.  The problem has been resolved since onset.  Associated symptoms include no decreased concentration, no helplessness, no hopelessness, not irritable, no restlessness and not sad.  Past treatments include SSRIs - Selective serotonin reuptake inhibitors.  Compliance with treatment is good.  Past medical history includes anxiety.   Anxiety  Presents for follow-up visit. Symptoms include excessive worry and nervous/anxious behavior. Patient reports no chest pain, decreased concentration, restlessness or shortness of breath. Symptoms occur occasionally. The severity of symptoms is moderate.    Hyperlipidemia  Pertinent negatives include no chest pain or shortness of breath.      Review of Systems    Constitutional: Negative for malaise/fatigue.  Respiratory: Negative for cough and shortness of breath.   Cardiovascular: Negative for chest pain.  Gastrointestinal: Negative for heartburn.  Psychiatric/Behavioral: Positive for depression. Negative for decreased concentration. The patient is nervous/anxious.   All other systems reviewed and are negative.      Objective:   Physical Exam  Constitutional: She is oriented to person, place, and time. She appears well-developed and well-nourished. She is not irritable. No distress.  HENT:  Head: Normocephalic and atraumatic.  Right Ear: External ear normal.  Left Ear: External ear normal.  Nose: Nose normal.  Mouth/Throat: Oropharynx is clear and moist.  Eyes: Pupils are equal, round, and reactive to light.  Neck: Normal range of motion. Neck supple. No thyromegaly present.  Cardiovascular: Normal rate, regular rhythm, normal heart sounds and intact distal pulses.  No murmur heard. Pulmonary/Chest: Effort normal and breath sounds normal. No respiratory distress. She has no wheezes. Right breast exhibits no inverted nipple, no mass, no nipple discharge, no skin change and no tenderness. Left breast exhibits no inverted nipple, no mass, no nipple discharge, no skin change and no tenderness. Breasts are symmetrical.  Abdominal: Soft. Bowel sounds are normal. She exhibits no distension. There is no tenderness.  Genitourinary: Vagina normal.  Genitourinary Comments: Bimanual exam- no adnexal masses, ovaries nonpalpable   Tenderness present, atrophic tissue  Cervix parous and pink- No discharge   Musculoskeletal: Normal range of motion. She exhibits no edema or tenderness.  Neurological: She is alert and oriented to person, place, and time.  Skin: Skin is warm and dry.  Psychiatric: She has a normal mood and  affect. Her behavior is normal. Judgment and thought content normal.  Vitals reviewed.    BP 139/87   Pulse 77   Temp 98.2 F  (36.8 C) (Oral)   Ht 5' 7.5" (1.715 m)   Wt 173 lb 9.6 oz (78.7 kg)   BMI 26.79 kg/m      Assessment & Plan:  1. Gynecologic exam normal - Urinalysis, Complete - CMP14+EGFR - Pap IG w/ reflex to HPV when ASC-U  2. Essential hypertension - CMP14+EGFR  3. GAD (generalized anxiety disorder) PT wishes to stop lexapro Will tamper off Stress management discussed  - CMP14+EGFR - escitalopram (LEXAPRO) 10 MG tablet; Take 20 mg for one week, then 1 tab for 1 week then 1/2 for one week  Dispense: 30 tablet; Refill: 1  4. Moderate episode of recurrent major depressive disorder (HCC) PT wishes to stop lexapro Will tamper off Stress management discussed  - CMP14+EGFR - escitalopram (LEXAPRO) 10 MG tablet; Take 20 mg for one week, then 1 tab for 1 week then 1/2 for one week  Dispense: 30 tablet; Refill: 1  5. Fibromyalgia - CMP14+EGFR - traMADol (ULTRAM) 50 MG tablet; Take 1 tablet (50 mg total) by mouth every 12 (twelve) hours as needed. Takes 29m every morning. Takes 529min the evening if needed for pain.  Dispense: 60 tablet; Refill: 2  6. Hyperlipidemia, unspecified hyperlipidemia type - CMP14+EGFR - Lipid panel  7. Uncomplicated opioid dependence (HCC) - CMP14+EGFR - traMADol (ULTRAM) 50 MG tablet; Take 1 tablet (50 mg total) by mouth every 12 (twelve) hours as needed. Takes 5079mvery morning. Takes 58m58m the evening if needed for pain.  Dispense: 60 tablet; Refill: 2  8. Pain medication agreement signed - CMP14+EGFR - traMADol (ULTRAM) 50 MG tablet; Take 1 tablet (50 mg total) by mouth every 12 (twelve) hours as needed. Takes 58mg48mry morning. Takes 58mg 103mhe evening if needed for pain.  Dispense: 60 tablet; Refill: 2  9. Annual physical exam - Anemia Profile B - CMP14+EGFR - Lipid panel - TSH - VITAMIN D 25 Hydroxy (Vit-D Deficiency, Fractures) - Pap IG w/ reflex to HPV when ASC-U  10. Vaginitis, atrophic - conjugated estrogens (PREMARIN) vaginal cream; Use  three times a week  Dispense: 60 g; Refill: 11   Continue all meds Labs pending Health Maintenance reviewed Diet and exercise encouraged RTO 3 months   ChristEvelina Dun

## 2017-04-16 LAB — CMP14+EGFR
ALT: 18 IU/L (ref 0–32)
AST: 24 IU/L (ref 0–40)
Albumin/Globulin Ratio: 1.8 (ref 1.2–2.2)
Albumin: 4.4 g/dL (ref 3.5–5.5)
Alkaline Phosphatase: 25 IU/L — ABNORMAL LOW (ref 39–117)
BUN/Creatinine Ratio: 20 (ref 9–23)
BUN: 16 mg/dL (ref 6–24)
Bilirubin Total: 0.7 mg/dL (ref 0.0–1.2)
CO2: 25 mmol/L (ref 20–29)
Calcium: 9.8 mg/dL (ref 8.7–10.2)
Chloride: 100 mmol/L (ref 96–106)
Creatinine, Ser: 0.8 mg/dL (ref 0.57–1.00)
GFR calc Af Amer: 98 mL/min/{1.73_m2} (ref >59)
GFR calc non Af Amer: 85 mL/min/{1.73_m2} (ref >59)
Globulin, Total: 2.5 g/dL (ref 1.5–4.5)
Glucose: 90 mg/dL (ref 65–99)
Potassium: 4.7 mmol/L (ref 3.5–5.2)
Sodium: 139 mmol/L (ref 134–144)
Total Protein: 6.9 g/dL (ref 6.0–8.5)

## 2017-04-16 LAB — ANEMIA PROFILE B
Basophils Absolute: 0.1 10*3/uL (ref 0.0–0.2)
Basos: 1 %
EOS (ABSOLUTE): 0.6 10*3/uL — ABNORMAL HIGH (ref 0.0–0.4)
Eos: 9 %
Ferritin: 97 ng/mL (ref 15–150)
Folate: 18.2 ng/mL (ref >3.0)
Hematocrit: 36.7 % (ref 34.0–46.6)
Hemoglobin: 12.2 g/dL (ref 11.1–15.9)
Immature Grans (Abs): 0 10*3/uL (ref 0.0–0.1)
Immature Granulocytes: 0 %
Iron Saturation: 48 % (ref 15–55)
Iron: 171 ug/dL — ABNORMAL HIGH (ref 27–159)
Lymphocytes Absolute: 1.4 10*3/uL (ref 0.7–3.1)
Lymphs: 23 %
MCH: 32.9 pg (ref 26.6–33.0)
MCHC: 33.2 g/dL (ref 31.5–35.7)
MCV: 99 fL — ABNORMAL HIGH (ref 79–97)
Monocytes Absolute: 0.4 10*3/uL (ref 0.1–0.9)
Monocytes: 7 %
Neutrophils Absolute: 3.8 10*3/uL (ref 1.4–7.0)
Neutrophils: 60 %
Platelets: 330 10*3/uL (ref 150–379)
RBC: 3.71 x10E6/uL — ABNORMAL LOW (ref 3.77–5.28)
RDW: 13.2 % (ref 12.3–15.4)
Retic Ct Pct: 2 % (ref 0.6–2.6)
Total Iron Binding Capacity: 354 ug/dL (ref 250–450)
UIBC: 183 ug/dL (ref 131–425)
Vitamin B-12: 667 pg/mL (ref 232–1245)
WBC: 6.3 10*3/uL (ref 3.4–10.8)

## 2017-04-16 LAB — PAP IG W/ RFLX HPV ASCU: PAP Smear Comment: 0

## 2017-04-16 LAB — LIPID PANEL
Chol/HDL Ratio: 3 ratio (ref 0.0–4.4)
Cholesterol, Total: 248 mg/dL — ABNORMAL HIGH (ref 100–199)
HDL: 84 mg/dL (ref >39)
LDL Calculated: 139 mg/dL — ABNORMAL HIGH (ref 0–99)
Triglycerides: 125 mg/dL (ref 0–149)
VLDL Cholesterol Cal: 25 mg/dL (ref 5–40)

## 2017-04-16 LAB — TSH: TSH: 1.25 u[IU]/mL (ref 0.450–4.500)

## 2017-04-16 LAB — VITAMIN D 25 HYDROXY (VIT D DEFICIENCY, FRACTURES): Vit D, 25-Hydroxy: 35.1 ng/mL (ref 30.0–100.0)

## 2017-04-19 ENCOUNTER — Other Ambulatory Visit: Payer: Self-pay | Admitting: Family

## 2017-05-05 ENCOUNTER — Other Ambulatory Visit: Payer: Self-pay | Admitting: Family

## 2017-05-05 DIAGNOSIS — I10 Essential (primary) hypertension: Secondary | ICD-10-CM

## 2017-05-10 ENCOUNTER — Other Ambulatory Visit: Payer: Self-pay | Admitting: *Deleted

## 2017-05-10 DIAGNOSIS — F411 Generalized anxiety disorder: Secondary | ICD-10-CM

## 2017-05-10 DIAGNOSIS — F331 Major depressive disorder, recurrent, moderate: Secondary | ICD-10-CM

## 2017-06-01 ENCOUNTER — Other Ambulatory Visit: Payer: Self-pay | Admitting: Family

## 2017-06-01 DIAGNOSIS — F411 Generalized anxiety disorder: Secondary | ICD-10-CM

## 2017-06-01 DIAGNOSIS — Z1231 Encounter for screening mammogram for malignant neoplasm of breast: Secondary | ICD-10-CM | POA: Diagnosis not present

## 2017-06-01 DIAGNOSIS — F331 Major depressive disorder, recurrent, moderate: Secondary | ICD-10-CM

## 2017-06-02 ENCOUNTER — Other Ambulatory Visit: Payer: Self-pay | Admitting: Family

## 2017-06-02 NOTE — Telephone Encounter (Signed)
Pt aware refill sent to pharmacy 

## 2017-06-17 ENCOUNTER — Telehealth: Payer: Self-pay | Admitting: Family

## 2017-06-17 NOTE — Telephone Encounter (Signed)
Returned patient's phone call.  Patient states that the last rx or lexapro she received states is a tapering dose, but patient states otherwise.  Is patient suppose to be taking full dose or tapering off of lexapro?

## 2017-06-20 MED ORDER — ESCITALOPRAM OXALATE 20 MG PO TABS: 20.0000 mg | ORAL_TABLET | Freq: Every day | ORAL | 5 refills | Status: DC

## 2017-06-20 NOTE — Telephone Encounter (Signed)
Patient wants to stay on the lexapro- rx sent to the pharmacy.

## 2017-06-20 NOTE — Addendum Note (Signed)
Addended by: Angela AdamOSTOSKY, Lisaann Atha C on: 06/20/2017 09:28 AM   Modules accepted: Orders

## 2017-06-20 NOTE — Telephone Encounter (Signed)
Per my office notes from 04/15/17 pt wished to stop Lexapro. We had discussed tampering dose and then stopping. I am fine with her continuing her current dose and can order Lexapro 20 mg if she wishes.

## 2017-06-23 DIAGNOSIS — K080 Exfoliation of teeth due to systemic causes: Secondary | ICD-10-CM | POA: Diagnosis not present

## 2017-06-30 ENCOUNTER — Other Ambulatory Visit: Payer: Self-pay | Admitting: Pediatrics

## 2017-06-30 DIAGNOSIS — T63451A Toxic effect of venom of hornets, accidental (unintentional), initial encounter: Secondary | ICD-10-CM

## 2017-07-14 ENCOUNTER — Ambulatory Visit: Payer: Federal, State, Local not specified - PPO | Admitting: Family

## 2017-07-14 ENCOUNTER — Encounter: Payer: Self-pay | Admitting: Family

## 2017-07-14 VITALS — BP 135/85 | HR 89 | Temp 98.6°F | Ht 67.5 in | Wt 178.6 lb

## 2017-07-14 DIAGNOSIS — F331 Major depressive disorder, recurrent, moderate: Secondary | ICD-10-CM | POA: Diagnosis not present

## 2017-07-14 DIAGNOSIS — K2960 Other gastritis without bleeding: Secondary | ICD-10-CM

## 2017-07-14 DIAGNOSIS — M797 Fibromyalgia: Secondary | ICD-10-CM

## 2017-07-14 DIAGNOSIS — F411 Generalized anxiety disorder: Secondary | ICD-10-CM

## 2017-07-14 DIAGNOSIS — I10 Essential (primary) hypertension: Secondary | ICD-10-CM

## 2017-07-14 DIAGNOSIS — F1120 Opioid dependence, uncomplicated: Secondary | ICD-10-CM

## 2017-07-14 DIAGNOSIS — Z0289 Encounter for other administrative examinations: Secondary | ICD-10-CM | POA: Diagnosis not present

## 2017-07-14 MED ORDER — TRAMADOL HCL 50 MG PO TABS: 50.0000 mg | ORAL_TABLET | Freq: Two times a day (BID) | ORAL | 2 refills | Status: DC | PRN

## 2017-07-14 NOTE — Patient Instructions (Signed)
DASH Eating Plan DASH stands for "Dietary Approaches to Stop Hypertension." The DASH eating plan is a healthy eating plan that has been shown to reduce high blood pressure (hypertension). It may also reduce your risk for type 2 diabetes, heart disease, and stroke. The DASH eating plan may also help with weight loss. What are tips for following this plan? General guidelines  Avoid eating more than 2,300 mg (milligrams) of salt (sodium) a day. If you have hypertension, you may need to reduce your sodium intake to 1,500 mg a day.  Limit alcohol intake to no more than 1 drink a day for nonpregnant women and 2 drinks a day for men. One drink equals 12 oz of beer, 5 oz of wine, or 1 oz of hard liquor.  Work with your health care provider to maintain a healthy body weight or to lose weight. Ask what an ideal weight is for you.  Get at least 30 minutes of exercise that causes your heart to beat faster (aerobic exercise) most days of the week. Activities may include walking, swimming, or biking.  Work with your health care provider or diet and nutrition specialist (dietitian) to adjust your eating plan to your individual calorie needs. Reading food labels  Check food labels for the amount of sodium per serving. Choose foods with less than 5 percent of the Daily Value of sodium. Generally, foods with less than 300 mg of sodium per serving fit into this eating plan.  To find whole grains, look for the word "whole" as the first word in the ingredient list. Shopping  Buy products labeled as "low-sodium" or "no salt added."  Buy fresh foods. Avoid canned foods and premade or frozen meals. Cooking  Avoid adding salt when cooking. Use salt-free seasonings or herbs instead of table salt or sea salt. Check with your health care provider or pharmacist before using salt substitutes.  Do not fry foods. Cook foods using healthy methods such as baking, boiling, grilling, and broiling instead.  Cook with  heart-healthy oils, such as olive, canola, soybean, or sunflower oil. Meal planning   Eat a balanced diet that includes: ? 5 or more servings of fruits and vegetables each day. At each meal, try to fill half of your plate with fruits and vegetables. ? Up to 6-8 servings of whole grains each day. ? Less than 6 oz of lean meat, poultry, or fish each day. A 3-oz serving of meat is about the same size as a deck of cards. One egg equals 1 oz. ? 2 servings of low-fat dairy each day. ? A serving of nuts, seeds, or beans 5 times each week. ? Heart-healthy fats. Healthy fats called Omega-3 fatty acids are found in foods such as flaxseeds and coldwater fish, like sardines, salmon, and mackerel.  Limit how much you eat of the following: ? Canned or prepackaged foods. ? Food that is high in trans fat, such as fried foods. ? Food that is high in saturated fat, such as fatty meat. ? Sweets, desserts, sugary drinks, and other foods with added sugar. ? Full-fat dairy products.  Do not salt foods before eating.  Try to eat at least 2 vegetarian meals each week.  Eat more home-cooked food and less restaurant, buffet, and fast food.  When eating at a restaurant, ask that your food be prepared with less salt or no salt, if possible. What foods are recommended? The items listed may not be a complete list. Talk with your dietitian about what   dietary choices are best for you. Grains Whole-grain or whole-wheat bread. Whole-grain or whole-wheat pasta. Brown rice. Oatmeal. Quinoa. Bulgur. Whole-grain and low-sodium cereals. Pita bread. Low-fat, low-sodium crackers. Whole-wheat flour tortillas. Vegetables Fresh or frozen vegetables (raw, steamed, roasted, or grilled). Low-sodium or reduced-sodium tomato and vegetable juice. Low-sodium or reduced-sodium tomato sauce and tomato paste. Low-sodium or reduced-sodium canned vegetables. Fruits All fresh, dried, or frozen fruit. Canned fruit in natural juice (without  added sugar). Meat and other protein foods Skinless chicken or turkey. Ground chicken or turkey. Pork with fat trimmed off. Fish and seafood. Egg whites. Dried beans, peas, or lentils. Unsalted nuts, nut butters, and seeds. Unsalted canned beans. Lean cuts of beef with fat trimmed off. Low-sodium, lean deli meat. Dairy Low-fat (1%) or fat-free (skim) milk. Fat-free, low-fat, or reduced-fat cheeses. Nonfat, low-sodium ricotta or cottage cheese. Low-fat or nonfat yogurt. Low-fat, low-sodium cheese. Fats and oils Soft margarine without trans fats. Vegetable oil. Low-fat, reduced-fat, or light mayonnaise and salad dressings (reduced-sodium). Canola, safflower, olive, soybean, and sunflower oils. Avocado. Seasoning and other foods Herbs. Spices. Seasoning mixes without salt. Unsalted popcorn and pretzels. Fat-free sweets. What foods are not recommended? The items listed may not be a complete list. Talk with your dietitian about what dietary choices are best for you. Grains Baked goods made with fat, such as croissants, muffins, or some breads. Dry pasta or rice meal packs. Vegetables Creamed or fried vegetables. Vegetables in a cheese sauce. Regular canned vegetables (not low-sodium or reduced-sodium). Regular canned tomato sauce and paste (not low-sodium or reduced-sodium). Regular tomato and vegetable juice (not low-sodium or reduced-sodium). Pickles. Olives. Fruits Canned fruit in a light or heavy syrup. Fried fruit. Fruit in cream or butter sauce. Meat and other protein foods Fatty cuts of meat. Ribs. Fried meat. Bacon. Sausage. Bologna and other processed lunch meats. Salami. Fatback. Hotdogs. Bratwurst. Salted nuts and seeds. Canned beans with added salt. Canned or smoked fish. Whole eggs or egg yolks. Chicken or turkey with skin. Dairy Whole or 2% milk, cream, and half-and-half. Whole or full-fat cream cheese. Whole-fat or sweetened yogurt. Full-fat cheese. Nondairy creamers. Whipped toppings.  Processed cheese and cheese spreads. Fats and oils Butter. Stick margarine. Lard. Shortening. Ghee. Bacon fat. Tropical oils, such as coconut, palm kernel, or palm oil. Seasoning and other foods Salted popcorn and pretzels. Onion salt, garlic salt, seasoned salt, table salt, and sea salt. Worcestershire sauce. Tartar sauce. Barbecue sauce. Teriyaki sauce. Soy sauce, including reduced-sodium. Steak sauce. Canned and packaged gravies. Fish sauce. Oyster sauce. Cocktail sauce. Horseradish that you find on the shelf. Ketchup. Mustard. Meat flavorings and tenderizers. Bouillon cubes. Hot sauce and Tabasco sauce. Premade or packaged marinades. Premade or packaged taco seasonings. Relishes. Regular salad dressings. Where to find more information:  National Heart, Lung, and Blood Institute: www.nhlbi.nih.gov  American Heart Association: www.heart.org Summary  The DASH eating plan is a healthy eating plan that has been shown to reduce high blood pressure (hypertension). It may also reduce your risk for type 2 diabetes, heart disease, and stroke.  With the DASH eating plan, you should limit salt (sodium) intake to 2,300 mg a day. If you have hypertension, you may need to reduce your sodium intake to 1,500 mg a day.  When on the DASH eating plan, aim to eat more fresh fruits and vegetables, whole grains, lean proteins, low-fat dairy, and heart-healthy fats.  Work with your health care provider or diet and nutrition specialist (dietitian) to adjust your eating plan to your individual   calorie needs. This information is not intended to replace advice given to you by your health care provider. Make sure you discuss any questions you have with your health care provider. Document Released: 04/08/2011 Document Revised: 04/12/2016 Document Reviewed: 04/12/2016 Elsevier Interactive Patient Education  2018 Elsevier Inc.  

## 2017-07-14 NOTE — Progress Notes (Signed)
Subjective:    Patient ID: Rita Barry, female    DOB: Jul 17, 1965, 52 y.o.   MRN: [id number]  PT presents to the office today for chronic follow up.  Hypertension  This is a chronic problem. The current episode started more than 1 year ago. The problem has been resolved since onset. The problem is controlled. Associated symptoms include anxiety. Pertinent negatives include no malaise/fatigue, peripheral edema or shortness of breath. Risk factors for coronary artery disease include dyslipidemia. There is no history of kidney disease, CAD/MI or CVA.  Gastroesophageal Reflux  She reports no belching, no coughing or no heartburn. This is a chronic problem. The problem occurs occasionally. The problem has been waxing and waning. The symptoms are aggravated by certain foods. She has tried a diet change for the symptoms. The treatment provided moderate relief.  Hyperlipidemia  This is a chronic problem. The current episode started more than 1 year ago. The problem is uncontrolled. Recent lipid tests were reviewed and are high. Pertinent negatives include no shortness of breath. Current antihyperlipidemic treatment includes diet change. The current treatment provides no improvement of lipids. Risk factors for coronary artery disease include dyslipidemia, obesity and family history.  Depression         This is a chronic problem.  The current episode started more than 1 year ago.   The onset quality is gradual.   The problem occurs intermittently.  Associated symptoms include irritable.  Associated symptoms include no helplessness, no hopelessness and not sad.  Past treatments include SSRIs - Selective serotonin reuptake inhibitors.  Past medical history includes anxiety.   Anxiety  Presents for follow-up visit. Symptoms include excessive worry, irritability and nervous/anxious behavior. Patient reports no depressed mood or shortness of breath. Symptoms occur occasionally. The severity of symptoms is  moderate. The quality of sleep is good.    Fibromyalgia Pt takes Ultram as needed that help.    Review of Systems  Constitutional: Positive for irritability. Negative for malaise/fatigue.  Respiratory: Negative for cough and shortness of breath.   Gastrointestinal: Negative for heartburn.  Psychiatric/Behavioral: Positive for depression. The patient is nervous/anxious.   All other systems reviewed and are negative.      Objective:   Physical Exam  Constitutional: She is oriented to person, place, and time. She appears well-developed and well-nourished. She is irritable. No distress.  HENT:  Head: Normocephalic and atraumatic.  Right Ear: External ear normal.  Mouth/Throat: Oropharynx is clear and moist.  Eyes: Pupils are equal, round, and reactive to light.  Neck: Normal range of motion. Neck supple. No thyromegaly present.  Cardiovascular: Normal rate, regular rhythm, normal heart sounds and intact distal pulses.  No murmur heard. Pulmonary/Chest: Effort normal and breath sounds normal. No respiratory distress. She has no wheezes.  Abdominal: Soft. Bowel sounds are normal. She exhibits no distension. There is no tenderness.  Musculoskeletal: Normal range of motion. She exhibits no edema or tenderness.  Neurological: She is alert and oriented to person, place, and time. She has normal reflexes. No cranial nerve deficit.  Skin: Skin is warm and dry.  Psychiatric: She has a normal mood and affect. Her behavior is normal. Judgment and thought content normal.  Vitals reviewed.     BP 135/85   Pulse 89   Temp 98.6 F (37 C) (Oral)   Ht 5' 7.5" (1.715 m)   Wt 178 lb 9.6 oz (81 kg)   BMI 27.56 kg/m      Assessment & Plan:  1. Fibromyalgia - traMADol (ULTRAM) 50 MG tablet; Take 1 tablet (50 mg total) by mouth every 12 (twelve) hours as needed.  Dispense: 60 tablet; Refill: 2  2. Pain medication agreement signed - traMADol (ULTRAM) 50 MG tablet; Take 1 tablet (50 mg total)  by mouth every 12 (twelve) hours as needed.  Dispense: 60 tablet; Refill: 2  3. Uncomplicated opioid dependence (HCC) - traMADol (ULTRAM) 50 MG tablet; Take 1 tablet (50 mg total) by mouth every 12 (twelve) hours as needed.  Dispense: 60 tablet; Refill: 2  4. Essential hypertension  5. GAD (generalized anxiety disorder)  6. Moderate episode of recurrent major depressive disorder (HCC  7. Erosive gastritis    Continue all meds Will hold labs off today and draw on next visit Health Maintenance reviewed Diet and exercise encouraged RTO 3 months   [name], FNP

## 2017-07-15 ENCOUNTER — Other Ambulatory Visit: Payer: Self-pay | Admitting: *Deleted

## 2017-07-15 MED ORDER — ESCITALOPRAM OXALATE 20 MG PO TABS: 20.0000 mg | ORAL_TABLET | Freq: Every day | ORAL | 1 refills | Status: DC

## 2017-07-18 ENCOUNTER — Telehealth: Payer: Self-pay | Admitting: Family Medicine

## 2017-07-18 NOTE — Telephone Encounter (Signed)
This is addressed in another encounter in the patients chart. Please see Rita Barry.

## 2017-07-19 NOTE — Telephone Encounter (Signed)
See Rita SoxPatricia Barry's chart regarding this.

## 2017-10-13 DIAGNOSIS — K080 Exfoliation of teeth due to systemic causes: Secondary | ICD-10-CM | POA: Diagnosis not present

## 2017-10-14 ENCOUNTER — Ambulatory Visit: Payer: Federal, State, Local not specified - PPO | Admitting: Family

## 2017-10-17 ENCOUNTER — Ambulatory Visit: Payer: Federal, State, Local not specified - PPO | Admitting: Family

## 2017-10-17 ENCOUNTER — Encounter: Payer: Self-pay | Admitting: Family

## 2017-10-17 VITALS — BP 131/82 | HR 80 | Temp 97.9°F | Ht 67.5 in | Wt 174.6 lb

## 2017-10-17 DIAGNOSIS — F411 Generalized anxiety disorder: Secondary | ICD-10-CM

## 2017-10-17 DIAGNOSIS — I10 Essential (primary) hypertension: Secondary | ICD-10-CM

## 2017-10-17 DIAGNOSIS — M797 Fibromyalgia: Secondary | ICD-10-CM | POA: Diagnosis not present

## 2017-10-17 DIAGNOSIS — F1120 Opioid dependence, uncomplicated: Secondary | ICD-10-CM | POA: Diagnosis not present

## 2017-10-17 DIAGNOSIS — E785 Hyperlipidemia, unspecified: Secondary | ICD-10-CM

## 2017-10-17 DIAGNOSIS — Z0289 Encounter for other administrative examinations: Secondary | ICD-10-CM | POA: Diagnosis not present

## 2017-10-17 DIAGNOSIS — F331 Major depressive disorder, recurrent, moderate: Secondary | ICD-10-CM

## 2017-10-17 MED ORDER — TRAMADOL HCL 50 MG PO TABS: 50.0000 mg | ORAL_TABLET | Freq: Two times a day (BID) | ORAL | 2 refills | Status: DC | PRN

## 2017-10-17 NOTE — Progress Notes (Signed)
Subjective:    Patient ID: Rita Barry, female    DOB: [birth year], 52 y.o.   MRN: [id number]  Chief Complaint  Patient presents with  . Hypertension    three month recheck   PT presents to the office today for chronic follow up.  Hypertension  This is a chronic problem. The current episode started more than 1 year ago. The problem has been resolved since onset. The problem is controlled. Associated symptoms include anxiety. Pertinent negatives include no headaches, malaise/fatigue or peripheral edema. Risk factors for coronary artery disease include dyslipidemia. The current treatment provides moderate improvement. There is no history of kidney disease.  Hyperlipidemia  This is a chronic problem. The current episode started more than 1 year ago. The problem is uncontrolled. Exacerbating diseases include obesity. Current antihyperlipidemic treatment includes diet change. The current treatment provides mild improvement of lipids.  Gastroesophageal Reflux  She reports no belching, no coughing or no heartburn. This is a chronic problem. The current episode started more than 1 month ago. The problem occurs occasionally. The problem has been waxing and waning. The symptoms are aggravated by certain foods. She has tried a PPI for the symptoms. The treatment provided moderate relief.  Anxiety  Presents for follow-up visit. Symptoms include excessive worry and nervous/anxious behavior. Patient reports no decreased concentration, irritability or restlessness. Symptoms occur most days. The severity of symptoms is mild.    Depression         This is a chronic problem.  The current episode started more than 1 year ago.   The onset quality is gradual.   The problem occurs intermittently.  The problem has been waxing and waning since onset.  Associated symptoms include irritable.  Associated symptoms include no decreased concentration, no helplessness, no hopelessness, no restlessness, no headaches and  not sad.  Past treatments include SSRIs - Selective serotonin reuptake inhibitors.  Past medical history includes anxiety.   Fibromyalgia Pt takes Ultram as needed that helps. She is very active with working outside. Generalized Intermittent aching pain 5-6 out 10 "all over".    Review of Systems  Constitutional: Negative for irritability and malaise/fatigue.  Respiratory: Negative for cough.   Gastrointestinal: Negative for heartburn.  Neurological: Negative for headaches.  Psychiatric/Behavioral: Positive for depression. Negative for decreased concentration. The patient is nervous/anxious.   All other systems reviewed and are negative.      Objective:   Physical Exam  Constitutional: She is oriented to person, place, and time. She appears well-developed and well-nourished. She is irritable. No distress.  HENT:  Head: Normocephalic and atraumatic.  Right Ear: External ear normal.  Left Ear: External ear normal.  Mouth/Throat: Oropharynx is clear and moist.  Eyes: Pupils are equal, round, and reactive to light.  Neck: Normal range of motion. Neck supple. No thyromegaly present.  Cardiovascular: Normal rate, regular rhythm, normal heart sounds and intact distal pulses.  No murmur heard. Pulmonary/Chest: Effort normal and breath sounds normal. No respiratory distress. She has no wheezes.  Abdominal: Soft. Bowel sounds are normal. She exhibits no distension. There is no tenderness.  Musculoskeletal: Normal range of motion. She exhibits no edema or tenderness.  Neurological: She is alert and oriented to person, place, and time. She has normal reflexes. No cranial nerve deficit.  Skin: Skin is warm and dry.  Psychiatric: She has a normal mood and affect. Her behavior is normal. Judgment and thought content normal.  Vitals reviewed.    BP 131/82   Pulse 80  Temp 97.9 F (36.6 C) (Oral)   Ht 5' 7.5" (1.715 m)   Wt 174 lb 9.6 oz (79.2 kg)   BMI 26.94 kg/m      Assessment &  Plan:  Rita Barry comes in today with chief complaint of Hypertension (three month recheck)   Diagnosis and orders addressed:  1. GAD (generalized anxiety disorder) - CMP14+EGFR  2. Moderate episode of recurrent major depressive disorder (HCC) - CMP14+EGFR  3. Fibromyalgia - traMADol (ULTRAM) 50 MG tablet; Take 1 tablet (50 mg total) by mouth every 12 (twelve) hours as needed.  Dispense: 60 tablet; Refill: 2 - CMP14+EGFR  4. Hyperlipidemia, unspecified hyperlipidemia type - CMP14+EGFR  5. Pain medication agreement signed - traMADol (ULTRAM) 50 MG tablet; Take 1 tablet (50 mg total) by mouth every 12 (twelve) hours as needed.  Dispense: 60 tablet; Refill: 2 - CMP14+EGFR  6. Uncomplicated opioid dependence (HCC) - traMADol (ULTRAM) 50 MG tablet; Take 1 tablet (50 mg total) by mouth every 12 (twelve) hours as needed.  Dispense: 60 tablet; Refill: 2 - CMP14+EGFR  7. Essential hypertension - CMP14+EGFR   Labs pending Health Maintenance reviewed Diet and exercise encouraged  Follow up plan: 3 months    [name], FNP

## 2017-10-17 NOTE — Patient Instructions (Signed)
Fat and Cholesterol Restricted Diet Getting too much fat and cholesterol in your diet may cause health problems. Following this diet helps keep your fat and cholesterol at normal levels. This can keep you from getting sick. What types of fat should I choose?  Choose monosaturated and polyunsaturated fats. These are found in foods such as olive oil, canola oil, flaxseeds, walnuts, almonds, and seeds.  Eat more omega-3 fats. Good choices include salmon, mackerel, sardines, tuna, flaxseed oil, and ground flaxseeds.  Limit saturated fats. These are in animal products such as meats, butter, and cream. They can also be in plant products such as palm oil, palm kernel oil, and coconut oil.  Avoid foods with partially hydrogenated oils in them. These contain trans fats. Examples of foods that have trans fats are stick margarine, some tub margarines, cookies, crackers, and other baked goods. What general guidelines do I need to follow?  Check food labels. Look for the words "trans fat" and "saturated fat."  When preparing a meal: ? Fill half of your plate with vegetables and green salads. ? Fill one fourth of your plate with whole grains. Look for the word "whole" as the first word in the ingredient list. ? Fill one fourth of your plate with lean protein foods.  Eat more foods that have fiber, like apples, carrots, beans, peas, and barley.  Eat more home-cooked foods. Eat less at restaurants and buffets.  Limit or avoid alcohol.  Limit foods high in starch and sugar.  Limit fried foods.  Cook foods without frying them. Baking, boiling, grilling, and broiling are all great options.  Lose weight if you are overweight. Losing even a small amount of weight can help your overall health. It can also help prevent diseases such as diabetes and heart disease. What foods can I eat? Grains Whole grains, such as whole wheat or whole grain breads, crackers, cereals, and pasta. Unsweetened oatmeal,  bulgur, barley, quinoa, or brown rice. Corn or whole wheat flour tortillas. Vegetables Fresh or frozen vegetables (raw, steamed, roasted, or grilled). Green salads. Fruits All fresh, canned (in natural juice), or frozen fruits. Meat and Other Protein Products Ground beef (85% or leaner), grass-fed beef, or beef trimmed of fat. Skinless chicken or turkey. Ground chicken or turkey. Pork trimmed of fat. All fish and seafood. Eggs. Dried beans, peas, or lentils. Unsalted nuts or seeds. Unsalted canned or dry beans. Dairy Low-fat dairy products, such as skim or 1% milk, 2% or reduced-fat cheeses, low-fat ricotta or cottage cheese, or plain low-fat yogurt. Fats and Oils Tub margarines without trans fats. Light or reduced-fat mayonnaise and salad dressings. Avocado. Olive, canola, sesame, or safflower oils. Natural peanut or almond butter (choose ones without added sugar and oil). The items listed above may not be a complete list of recommended foods or beverages. Contact your dietitian for more options. What foods are not recommended? Grains White bread. White pasta. White rice. Cornbread. Bagels, pastries, and croissants. Crackers that contain trans fat. Vegetables White potatoes. Corn. Creamed or fried vegetables. Vegetables in a cheese sauce. Fruits Dried fruits. Canned fruit in light or heavy syrup. Fruit juice. Meat and Other Protein Products Fatty cuts of meat. Ribs, chicken wings, bacon, sausage, bologna, salami, chitterlings, fatback, hot dogs, bratwurst, and packaged luncheon meats. Liver and organ meats. Dairy Whole or 2% milk, cream, half-and-half, and cream cheese. Whole milk cheeses. Whole-fat or sweetened yogurt. Full-fat cheeses. Nondairy creamers and whipped toppings. Processed cheese, cheese spreads, or cheese curds. Sweets and Desserts Corn   syrup, sugars, honey, and molasses. Candy. Jam and jelly. Syrup. Sweetened cereals. Cookies, pies, cakes, donuts, muffins, and ice  cream. Fats and Oils Butter, stick margarine, lard, shortening, ghee, or bacon fat. Coconut, palm kernel, or palm oils. Beverages Alcohol. Sweetened drinks (such as sodas, lemonade, and fruit drinks or punches). The items listed above may not be a complete list of foods and beverages to avoid. Contact your dietitian for more information. This information is not intended to replace advice given to you by your health care provider. Make sure you discuss any questions you have with your health care provider. Document Released: 10/19/2011 Document Revised: 12/25/2015 Document Reviewed: 07/19/2013 Elsevier Interactive Patient Education  2018 [employer]  

## 2017-10-18 LAB — CMP14+EGFR
ALT: 42 IU/L — ABNORMAL HIGH (ref 0–32)
AST: 55 IU/L — ABNORMAL HIGH (ref 0–40)
Albumin/Globulin Ratio: 1.6 (ref 1.2–2.2)
Albumin: 4.3 g/dL (ref 3.5–5.5)
Alkaline Phosphatase: 27 IU/L — ABNORMAL LOW (ref 39–117)
BUN/Creatinine Ratio: 18 (ref 9–23)
BUN: 14 mg/dL (ref 6–24)
Bilirubin Total: 0.5 mg/dL (ref 0.0–1.2)
CO2: 23 mmol/L (ref 20–29)
Calcium: 9.2 mg/dL (ref 8.7–10.2)
Chloride: 99 mmol/L (ref 96–106)
Creatinine, Ser: 0.77 mg/dL (ref 0.57–1.00)
GFR calc Af Amer: 103 mL/min/{1.73_m2} (ref >59)
GFR calc non Af Amer: 89 mL/min/{1.73_m2} (ref >59)
Globulin, Total: 2.7 g/dL (ref 1.5–4.5)
Glucose: 90 mg/dL (ref 65–99)
Potassium: 4.5 mmol/L (ref 3.5–5.2)
Sodium: 138 mmol/L (ref 134–144)
Total Protein: 7 g/dL (ref 6.0–8.5)

## 2017-10-22 ENCOUNTER — Other Ambulatory Visit: Payer: Self-pay | Admitting: Family

## 2017-10-22 DIAGNOSIS — I10 Essential (primary) hypertension: Secondary | ICD-10-CM

## 2017-11-22 ENCOUNTER — Encounter: Payer: Self-pay | Admitting: Family Medicine

## 2017-11-22 ENCOUNTER — Ambulatory Visit: Payer: Federal, State, Local not specified - PPO | Admitting: Family Medicine

## 2017-11-22 VITALS — BP 123/73 | HR 82 | Temp 98.0°F | Ht 67.0 in | Wt 174.0 lb

## 2017-11-22 DIAGNOSIS — M79661 Pain in right lower leg: Secondary | ICD-10-CM

## 2017-11-22 NOTE — Progress Notes (Signed)
Subjective: CC: calf pain PCP: Junie Spencer, FNP RUE:[id number] Rita Barry is a 52 y.o. female presenting to clinic today for:  1. Calf pain Patient reports acute onset of right-sided calf pain 3 days ago.  She notes that the pain seemed to be relieved some by stretch but always returned.  She got a massage in efforts to improve symptoms but pain is been persistent.  She notes that she did have associated swelling of the right calf that is since resolved.  No recent travel, treatment for cancer, history of clotting disorder.  No chest pain or shortness of breath.  She is not a smoker.  She has had issues with sciatica within the left lower extremity and states that she has intermittent left lower extremity numbness over the last couple of months.  This seems to be most prominent when she is getting up in the morning and is relieved by getting out of bed.  No associated lower extremity weakness.  She comes in for evaluation of the right calf because the nurse from her insurance company was concerned for possible clot.  Patient is on OCPs.   ROS: Per HPI  Allergies  Allergen Reactions  . Penicillins Swelling    As a child. Has patient had a PCN reaction causing immediate rash, facial/tongue/throat swelling, SOB or lightheadedness with hypotension: Yes Has patient had a PCN reaction causing severe rash involving mucus membranes or skin necrosis: No Has patient had a PCN reaction that required hospitalization: No Has patient had a PCN reaction occurring within the last 10 years: No If all of the above answers are "NO", then may proceed with Cephalos   Past Medical History:  Diagnosis Date  . Anxiety   . Depression   . 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 Fibromyalgia    Questionable    Current Outpatient Medications:  .  albuterol (PROVENTIL HFA;VENTOLIN HFA) 108 (90 Base) MCG/ACT inhaler, Inhale 2 puffs into the lungs every 6 (six) hours as  needed for wheezing or shortness of breath., Disp: 1 Inhaler, Rfl: 2 .  CAMILA 0.35 MG tablet, TAKE 1 TABLET (0.35 MG TOTAL) BY MOUTH DAILY., Disp: 84 tablet, Rfl: 1 .  clobetasol cream (TEMOVATE) 0.05 %, APPLY 1 APPLICATION TOPICALLY 2 (TWO) TIMES DAILY., Disp: 30 g, Rfl: 2 .  conjugated estrogens (PREMARIN) vaginal cream, Use three times a week, Disp: 60 g, Rfl: 11 .  escitalopram (LEXAPRO) 20 MG tablet, Take 1 tablet (20 mg total) by mouth daily., Disp: 90 tablet, Rfl: 1 .  fluticasone (FLONASE) 50 MCG/ACT nasal spray, PLACE 2 SPRAYS INTO THE NOSE DAILY., Disp: 16 g, Rfl: 2 .  GARLIC PO, Take 1 tablet by mouth daily., Disp: , Rfl:  .  hydrochlorothiazide (MICROZIDE) 12.5 MG capsule, TAKE 1 CAPSULE EVERY DAY, Disp: 90 capsule, Rfl: 1 .  Magnesium 250 MG TABS, Take 250 mg by mouth at bedtime. , Disp: , Rfl:  .  Milk Thistle 250 MG CAPS, Take 250 mg by mouth 2 (two) times daily. , Disp: , Rfl:  .  Multiple Vitamins-Minerals (WOMENS MULTI VITAMIN & MINERAL PO), Take 1 tablet by mouth daily., Disp: , Rfl:  .  Probiotic Product (PHILLIPS COLON HEALTH PO), Take 1 tablet by mouth daily., Disp: , Rfl:  .  traMADol (ULTRAM) 50 MG tablet, Take 1 tablet (50 mg total) by mouth every 12 (twelve) hours as needed., Disp: 60 tablet, Rfl: 2 .  Turmeric 500 MG TABS, Take 500 capsules by mouth 3 (three) times daily. , Disp: , Rfl:  .  Vitamin D, Cholecalciferol, 1000 units CAPS, Take 1,000 Units by mouth daily. , Disp: , Rfl:  Social History   Socioeconomic History  . Marital status: Married    Spouse name: Not on file  . Number of children: Not on file  . Years of education: Not on file  . Highest education level: Not on file  Occupational History  . Occupation: Works with Horses  Social Needs  . Financial resource strain: Not on file  . Food insecurity:    Worry: Not on file    Inability: Not on file  . Transportation needs:    Medical: Not on file    Non-medical: Not on file  Tobacco Use  .  Smoking status: Never Smoker  . Smokeless tobacco: Never Used  Substance and Sexual Activity  . Alcohol use: No    Comment: quit drinking 3 weeks ago  . Drug use: No  . Sexual activity: Yes  Lifestyle  . Physical activity:    Days per week: Not on file    Minutes per session: Not on file  . Stress: Not on file  Relationships  . Social connections:    Talks on phone: Not on file    Gets together: Not on file    Attends religious service: Not on file    Active member of club or organization: Not on file    Attends meetings of clubs or organizations: Not on file    Relationship status: Not on file  . Intimate partner violence:    Fear of current or ex partner: Not on file    Emotionally abused: Not on file    Physically abused: Not on file    Forced sexual activity: Not on file  Other Topics Concern  . Not on file  Social History Narrative  . Not on file   Family History  Problem Relation Age of Onset  . Cancer Father        Throat  . Hypertension Mother   . Cancer Mother     Objective: Office vital signs reviewed. BP 123/73   Pulse 82   Temp 98 F (36.7 C) (Oral)   Ht 5\' 7"  (1.702 m)   Wt 174 lb (78.9 kg)   BMI 27.25 kg/m   Physical Examination:  General: Awake, alert, well nourished, well appearing female. No acute distress HEENT: Normal, MMM, sclera white. Cardio: regular rate and rhythm, S1S2 heard, no murmurs appreciated Pulm: clear to auscultation bilaterally, no wheezes, rhonchi or rales; normal work of breathing on room air Extremities: warm, well perfused, No edema, cyanosis or clubbing; +2 pulses bilaterally MSK: antalgic gait and station  Right LE: No significant swelling, discoloration.  She does have tenderness to palpation along the posterior calf.  Calf girth is 37.5 cm.  Negative Homans.  Left lower extremity: No significant swelling or discoloration.  No tenderness to palpation along the calf.  Calf girth is 36.5 cm.  Negative Homans. Skin: dry;  intact; no rashes or lesions Neuro: light touch sensation in tact.  Assessment/ Plan: 52 y.o. female   1. Right calf pain Wells score DVT 1 (for tenderness to calf).  Differential diagnosis include DVT, calf strain.  Patient is afebrile with normal vital signs.  She is well-appearing.  No known history of clotting disorder.  Possible risk factors include use of OCPs.  She is a non-smoker however.  No recent travel.  No known malignancy or recent treatment for malignancy.  Will obtain d-dimer to further evaluate.  We also discussed consideration for proceeding with ultrasound instead of d-dimer.  Patient wishes to obtain blood test and order ultrasound if needed.  Will check a BMP as well.  Will contact patient with results once available.  Reasons for emergent evaluation emergency department discussed.  Both the patient and her husband voiced good understanding. - D-dimer, quantitative (not at [facility]) - Basic Metabolic Panel   Orders Placed This Encounter  Procedures  . D-dimer, quantitative (not at [facility])  . Basic Metabolic Panel    Raliegh Ip, DO [facility] [phone]

## 2017-11-22 NOTE — Patient Instructions (Signed)
We are checking a DDimer to see if there is evidence of clot formation.  If this is positive, I will recommend an ultrasound of the extremity.  If it is negative, clot is ruled out.  We are also checking BMP for electrolyte and renal function.  These labs should be back in the morning and you will be contacted with results.

## 2017-11-23 ENCOUNTER — Ambulatory Visit (HOSPITAL_COMMUNITY)
Admission: RE | Admit: 2017-11-23 | Discharge: 2017-11-23 | Disposition: A | Discharge status: Home / Self Care | Payer: Federal, State, Local not specified - PPO | Source: Ambulatory Visit | Attending: Family Medicine | Admitting: Family Medicine

## 2017-11-23 ENCOUNTER — Other Ambulatory Visit: Payer: Self-pay | Admitting: Family Medicine

## 2017-11-23 ENCOUNTER — Telehealth: Payer: Self-pay | Admitting: Family Medicine

## 2017-11-23 DIAGNOSIS — I824Z1 Acute embolism and thrombosis of unspecified deep veins of right distal lower extremity: Secondary | ICD-10-CM | POA: Insufficient documentation

## 2017-11-23 DIAGNOSIS — M79661 Pain in right lower leg: Secondary | ICD-10-CM | POA: Diagnosis not present

## 2017-11-23 DIAGNOSIS — R7989 Other specified abnormal findings of blood chemistry: Secondary | ICD-10-CM | POA: Diagnosis not present

## 2017-11-23 DIAGNOSIS — R52 Pain, unspecified: Secondary | ICD-10-CM

## 2017-11-23 DIAGNOSIS — I82491 Acute embolism and thrombosis of other specified deep vein of right lower extremity: Secondary | ICD-10-CM

## 2017-11-23 LAB — BASIC METABOLIC PANEL
BUN/Creatinine Ratio: 21 (ref 9–23)
BUN: 16 mg/dL (ref 6–24)
CO2: 27 mmol/L (ref 20–29)
Calcium: 9.4 mg/dL (ref 8.7–10.2)
Chloride: 97 mmol/L (ref 96–106)
Creatinine, Ser: 0.76 mg/dL (ref 0.57–1.00)
GFR calc Af Amer: 104 mL/min/{1.73_m2} (ref >59)
GFR calc non Af Amer: 90 mL/min/{1.73_m2} (ref >59)
Glucose: 86 mg/dL (ref 65–99)
Potassium: 4.4 mmol/L (ref 3.5–5.2)
Sodium: 139 mmol/L (ref 134–144)

## 2017-11-23 LAB — D-DIMER, QUANTITATIVE: D-DIMER: 1.86 mg/L FEU — ABNORMAL HIGH (ref 0.00–0.49)

## 2017-11-23 MED ORDER — RIVAROXABAN (XARELTO) VTE STARTER PACK (15 & 20 MG): ORAL_TABLET | ORAL | 0 refills | Status: DC

## 2017-11-23 NOTE — Telephone Encounter (Signed)
Patient with positive d-dimer.  Result was 1.86.  I have arranged a stat ultrasound of the right lower extremity at [facility].  I informed patient of need to go to directly to [facility] for imaging study.  She is to remain there until study has been read and results called to this office.  We discussed that she may need to start anticoagulation pending this study.  She voiced good understanding will go directly to the hospital.  Rozell SearingAshly M. Nadine CountsGottschalk, DO [facility]

## 2017-11-24 ENCOUNTER — Other Ambulatory Visit: Payer: Self-pay | Admitting: Family Medicine

## 2017-11-24 ENCOUNTER — Telehealth: Payer: Self-pay | Admitting: *Deleted

## 2017-11-24 DIAGNOSIS — M797 Fibromyalgia: Secondary | ICD-10-CM

## 2017-11-24 DIAGNOSIS — F1120 Opioid dependence, uncomplicated: Secondary | ICD-10-CM

## 2017-11-24 DIAGNOSIS — Z0289 Encounter for other administrative examinations: Secondary | ICD-10-CM

## 2017-11-24 MED ORDER — TRAMADOL HCL 50 MG PO TABS: 50.0000 mg | ORAL_TABLET | Freq: Two times a day (BID) | ORAL | 0 refills | Status: DC | PRN

## 2017-11-24 NOTE — Telephone Encounter (Signed)
DR. Reece AgarG spoke with pt

## 2017-11-24 NOTE — Telephone Encounter (Signed)
Incoming call from pt Pain from blood clot is worsen today Pt can not take Tylenol per Jannifer Rodneyhristy Hawks recommendation  Please advise

## 2017-11-24 NOTE — Progress Notes (Signed)
I spoke to patient with regards to right lower extremity pain in the setting of acute DVT.  She is actually already treated with tramadol 50 mg twice daily as needed pain.  Unfortunately this medication is not helping substantially with current pain related to acute DVT.  I have instructed her to increase to 100 mg p.o. twice daily since she denied excessive sedation or intolerance to tramadol 50 mg.  I have adjusted her prescription and sent it to the pharmacy to place on file.  She says she has about 50 pills of tramadol at home, she just had it filled on 7/17.  I reminded her that she should discontinue use of OCPs, as this may be what has caused the clot.  She does have a family history of clot in her mother per her report today.  Continue to ice and elevate lower extremity.  She has an appointment with hematology on December 01, 2017.  She will follow-up for tramadol refills and surveillance of acute DVT as directed.  The Narcotic Database has been reviewed.  There were no red flags.    Meds ordered this encounter  Medications  . traMADol (ULTRAM) 50 MG tablet    Sig: Take 1-2 tablets (50-100 mg total) by mouth every 12 (twelve) hours as needed for severe pain.    Dispense:  120 tablet    Refill:  0    Put on file. To replace previous Tramadol rx.  Patient just filled 7/17 and has plenty of medication for increased dose for now.     Ashly M. Nadine CountsGottschalk, DO [facility]

## 2017-11-24 NOTE — Telephone Encounter (Signed)
I will send in small qty of Tramadol.  Please have her follow up if refills are needed.  No driving or operating heavy machinery w/ tramadol.  Please make sure she has a follow up visit scheduled in 1-2 weeks.

## 2017-11-26 ENCOUNTER — Other Ambulatory Visit: Payer: Self-pay | Admitting: Family

## 2017-11-29 ENCOUNTER — Telehealth: Payer: Self-pay | Admitting: Family

## 2017-11-29 NOTE — Telephone Encounter (Signed)
Please advise on activity level for patient?

## 2017-11-29 NOTE — Telephone Encounter (Signed)
No limitations. However, pt needs to reduce chance of bleeding. Falls preventions, electric razor, and injury preventions. She needs to follow up with Hemologists to rule out genetic component.

## 2017-11-29 NOTE — Telephone Encounter (Signed)
Aware of provider's advise. 

## 2017-12-01 ENCOUNTER — Inpatient Hospital Stay (HOSPITAL_COMMUNITY): Payer: Federal, State, Local not specified - PPO | Attending: Internal Medicine | Admitting: Internal Medicine

## 2017-12-01 ENCOUNTER — Inpatient Hospital Stay (HOSPITAL_COMMUNITY): Payer: Federal, State, Local not specified - PPO

## 2017-12-01 ENCOUNTER — Encounter (HOSPITAL_COMMUNITY): Payer: Self-pay | Admitting: Internal Medicine

## 2017-12-01 ENCOUNTER — Other Ambulatory Visit: Payer: Self-pay

## 2017-12-01 VITALS — BP 125/78 | HR 78 | Temp 98.5°F | Resp 18 | Wt 175.0 lb

## 2017-12-01 DIAGNOSIS — Z803 Family history of malignant neoplasm of breast: Secondary | ICD-10-CM | POA: Insufficient documentation

## 2017-12-01 DIAGNOSIS — F329 Major depressive disorder, single episode, unspecified: Secondary | ICD-10-CM | POA: Insufficient documentation

## 2017-12-01 DIAGNOSIS — I82491 Acute embolism and thrombosis of other specified deep vein of right lower extremity: Secondary | ICD-10-CM

## 2017-12-01 DIAGNOSIS — I10 Essential (primary) hypertension: Secondary | ICD-10-CM | POA: Diagnosis not present

## 2017-12-01 DIAGNOSIS — Z808 Family history of malignant neoplasm of other organs or systems: Secondary | ICD-10-CM | POA: Diagnosis not present

## 2017-12-01 DIAGNOSIS — Z7901 Long term (current) use of anticoagulants: Secondary | ICD-10-CM | POA: Diagnosis not present

## 2017-12-01 DIAGNOSIS — M797 Fibromyalgia: Secondary | ICD-10-CM | POA: Diagnosis not present

## 2017-12-01 DIAGNOSIS — Z832 Family history of diseases of the blood and blood-forming organs and certain disorders involving the immune mechanism: Secondary | ICD-10-CM

## 2017-12-01 DIAGNOSIS — D6862 Lupus anticoagulant syndrome: Secondary | ICD-10-CM | POA: Insufficient documentation

## 2017-12-01 LAB — CBC WITH DIFFERENTIAL/PLATELET
Basophils Absolute: 0.1 10*3/uL (ref 0.0–0.1)
Basophils Relative: 1 %
Eosinophils Absolute: 0.6 10*3/uL (ref 0.0–0.7)
Eosinophils Relative: 11 %
HCT: 38.9 % (ref 36.0–46.0)
Hemoglobin: 12.7 g/dL (ref 12.0–15.0)
Lymphocytes Relative: 20 %
Lymphs Abs: 1.2 10*3/uL (ref 0.7–4.0)
MCH: 33.5 pg (ref 26.0–34.0)
MCHC: 32.6 g/dL (ref 30.0–36.0)
MCV: 102.6 fL — ABNORMAL HIGH (ref 78.0–100.0)
Monocytes Absolute: 0.4 10*3/uL (ref 0.1–1.0)
Monocytes Relative: 7 %
Neutro Abs: 3.5 10*3/uL (ref 1.7–7.7)
Neutrophils Relative %: 61 %
Platelets: 344 10*3/uL (ref 150–400)
RBC: 3.79 MIL/uL — ABNORMAL LOW (ref 3.87–5.11)
RDW: 12.7 % (ref 11.5–15.5)
WBC: 5.8 10*3/uL (ref 4.0–10.5)

## 2017-12-01 LAB — COMPREHENSIVE METABOLIC PANEL
ALT: 35 U/L (ref 0–44)
AST: 30 U/L (ref 15–41)
Albumin: 4 g/dL (ref 3.5–5.0)
Alkaline Phosphatase: 23 U/L — ABNORMAL LOW (ref 38–126)
Anion gap: 7 (ref 5–15)
BUN: 17 mg/dL (ref 6–20)
CO2: 28 mmol/L (ref 22–32)
Calcium: 9.3 mg/dL (ref 8.9–10.3)
Chloride: 102 mmol/L (ref 98–111)
Creatinine, Ser: 0.68 mg/dL (ref 0.44–1.00)
GFR calc Af Amer: >60 mL/min (ref >60)
GFR calc non Af Amer: >60 mL/min (ref >60)
Glucose, Bld: 98 mg/dL (ref 70–99)
Potassium: 4.1 mmol/L (ref 3.5–5.1)
Sodium: 137 mmol/L (ref 135–145)
Total Bilirubin: 0.5 mg/dL (ref 0.3–1.2)
Total Protein: 7.2 g/dL (ref 6.5–8.1)

## 2017-12-01 LAB — APTT: aPTT: 34 seconds (ref 24–36)

## 2017-12-01 LAB — LACTATE DEHYDROGENASE: LDH: 153 U/L (ref 98–192)

## 2017-12-01 LAB — PROTIME-INR
INR: 1.45
Prothrombin Time: 17.5 seconds — ABNORMAL HIGH (ref 11.4–15.2)

## 2017-12-01 NOTE — Progress Notes (Signed)
Referring physician:  [facility]  Diagnosis Acute deep vein thrombosis (DVT) of other specified vein of right lower extremity ([employer]) - Plan: CT CHEST W CONTRAST, CT Abdomen Pelvis W Contrast, CBC with Differential/Platelet, Comprehensive metabolic panel, Lactate dehydrogenase, APTT, Protime-INR, Factor 5 leiden, Prothrombin gene mutation, Beta-2-glycoprotein i abs, IgG/M/A, Lupus anticoagulant panel  Staging Cancer Staging No matching staging information was found for the patient.  Assessment and Plan: 1.  Right lower extremity DVT.   Patient reported right calf pain and subsequently underwent right lower extremity Doppler done 11/23/2017 that showed evidence of an isolated calf DVT involving the gastrocnemius veins.  She is currently on Xarelto.  She reports pain improved once therapy was started.  She reports she had been on Camila for 1 year which is an oral progesterone contraceptive.  She has now discontinued the medication.  She reports her mother developed a blood clot after surgery and remains on anticoagulation.  Patient denied any history of miscarriages or prior DVT or PE.   She denied any extensive travel or recent surgery.  She is seen today for consultation due to recent diagnosis of right lower extremity DVT.  I discussed with the patient potential risk for thrombosis with hormonal agent such as Camila.  She has discontinued the medication.  She is on Xarelto starter pack since diagnosis and reports she is tolerating therapy and feels improvement in the right leg discomfort.  She will undergo hypercoagulable evaluation with labs today.  She will also be set up for a CT chest, abdomen and pelvis to evaluate for any evidence of occult malignancy.  If imaging and labs are unrevealing she is recommended for 6 months of anticoagulation.  Will repeat Dopplers in 3 months for interval evaluation.  All questions answered and she expressed understanding of the information  presented.  CBC and chemistries reviewed and WNL.   2.  Family history of thrombosis.  She reports this occurred in her mother after surgery.  Mother remains on blood thinner.  Patient is undergoing hypercoagulable evaluation.  3.  Hypertension.  Blood pressure 125/78.  Follow-up with PCP.  4.  Fibromyalgia and depression.  Follow-up with PCP.  5.  Health maintenance.  Patient reports she has undergone mammogram and colonoscopy evaluation.  Continue screenings as recommended.   HPI: 52 year old female complaining of right calf pain and primary care physician [name] ordered a right lower extremity done 11/23/2017 that showed evidence of an isolated calf DVT involving the gastrocnemius veins.  She is currently on Xarelto.  She reports pain improved once therapy was started.  She reports she had been on Camila for 1 year.  She has now discontinued the medication.  She reports her mother developed a blood clot after surgery and remains on anticoagulation.  Patient denied any history of miscarriages or prior DVT or PE.  She is seen today for consultation due to recent diagnosis of right lower extremity DVT.  Problem List Patient Active Problem List   Diagnosis Date Noted  . 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 Lymphadenopathy, cervical [R59.0] 02/21/2016  . Esophageal dysphagia [R13.10] 02/21/2016  . Essential hypertension [I10] 11/26/2015  . Special screening for malignant neoplasms, colon [Z12.11]   . Allergic rhinitis [J30.9] 08/25/2015  . Pain medication agreement signed [Z02.89] 08/01/2015  . Opioid dependence ([place]) [F11.20] 08/01/2015  . Vitamin D deficiency [E55.9] 08/23/2014  . Hyperlipidemia [E78.5] 08/23/2014  . GAD (generalized anxiety disorder) [F41.1] 09/18/2013  . Benign  paroxysmal positional vertigo [H81.10] 03/02/2013  . General counseling for prescription of oral contraceptives [Z30.09] 01/09/2013  . Dysmenorrhea [N94.6]  01/09/2013  . Depression [F32.9] 08/08/2012  . Fibromyalgia [M79.7] 08/08/2012    Past Medical History Past Medical History:  Diagnosis Date  . Anxiety   . Depression   . DVT (deep venous thrombosis) (HCC)    Right calf  . Esophagitis, eosinophilic 4/81/8563   EGD FEB 2018 35 CM 22 HPF, 20 CM 23 HPF  . Fibromyalgia    Questionable    Past Surgical History Past Surgical History:  Procedure Laterality Date  . COLONOSCOPY N/A 09/01/2015   Procedure: COLONOSCOPY;  Surgeon: Danie Binder, MD;  [facility];  Service: Endoscopy;  Laterality: N/A;  9:45 AM  . ESOPHAGOGASTRODUODENOSCOPY N/A 06/04/2016   Procedure: ESOPHAGOGASTRODUODENOSCOPY (EGD);  Surgeon: Danie Binder, MD;  Location: [facility];  Service: Endoscopy;  Laterality: N/A;  10:30 am  . SAVORY DILATION N/A 06/04/2016   Procedure: SAVORY DILATION;  Surgeon: Danie Binder, MD;  Location: [facility];  Service: Endoscopy;  Laterality: N/A;  . SHOULDER SURGERY     Left  . SHOULDER SURGERY Left    x2 surgeries    Family History Family History  Problem Relation Age of Onset  . Cancer Father        Throat  . Hypertension Mother   . Breast cancer Mother   . Diabetes Maternal Grandmother      Social History  reports that she has never smoked. She has never used smokeless tobacco. She reports that she does not drink alcohol or use drugs.  Medications  Current Outpatient Medications:  .  albuterol (PROVENTIL HFA;VENTOLIN HFA) 108 (90 Base) MCG/ACT inhaler, Inhale 2 puffs into the lungs every 6 (six) hours as needed for wheezing or shortness of breath., Disp: 1 Inhaler, Rfl: 2 .  clobetasol cream (TEMOVATE) 1.49 %, APPLY 1 APPLICATION TOPICALLY 2 (TWO) TIMES DAILY., Disp: 30 g, Rfl: 2 .  escitalopram (LEXAPRO) 20 MG tablet, Take 1 tablet (20 mg total) by mouth daily., Disp: 90 tablet, Rfl: 1 .  fluticasone (FLONASE) 50 MCG/ACT nasal spray, PLACE 2 SPRAYS INTO THE NOSE DAILY., Disp: 16 g, Rfl: 2 .  GARLIC PO,  Take 1 tablet by mouth daily., Disp: , Rfl:  .  hydrochlorothiazide (MICROZIDE) 12.5 MG capsule, TAKE 1 CAPSULE EVERY DAY, Disp: 90 capsule, Rfl: 1 .  Magnesium 250 MG TABS, Take 250 mg by mouth at bedtime. , Disp: , Rfl:  .  Milk Thistle 250 MG CAPS, Take 250 mg by mouth 2 (two) times daily. , Disp: , Rfl:  .  Multiple Vitamins-Minerals (WOMENS MULTI VITAMIN & MINERAL PO), Take 1 tablet by mouth daily., Disp: , Rfl:  .  Probiotic Product (PHILLIPS COLON HEALTH PO), Take 1 tablet by mouth daily., Disp: , Rfl:  .  Rivaroxaban 15 & 20 MG TBPK, Start with one 36m tablet by mouth twice a day w/ food x21 days. On Day 22, switch to one 286mtablet once a day w/ food., Disp: 51 each, Rfl: 0 .  traMADol (ULTRAM) 50 MG tablet, Take 1-2 tablets (50-100 mg total) by mouth every 12 (twelve) hours as needed for severe pain., Disp: 120 tablet, Rfl: 0 .  Turmeric 500 MG TABS, Take 500 capsules by mouth 3 (three) times daily. , Disp: , Rfl:  .  Vitamin D, Cholecalciferol, 1000 units CAPS, Take 1,000 Units by mouth daily. , Disp: , Rfl:  Allergies Penicillins  Review of Systems Review of Systems - Oncology ROS negative    Physical Exam  Vitals Wt Readings from Last 3 Encounters:  12/01/17 175 lb (79.4 kg)  11/22/17 174 lb (78.9 kg)  10/17/17 174 lb 9.6 oz (79.2 kg)   Temp Readings from Last 3 Encounters:  12/01/17 98.5 F (36.9 C) (Oral)  11/22/17 98 F (36.7 C) (Oral)  10/17/17 97.9 F (36.6 C) (Oral)   BP Readings from Last 3 Encounters:  12/01/17 125/78  11/22/17 123/73  10/17/17 131/82   Pulse Readings from Last 3 Encounters:  12/01/17 78  11/22/17 82  10/17/17 80   Constitutional: Well-developed, well-nourished, and in no distress.   HENT: Head: Normocephalic and atraumatic.  Mouth/Throat: No oropharyngeal exudate. Mucosa moist. Eyes: Pupils are equal, round, and reactive to light. Conjunctivae are normal. No scleral icterus.  Neck: Normal range of motion. Neck supple. No JVD  present.  Cardiovascular: Normal rate, regular rhythm and normal heart sounds.  Exam reveals no gallop and no friction rub.   No murmur heard. Pulmonary/Chest: Effort normal and breath sounds normal. No respiratory distress. No wheezes.No rales.  Abdominal: Soft. Bowel sounds are normal. No distension. There is no tenderness. There is no guarding.  Musculoskeletal: No edema or tenderness.  Lymphadenopathy: No cervical, axillary or supraclavicular adenopathy.  Neurological: Alert and oriented to person, place, and time. No cranial nerve deficit.  Skin: Skin is warm and dry. No rash noted. No erythema. No pallor.  Psychiatric: Affect and judgment normal.   Labs Appointment on 12/01/2017  Component Date Value Ref Range Status  . WBC 12/01/2017 5.8  4.0 - 10.5 K/uL Final  . RBC 12/01/2017 3.79* 3.87 - 5.11 MIL/uL Final  . Hemoglobin 12/01/2017 12.7  12.0 - 15.0 g/dL Final  . HCT 12/01/2017 38.9  36.0 - 46.0 % Final  . MCV 12/01/2017 102.6* 78.0 - 100.0 fL Final  . MCH 12/01/2017 33.5  26.0 - 34.0 pg Final  . MCHC 12/01/2017 32.6  30.0 - 36.0 g/dL Final  . RDW 12/01/2017 12.7  11.5 - 15.5 % Final  . Platelets 12/01/2017 344  150 - 400 K/uL Final  . Neutrophils Relative % 12/01/2017 61  % Final  . Neutro Abs 12/01/2017 3.5  1.7 - 7.7 K/uL Final  . Lymphocytes Relative 12/01/2017 20  % Final  . Lymphs Abs 12/01/2017 1.2  0.7 - 4.0 K/uL Final  . Monocytes Relative 12/01/2017 7  % Final  . Monocytes Absolute 12/01/2017 0.4  0.1 - 1.0 K/uL Final  . Eosinophils Relative 12/01/2017 11  % Final  . Eosinophils Absolute 12/01/2017 0.6  0.0 - 0.7 K/uL Final  . Basophils Relative 12/01/2017 1  % Final  . Basophils Absolute 12/01/2017 0.1  0.0 - 0.1 K/uL Final   Performed at [facility], [address], [place], [place] [address]  . Sodium 12/01/2017 137  135 - 145 mmol/L Final  . Potassium 12/01/2017 4.1  3.5 - 5.1 mmol/L Final  . Chloride 12/01/2017 102  98 - 111 mmol/L Final  . CO2 12/01/2017  28  22 - 32 mmol/L Final  . Glucose, Bld 12/01/2017 98  70 - 99 mg/dL Final  . BUN 12/01/2017 17  6 - 20 mg/dL Final  . Creatinine, Ser 12/01/2017 0.68  0.44 - 1.00 mg/dL Final  . Calcium 12/01/2017 9.3  8.9 - 10.3 mg/dL Final  . Total Protein 12/01/2017 7.2  6.5 - 8.1 g/dL Final  . Albumin 12/01/2017 4.0  3.5 -  5.0 g/dL Final  . AST 12/01/2017 30  15 - 41 U/L Final  . ALT 12/01/2017 35  0 - 44 U/L Final  . Alkaline Phosphatase 12/01/2017 23* 38 - 126 U/L Final  . Total Bilirubin 12/01/2017 0.5  0.3 - 1.2 mg/dL Final  . GFR calc non Af Amer 12/01/2017 >60  >60 mL/min Final  . GFR calc Af Amer 12/01/2017 >60  >60 mL/min Final   Comment: (NOTE) The eGFR has been calculated using the CKD EPI equation. This calculation has not been validated in all clinical situations. eGFR's persistently <60 mL/min signify possible Chronic Kidney Disease.   Georgiann Hahn gap 12/01/2017 7  5 - 15 Final   Performed at [facility], [address], [place], [place] [address]  . LDH 12/01/2017 153  98 - 192 U/L Final   Performed at [facility], [address] [place], [place] [address]  . aPTT 12/01/2017 34  24 - 36 seconds Final   Performed at [facility], [address] [place], [place] [address]  . Prothrombin Time 12/01/2017 17.5* 11.4 - 15.2 seconds Final  . INR 12/01/2017 1.45   Final   Performed at [facility], [address] [place], [place] [address]     Pathology Orders Placed This Encounter  Procedures  . CT CHEST W CONTRAST    Standing Status:   Future    Standing Expiration Date:   12/01/2018    Order Specific Question:   If indicated for the ordered procedure, I authorize the administration of contrast media per Radiology protocol    Answer:   Yes    Order Specific Question:   Preferred imaging location?    Answer:   [facility]    Order Specific Question:   Radiology Contrast Protocol - do NOT remove file path    Answer:   \\[email or web address]  . CT Abdomen  Pelvis W Contrast    Standing Status:   Future    Standing Expiration Date:   12/01/2018    Order Specific Question:   ** REASON FOR EXAM (FREE TEXT)    Answer:   evaluate for occult malignancy    Order Specific Question:   If indicated for the ordered procedure, I authorize the administration of contrast media per Radiology protocol    Answer:   Yes    Order Specific Question:   Is patient pregnant?    Answer:   No    Order Specific Question:   [facility]    [facility]    Order Specific Question:   Is Oral Contrast requested for this exam?    Answer:   Yes, Per Radiology protocol    Order Specific Question:   Radiology Contrast Protocol - do NOT remove file path    Answer:   \\[email or web address]  . CBC with Differential/Platelet    Standing Status:   Future    Number of Occurrences:   1    Standing Expiration Date:   12/02/2018  . Comprehensive metabolic panel    Standing Status:   Future    Number of Occurrences:   1    Standing Expiration Date:   12/02/2018  . Lactate dehydrogenase    Standing Status:   Future    Number of Occurrences:   1    Standing Expiration Date:   12/02/2018  . APTT    Standing Status:   Future    Number of Occurrences:   1  Standing Expiration Date:   12/02/2018  . Protime-INR    Standing Status:   Future    Number of Occurrences:   1    Standing Expiration Date:   12/02/2018  . Factor 5 leiden    Standing Status:   Future    Number of Occurrences:   1    Standing Expiration Date:   12/02/2018  . Prothrombin gene mutation    Standing Status:   Future    Number of Occurrences:   1    Standing Expiration Date:   12/02/2018  . Beta-2-glycoprotein i abs, IgG/M/A    Standing Status:   Future    Number of Occurrences:   1    Standing Expiration Date:   12/02/2018  . Lupus anticoagulant panel    Standing Status:   Future    Number of Occurrences:   1    Standing Expiration Date:   12/02/2018        Zoila Shutter MD

## 2017-12-02 LAB — BETA-2-GLYCOPROTEIN I ABS, IGG/M/A
Beta-2 Glyco I IgG: <9 GPI IgG units (ref 0–20)
Beta-2-Glycoprotein I IgA: <9 GPI IgA units (ref 0–25)
Beta-2-Glycoprotein I IgM: <9 GPI IgM units (ref 0–32)

## 2017-12-03 LAB — DRVVT CONFIRM: dRVVT Confirm: 1.6 ratio — ABNORMAL HIGH (ref 0.8–1.2)

## 2017-12-03 LAB — LUPUS ANTICOAGULANT PANEL
DRVVT: 82.1 s — ABNORMAL HIGH (ref 0.0–47.0)
PTT Lupus Anticoagulant: 43 s (ref 0.0–51.9)

## 2017-12-03 LAB — DRVVT MIX: dRVVT Mix: 61.4 s — ABNORMAL HIGH (ref 0.0–47.0)

## 2017-12-06 LAB — PROTHROMBIN GENE MUTATION

## 2017-12-07 LAB — FACTOR 5 LEIDEN

## 2017-12-13 ENCOUNTER — Ambulatory Visit (HOSPITAL_COMMUNITY)
Admission: RE | Admit: 2017-12-13 | Discharge: 2017-12-13 | Disposition: A | Discharge status: Home / Self Care | Payer: Federal, State, Local not specified - PPO | Source: Ambulatory Visit | Attending: Internal Medicine | Admitting: Internal Medicine

## 2017-12-13 ENCOUNTER — Encounter (HOSPITAL_COMMUNITY): Payer: Self-pay | Admitting: Radiology

## 2017-12-13 DIAGNOSIS — I700 Atherosclerosis of aorta: Secondary | ICD-10-CM | POA: Insufficient documentation

## 2017-12-13 DIAGNOSIS — N281 Cyst of kidney, acquired: Secondary | ICD-10-CM | POA: Diagnosis not present

## 2017-12-13 DIAGNOSIS — I82491 Acute embolism and thrombosis of other specified deep vein of right lower extremity: Secondary | ICD-10-CM | POA: Diagnosis not present

## 2017-12-13 DIAGNOSIS — I2510 Atherosclerotic heart disease of native coronary artery without angina pectoris: Secondary | ICD-10-CM | POA: Insufficient documentation

## 2017-12-13 MED ORDER — IOHEXOL 300 MG/ML  SOLN
100.0000 mL | Freq: Once | INTRAMUSCULAR | Status: AC | PRN
  Administered 2017-12-13: 100 mL via INTRAVENOUS

## 2017-12-14 ENCOUNTER — Encounter (HOSPITAL_COMMUNITY): Payer: Self-pay | Admitting: Internal Medicine

## 2017-12-14 ENCOUNTER — Inpatient Hospital Stay (HOSPITAL_COMMUNITY): Payer: Federal, State, Local not specified - PPO | Admitting: Internal Medicine

## 2017-12-14 ENCOUNTER — Other Ambulatory Visit: Payer: Self-pay | Admitting: Family

## 2017-12-14 VITALS — BP 156/87 | HR 76 | Temp 98.2°F | Resp 14 | Wt 173.8 lb

## 2017-12-14 DIAGNOSIS — I82491 Acute embolism and thrombosis of other specified deep vein of right lower extremity: Secondary | ICD-10-CM | POA: Diagnosis not present

## 2017-12-14 DIAGNOSIS — M797 Fibromyalgia: Secondary | ICD-10-CM | POA: Diagnosis not present

## 2017-12-14 DIAGNOSIS — I10 Essential (primary) hypertension: Secondary | ICD-10-CM | POA: Diagnosis not present

## 2017-12-14 DIAGNOSIS — F329 Major depressive disorder, single episode, unspecified: Secondary | ICD-10-CM

## 2017-12-14 DIAGNOSIS — Z832 Family history of diseases of the blood and blood-forming organs and certain disorders involving the immune mechanism: Secondary | ICD-10-CM

## 2017-12-14 DIAGNOSIS — Z803 Family history of malignant neoplasm of breast: Secondary | ICD-10-CM | POA: Diagnosis not present

## 2017-12-14 DIAGNOSIS — Z7901 Long term (current) use of anticoagulants: Secondary | ICD-10-CM

## 2017-12-14 DIAGNOSIS — Z808 Family history of malignant neoplasm of other organs or systems: Secondary | ICD-10-CM | POA: Diagnosis not present

## 2017-12-14 DIAGNOSIS — D6862 Lupus anticoagulant syndrome: Secondary | ICD-10-CM | POA: Diagnosis not present

## 2017-12-14 MED ORDER — RIVAROXABAN 20 MG PO TABS: 20.0000 mg | ORAL_TABLET | Freq: Every day | ORAL | 4 refills | Status: DC

## 2017-12-14 MED ORDER — RIVAROXABAN 20 MG PO TABS: 20.0000 mg | ORAL_TABLET | Freq: Every day | ORAL | 0 refills | Status: DC

## 2017-12-14 NOTE — Telephone Encounter (Signed)
Go ahead and call in Xarelto 20 mg daily and give her a month's worth and [facility]

## 2017-12-14 NOTE — Patient Instructions (Signed)
[facility] Discharge Instructions  You saw Dr. Higgs today.   Thank you for choosing [facility] to provide your oncology and hematology care.  To afford each patient quality time with our provider, please arrive at least 15 minutes before your scheduled appointment time.   If you have a lab appointment with the Cancer Center please come in thru the  Main Entrance and check in at the main information desk  You need to re-schedule your appointment should you arrive 10 or more minutes late.  We strive to give you quality time with our providers, and arriving late affects you and other patients whose appointments are after yours.  Also, if you no show three or more times for appointments you may be dismissed from the clinic at the providers discretion.     Again, thank you for choosing [facility].  Our hope is that these requests will decrease the amount of time that you wait before being seen by our physicians.       _____________________________________________________________  Should you have questions after your visit to  Cancer Center, please contact our office at [phone] between the hours of 8:00 a.m. and 4:30 p.m.  Voicemails left after 4:00 p.m. will not be returned until the following business day.  For prescription refill requests, have your pharmacy contact our office and allow 72 hours.    Cancer Center Support Programs:   > Cancer Support Group  2nd Tuesday of the month 1pm-2pm, Journey Room    

## 2017-12-14 NOTE — Progress Notes (Signed)
Diagnosis Acute deep vein thrombosis (DVT) of other specified vein of right lower extremity ([place]) - Plan: US Venous Img Lower Bilateral  Staging Cancer Staging No matching staging information was found for the patient.  Assessment and Plan:  1.  Right lower extremity DVT.   Patient reported right calf pain and subsequently underwent right lower extremity Doppler done 11/23/2017 that showed evidence of an isolated calf DVT involving the gastrocnemius veins.  She is currently on Xarelto.  She reports pain improved once therapy was started.  She reports she had been on Camila for 1 year which is an oral progesterone contraceptive.  She has now discontinued the medication.  She reports her mother developed a blood clot after surgery and remains on anticoagulation.  Patient denied any history of miscarriages or prior DVT or PE.   She denied any extensive travel or recent surgery.    Labs done 12/01/2017 reviewed with pt and showed WBC 5.8 HB 12.7 plts 344,000, chemistries WNL with Cr 0.68, LFTS WNL.  Hypercoagulable work-up shows normal Factor V leiden, PT gene mutation and Beta 2 glycoprotein.    Pt has a + Lupus anticoagulant.  I have discussed with her this is a nonspecific finding and can be affected by anticoagulation.  She will have repeat testing done once off Xarelto.    CT CAP done 12/13/2017 reviewed and showed  IMPRESSION: 1. No evidence of occult malignancy involving the chest, abdomen or pelvis. 2.  No acute cardiopulmonary disease. 3. Mild LAD coronary atherosclerosis. 4. No acute abnormalities involving the abdomen or pelvis. 5.  Aortic Atherosclerosis, mild.  (ICD10-170.0)  I discussed potential risk for thrombosis with hormonal agent such as Camila.  She has discontinued the medication.  She is on Xarelto and is requesting refills on medication.  RX for Xarelto 20 mg po daily sent to pharmacy.  She will be set up for  Bilateral LE doppler in 01/2018 for follow-up evaluation and she  will RTC to go over results at that time.  I have discussed with her to stay active and to use Tylenol for pain if needed for right calf pain.   She feels pain has improved since starting Xarelto.    All questions answered and she expressed understanding of the information presented.   2.  Positive Lupus anticoagulant.  Labs done 12/01/2017 reviewed with pt and showed + LA.  I have discussed with her this is a nonspecific finding and can be affected by anticoagulation.  She will have repeat testing done once off Xarelto.    3.  Family history of thrombosis.  She reports this occurred in her mother after surgery.  Mother remains on blood thinner.   4.  Hypertension.  Blood pressure is 156/87.  Follow-up with PCP.  5.  Fibromyalgia and depression.  Follow-up with PCP.  6.  Health maintenance.  Patient reports she has undergone mammogram and colonoscopy evaluation.  Continue screenings as recommended.  Greater than 30 minutes spent with more than 50% spent in counseling and coordination of care.     Interval history:  Historical data obtained from the note dated 12/01/2017.  52 year old female complaining of right calf pain and primary care physician [name] ordered a right lower extremity done 11/23/2017 that showed evidence of an isolated calf DVT involving the gastrocnemius veins.  She is currently on Xarelto.  She reports pain improved once therapy was started.  She reports she had been on Camila for 1 year.  She has now discontinued  the medication.  She reports her mother developed a blood clot after surgery and remains on anticoagulation.  Patient denied any history of miscarriages or prior DVT or PE.    Current Status:  Pt is seen today for follow-up.  She is here to go over scans and labs.     Problem List Patient Active Problem List   Diagnosis Date Noted  . 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 Lymphadenopathy, cervical [R59.0] 02/21/2016  .  Esophageal dysphagia [R13.10] 02/21/2016  . Essential hypertension [I10] 11/26/2015  . Special screening for malignant neoplasms, colon [Z12.11]   . Allergic rhinitis [J30.9] 08/25/2015  . Pain medication agreement signed [Z02.89] 08/01/2015  . Opioid dependence ([place]) [F11.20] 08/01/2015  . Vitamin D deficiency [E55.9] 08/23/2014  . Hyperlipidemia [E78.5] 08/23/2014  . GAD (generalized anxiety disorder) [F41.1] 09/18/2013  . Benign paroxysmal positional vertigo [H81.10] 03/02/2013  . General counseling for prescription of oral contraceptives [Z30.09] 01/09/2013  . Dysmenorrhea [N94.6] 01/09/2013  . Depression [F32.9] 08/08/2012  . Fibromyalgia [M79.7] 08/08/2012    Past Medical History Past Medical History:  Diagnosis Date  . Anxiety   . Depression   . DVT (deep venous thrombosis) (HCC)    Right calf  . Esophagitis, eosinophilic 9/47/0962   EGD FEB 2018 35 CM 22 HPF, 20 CM 23 HPF  . Fibromyalgia    Questionable    Past Surgical History Past Surgical History:  Procedure Laterality Date  . COLONOSCOPY N/A 09/01/2015   Procedure: COLONOSCOPY;  Surgeon: Danie Binder, MD;  [facility];  Service: Endoscopy;  Laterality: N/A;  9:45 AM  . ESOPHAGOGASTRODUODENOSCOPY N/A 06/04/2016   Procedure: ESOPHAGOGASTRODUODENOSCOPY (EGD);  Surgeon: Danie Binder, MD;  [facility];  Service: Endoscopy;  Laterality: N/A;  10:30 am  . SAVORY DILATION N/A 06/04/2016   Procedure: SAVORY DILATION;  Surgeon: Danie Binder, MD;  [facility];  Service: Endoscopy;  Laterality: N/A;  . SHOULDER SURGERY     Left  . SHOULDER SURGERY Left    x2 surgeries    Family History Family History  Problem Relation Age of Onset  . Cancer Father        Throat  . Hypertension Mother   . Breast cancer Mother   . Diabetes Maternal Grandmother      Social History  reports that she has never smoked. She has never used smokeless tobacco. She reports that she does not drink alcohol  or use drugs.  Medications  Current Outpatient Medications:  .  albuterol (PROVENTIL HFA;VENTOLIN HFA) 108 (90 Base) MCG/ACT inhaler, Inhale 2 puffs into the lungs every 6 (six) hours as needed for wheezing or shortness of breath., Disp: 1 Inhaler, Rfl: 2 .  clobetasol cream (TEMOVATE) 8.36 %, APPLY 1 APPLICATION TOPICALLY 2 (TWO) TIMES DAILY., Disp: 30 g, Rfl: 2 .  escitalopram (LEXAPRO) 20 MG tablet, Take 1 tablet (20 mg total) by mouth daily., Disp: 90 tablet, Rfl: 1 .  fluticasone (FLONASE) 50 MCG/ACT nasal spray, PLACE 2 SPRAYS INTO THE NOSE DAILY., Disp: 16 g, Rfl: 2 .  GARLIC PO, Take 1 tablet by mouth daily., Disp: , Rfl:  .  hydrochlorothiazide (MICROZIDE) 12.5 MG capsule, TAKE 1 CAPSULE EVERY DAY, Disp: 90 capsule, Rfl: 1 .  Magnesium 250 MG TABS, Take 250 mg by mouth at bedtime. , Disp: , Rfl:  .  Milk Thistle 250 MG CAPS, Take 250 mg by mouth 2 (two) times daily. , Disp: ,  Rfl:  .  Multiple Vitamins-Minerals (WOMENS MULTI VITAMIN & MINERAL PO), Take 1 tablet by mouth daily., Disp: , Rfl:  .  Probiotic Product (PHILLIPS COLON HEALTH PO), Take 1 tablet by mouth daily., Disp: , Rfl:  .  traMADol (ULTRAM) 50 MG tablet, Take 1-2 tablets (50-100 mg total) by mouth every 12 (twelve) hours as needed for severe pain., Disp: 120 tablet, Rfl: 0 .  Turmeric 500 MG TABS, Take 500 capsules by mouth 3 (three) times daily. , Disp: , Rfl:  .  Vitamin D, Cholecalciferol, 1000 units CAPS, Take 1,000 Units by mouth daily. , Disp: , Rfl:  .  rivaroxaban (XARELTO) 20 MG TABS tablet, Take 1 tablet (20 mg total) by mouth daily with supper., Disp: 30 tablet, Rfl: 4  Allergies Penicillins  Review of Systems Review of Systems - Oncology ROS negative other than right calf swelling   Physical Exam  Vitals Wt Readings from Last 3 Encounters:  12/14/17 173 lb 12.8 oz (78.8 kg)  12/01/17 175 lb (79.4 kg)  11/22/17 174 lb (78.9 kg)   Temp Readings from Last 3 Encounters:  12/14/17 98.2 F (36.8 C)  (Oral)  12/01/17 98.5 F (36.9 C) (Oral)  11/22/17 98 F (36.7 C) (Oral)   BP Readings from Last 3 Encounters:  12/14/17 (!) 156/87  12/01/17 125/78  11/22/17 123/73   Pulse Readings from Last 3 Encounters:  12/14/17 76  12/01/17 78  11/22/17 82   Constitutional: Well-developed, well-nourished, and in no distress.   HENT: Head: Normocephalic and atraumatic.  Mouth/Throat: No oropharyngeal exudate. Mucosa moist. Eyes: Pupils are equal, round, and reactive to light. Conjunctivae are normal. No scleral icterus.  Neck: Normal range of motion. Neck supple. No JVD present.  Cardiovascular: Normal rate, regular rhythm and normal heart sounds.  Exam reveals no gallop and no friction rub.   No murmur heard. Pulmonary/Chest: Effort normal and breath sounds normal. No respiratory distress. No wheezes.No rales.  Abdominal: Soft. Bowel sounds are normal. No distension. There is no tenderness. There is no guarding.  Musculoskeletal: No edema.  Minor tenderness at right calf.   Lymphadenopathy: No cervical or supraclavicular adenopathy.  Neurological: Alert and oriented to person, place, and time. No cranial nerve deficit.  Skin: Skin is warm and dry. No rash noted. No erythema. No pallor.  Psychiatric: Affect and judgment normal.   Labs No visits with results within 3 Day(s) from this visit.  Latest known visit with results is:  Lab on 12/01/2017  Component Date Value Ref Range Status  . WBC 12/01/2017 5.8  4.0 - 10.5 K/uL Final  . RBC 12/01/2017 3.79* 3.87 - 5.11 MIL/uL Final  . Hemoglobin 12/01/2017 12.7  12.0 - 15.0 g/dL Final  . HCT 12/01/2017 38.9  36.0 - 46.0 % Final  . MCV 12/01/2017 102.6* 78.0 - 100.0 fL Final  . MCH 12/01/2017 33.5  26.0 - 34.0 pg Final  . MCHC 12/01/2017 32.6  30.0 - 36.0 g/dL Final  . RDW 12/01/2017 12.7  11.5 - 15.5 % Final  . Platelets 12/01/2017 344  150 - 400 K/uL Final  . Neutrophils Relative % 12/01/2017 61  % Final  . Neutro Abs 12/01/2017 3.5  1.7  - 7.7 K/uL Final  . Lymphocytes Relative 12/01/2017 20  % Final  . Lymphs Abs 12/01/2017 1.2  0.7 - 4.0 K/uL Final  . Monocytes Relative 12/01/2017 7  % Final  . Monocytes Absolute 12/01/2017 0.4  0.1 - 1.0 K/uL Final  . Eosinophils Relative  12/01/2017 11  % Final  . Eosinophils Absolute 12/01/2017 0.6  0.0 - 0.7 K/uL Final  . Basophils Relative 12/01/2017 1  % Final  . Basophils Absolute 12/01/2017 0.1  0.0 - 0.1 K/uL Final   Performed at [facility], [address], [place], [place] [address]  . Sodium 12/01/2017 137  135 - 145 mmol/L Final  . Potassium 12/01/2017 4.1  3.5 - 5.1 mmol/L Final  . Chloride 12/01/2017 102  98 - 111 mmol/L Final  . CO2 12/01/2017 28  22 - 32 mmol/L Final  . Glucose, Bld 12/01/2017 98  70 - 99 mg/dL Final  . BUN 12/01/2017 17  6 - 20 mg/dL Final  . Creatinine, Ser 12/01/2017 0.68  0.44 - 1.00 mg/dL Final  . Calcium 12/01/2017 9.3  8.9 - 10.3 mg/dL Final  . Total Protein 12/01/2017 7.2  6.5 - 8.1 g/dL Final  . Albumin 12/01/2017 4.0  3.5 - 5.0 g/dL Final  . AST 12/01/2017 30  15 - 41 U/L Final  . ALT 12/01/2017 35  0 - 44 U/L Final  . Alkaline Phosphatase 12/01/2017 23* 38 - 126 U/L Final  . Total Bilirubin 12/01/2017 0.5  0.3 - 1.2 mg/dL Final  . GFR calc non Af Amer 12/01/2017 >60  >60 mL/min Final  . GFR calc Af Amer 12/01/2017 >60  >60 mL/min Final   Comment: (NOTE) The eGFR has been calculated using the CKD EPI equation. This calculation has not been validated in all clinical situations. eGFR's persistently <60 mL/min signify possible Chronic Kidney Disease.   Georgiann Hahn gap 12/01/2017 7  5 - 15 Final   Performed at [facility], [address] [place], [place] [address]  . LDH 12/01/2017 153  98 - 192 U/L Final   Performed at [facility], [address], [place], [place] [address]  . aPTT 12/01/2017 34  24 - 36 seconds Final   Performed at [facility], [address], [place], [place] [address]  . Prothrombin Time 12/01/2017 17.5* 11.4 - 15.2  seconds Final  . INR 12/01/2017 1.45   Final   Performed at [facility], [address], [place], [place] [address]  . Recommendations-F5LEID: 12/01/2017 Comment   Final   Comment: (NOTE) Result:  Negative (no mutation found) Factor V Leiden is a specific mutation (R506Q) in the factor V gene that is associated with an increased risk of venous thrombosis. Factor V Leiden is more resistant to inactivation by activated protein C.  As a result, factor V persists in the circulation leading to a mild hyper- coagulable state.  The Leiden mutation accounts for 90% - 95% of APC resistance.  Factor V Leiden has been reported in patients with deep vein thrombosis, pulmonary embolus, central retinal vein occlusion, cerebral sinus thrombosis and hepatic vein thrombosis. Other risk factors to be considered in the workup for venous thrombosis include the G20210A mutation in the factor II (prothrombin) gene, protein S and C deficiency, and antithrombin deficiencies. Anticardiolipin antibody and lupus anticoagulant analysis may be appropriate for certain patients, as well as homocysteine levels. Contact your local [facility] for information on how to order additi                          onal testing if desired. **Genetic counselors are available for health care providers to**  discuss results at [phone]-GENE [phone]). Methodology: DNA analysis of the Factor V gene was performed by allele-specific PCR. The diagnostic sensitivity and  specificity is >99% for both. Molecular-based testing is highly accurate, but as in any laboratory test, diagnostic errors may occur. All test results must be combined with clinical information for the most accurate interpretation. This test was developed and its performance characteristics determined by [facility]. It has not been cleared or approved by the Food and Drug Administration. References: Voelkerding K (1996).  Clin Lab Med [phone]. Allison Quarry, PhD,  [facility] Ruben Reason, PhD, [facility] Annetta Maw, M.S., PhD, [facility] Alfredo Bach, PhD, [facility] Norva Riffle, PhD, [facility] Earlean Polka PhD, [facility] Performed At: [facility] [address] [place], [place] [id number] Nechama Guard MD WY:[id number]                          2   . Recommendations-PTGENE: 12/01/2017 Comment   Final   Comment: (NOTE) NEGATIVE No mutation identified. Comment: A point mutation (G20210A) in the factor II (prothrombin) gene is the second most common cause of inherited thrombophilia. The incidence of this mutation in the [place] Caucasian population is about 2% and in the [place] American population it is approximately 0.5%. This mutation is rare in the [place] and Native American population. Being heterozygous for a prothrombin mutation increases the risk for developing venous thrombosis about 2 to 3 times above the general population risk. Being homozygous for the prothrombin gene mutation increases the relative risk for venous thrombosis further, although it is not yet known how much further the risk is increased. In women heterozygous for the prothrombin gene mutation, the use of estrogen containing oral contraceptives increases the relative risk of venous thrombosis about 16 times and the risk of developing cerebral thrombosis is also significantly increased. In pregnancy the pr                          othrombin gene mutation increases risk for venous thrombosis and may increase risk for stillbirth, placental abruption, pre-eclampsia and fetal growth restriction. If the patient possesses two or more congenital or acquired thrombophilic risk factors, the risk for thrombosis may rise to more than the sum of the risk ratios for the individual mutations. This assay detects only the prothrombin G20210A mutation and does not measure genetic abnormalities elsewhere in the genome. Other thrombotic risk factors may be pursued through systematic clinical  laboratory analysis. These factors include the R506Q (Leiden) mutation in the Factor V gene, plasma homocysteine levels, as well as testing for deficiencies of antithrombin III, protein C and protein S. Genetic Counselors are available for health care providers to discuss results at [phone]-GENE [phone]). Methodology: DNA analysis of the Factor II gene was performed by PCR amplification followed by restriction analysis. The di                          agnostic sensitivity is >99% for both. All the tests must be combined with clinical information for the most accurate interpretation. Molecular-based testing is highly accurate, but as in any laboratory test, diagnostic errors may occur. This test was developed and its performance characteristics determined by [facility]. It has not been cleared or approved by the Food and Drug Administration. Poort SR, et al. Blood. 1996; 28:7867-6720. Varga EA. Circulation. 2004; 947:S96-G83. Mervin Hack, et [place]; 19:700-703. Allison Quarry, PhD, [facility] Ruben Reason, PhD, [facility] Annetta Maw, M.S., PhD, [facility] Alfredo Bach, PhD, [facility] Norva Riffle, PhD, [facility]  Earlean Polka, PhD, [facility] Performed At: [facility] [address] [place], [place] [id number] Nechama Guard MD CG:[id number]   . Beta-2 Glyco I IgG 12/01/2017 <9  0 - 20 GPI IgG units Final   Comment: (NOTE) The reference interval reflects a 3SD or 99th percentile interval, which is thought to represent a potentially clinically significant result in accordance with the International Consensus Statement on the classification criteria for definitive antiphospholipid syndrome (APS). J Thromb Haem 2006;4:295-306.   . Beta-2-Glycoprotein I IgM 12/01/2017 <9  0 - 32 GPI IgM units Final   Comment: (NOTE) The reference interval reflects a 3SD or 99th percentile interval, which is thought to represent a potentially clinically  significant result in accordance with the International Consensus Statement on the classification criteria for definitive antiphospholipid syndrome (APS). J Thromb Haem 2006;4:295-306. Performed At: [facility] [place], [place] [id number] Rush Farmer MD [id number]   . Beta-2-Glycoprotein I IgA 12/01/2017 <9  0 - 25 GPI IgA units Final   Comment: (NOTE) The reference interval reflects a 3SD or 99th percentile interval, which is thought to represent a potentially clinically significant result in accordance with the International Consensus Statement on the classification criteria for definitive antiphospholipid syndrome (APS). J Thromb Haem 2006;4:295-306.   Marland Kitchen PTT Lupus Anticoagulant 12/01/2017 43.0  0.0 - 51.9 sec Final  . DRVVT 12/01/2017 82.1* 0.0 - 47.0 sec Final  . Lupus Anticoag Interp 12/01/2017 Comment:   Corrected   Comment: (NOTE) Results are consistent with the presence of a lupus anticoagulant. NOTE: Only persistent lupus anticoagulants are thought to be of clinical significance. For this reason, repeat testing in 12 or more weeks after an initial positive result should be considered to confirm or refute the presence of a lupus anticoagulant, depending on clinical presentation. Results of lupus anticoagulant tests may be falsely positive in the presence of certain anticoagulant therapies. Performed At: [facility] [place], [place] [id number] Rush Farmer MD PH:[id number]   . dRVVT Mix 12/01/2017 61.4* 0.0 - 47.0 sec Final   Comment: (NOTE) Performed At: [facility] [place], [place] [id number] Rush Farmer MD [id number]   . dRVVT Confirm 12/01/2017 1.6* 0.8 - 1.2 ratio Final   Comment: (NOTE) Performed At: [facility] [place], [place] [id number] Rush Farmer MD [id number]      Pathology Orders Placed This Encounter  Procedures  . US Venous Img  Lower Bilateral    Standing Status:   Future    Standing Expiration Date:   12/14/2018    Order Specific Question:   Reason for Exam (SYMPTOM  OR DIAGNOSIS REQUIRED)    Answer:   right lower extremity DVT followup    Order Specific Question:   Preferred imaging location?    Answer:   [facility]       Zoila Shutter MD

## 2017-12-14 NOTE — Telephone Encounter (Signed)
Looks like patient needs xarelto 20mg . Please advise

## 2018-01-11 ENCOUNTER — Other Ambulatory Visit: Payer: Self-pay | Admitting: Family

## 2018-01-11 NOTE — Telephone Encounter (Signed)
Last seen 11/22/17  Christy 

## 2018-01-17 ENCOUNTER — Encounter: Payer: Self-pay | Admitting: Family

## 2018-01-17 ENCOUNTER — Ambulatory Visit: Payer: Federal, State, Local not specified - PPO | Admitting: Family

## 2018-01-17 VITALS — BP 122/82 | HR 75 | Temp 99.1°F | Ht 67.0 in | Wt 171.2 lb

## 2018-01-17 DIAGNOSIS — F1120 Opioid dependence, uncomplicated: Secondary | ICD-10-CM

## 2018-01-17 DIAGNOSIS — F411 Generalized anxiety disorder: Secondary | ICD-10-CM | POA: Diagnosis not present

## 2018-01-17 DIAGNOSIS — K2960 Other gastritis without bleeding: Secondary | ICD-10-CM

## 2018-01-17 DIAGNOSIS — M797 Fibromyalgia: Secondary | ICD-10-CM

## 2018-01-17 DIAGNOSIS — I10 Essential (primary) hypertension: Secondary | ICD-10-CM | POA: Diagnosis not present

## 2018-01-17 DIAGNOSIS — Z0289 Encounter for other administrative examinations: Secondary | ICD-10-CM

## 2018-01-17 DIAGNOSIS — F331 Major depressive disorder, recurrent, moderate: Secondary | ICD-10-CM

## 2018-01-17 DIAGNOSIS — E559 Vitamin D deficiency, unspecified: Secondary | ICD-10-CM

## 2018-01-17 DIAGNOSIS — E785 Hyperlipidemia, unspecified: Secondary | ICD-10-CM

## 2018-01-17 MED ORDER — TRAMADOL HCL 50 MG PO TABS: 50.0000 mg | ORAL_TABLET | Freq: Two times a day (BID) | ORAL | 0 refills | Status: DC | PRN

## 2018-01-17 NOTE — Patient Instructions (Signed)

## 2018-01-17 NOTE — Progress Notes (Signed)
Subjective:    Patient ID: Rita Barry, female    DOB: 1965-09-23, 52 y.o.   MRN: [id number]  Chief Complaint  Patient presents with  . pain management   Pt presents to the office today for chronic follow up and pain medication. Since our last visit she was diagnosed with a right DVT and started on xarelto.  Hypertension  This is a chronic problem. The current episode started more than 1 year ago. The problem has been resolved since onset. The problem is controlled. Associated symptoms include anxiety. Pertinent negatives include no peripheral edema or shortness of breath. Risk factors for coronary artery disease include dyslipidemia and sedentary lifestyle. The current treatment provides moderate improvement. There is no history of kidney disease, CAD/MI, CVA or heart failure.  Gastroesophageal Reflux  She reports no belching, no coughing or no heartburn. This is a chronic problem. The current episode started more than 1 year ago. The problem occurs occasionally. The problem has been waxing and waning. The symptoms are aggravated by certain foods. She has tried a diet change for the symptoms. The treatment provided moderate relief.  Hyperlipidemia  This is a chronic problem. The current episode started more than 1 year ago. The problem is uncontrolled. Recent lipid tests were reviewed and are high. Pertinent negatives include no shortness of breath. Current antihyperlipidemic treatment includes diet change and exercise. The current treatment provides mild improvement of lipids. Risk factors for coronary artery disease include dyslipidemia, hypertension and a sedentary lifestyle.  Depression         This is a chronic problem.  The current episode started more than 1 year ago.   The onset quality is gradual.   The problem occurs intermittently.  The problem has been resolved since onset.  Associated symptoms include no decreased concentration, no helplessness, no hopelessness, not irritable, no  decreased interest and not sad.  Past treatments include SSRIs - Selective serotonin reuptake inhibitors.  Compliance with treatment is good.  Past medical history includes anxiety.   Anxiety  Presents for follow-up visit. Symptoms include excessive worry and nervous/anxious behavior. Patient reports no decreased concentration, depressed mood, irritability or shortness of breath. Symptoms occur occasionally. The severity of symptoms is moderate.        Review of Systems  Constitutional: Negative for irritability.  Respiratory: Negative for cough and shortness of breath.   Gastrointestinal: Negative for heartburn.  Psychiatric/Behavioral: Positive for depression. Negative for decreased concentration. The patient is nervous/anxious.   All other systems reviewed and are negative.      Objective:   Physical Exam  Constitutional: She is oriented to person, place, and time. She appears well-developed and well-nourished. She is not irritable. No distress.  HENT:  Head: Normocephalic and atraumatic.  Right Ear: External ear normal.  Left Ear: External ear normal.  Mouth/Throat: Oropharynx is clear and moist.  Eyes: Pupils are equal, round, and reactive to light.  Neck: Normal range of motion. Neck supple. No thyromegaly present.  Cardiovascular: Normal rate, regular rhythm, normal heart sounds and intact distal pulses.  No murmur heard. Pulmonary/Chest: Effort normal and breath sounds normal. No respiratory distress. She has no wheezes.  Abdominal: Soft. Bowel sounds are normal. She exhibits no distension. There is no tenderness.  Musculoskeletal: Normal range of motion. She exhibits no edema or tenderness.  Neurological: She is alert and oriented to person, place, and time. She has normal reflexes. No cranial nerve deficit.  Skin: Skin is warm and dry.  Psychiatric: She  has a normal mood and affect. Her behavior is normal. Judgment and thought content normal.  Vitals  reviewed.     BP 122/82   Pulse 75   Temp 99.1 F (37.3 C) (Oral)   Ht 5\' 7"  (1.702 m)   Wt 171 lb 3.2 oz (77.7 kg)   LMP 04/16/2016 (Approximate)   BMI 26.81 kg/m      Assessment & Plan:  Rita Barry comes in today with chief complaint of pain management and Medical Management of Chronic Issues   Diagnosis and orders addressed:  1. Moderate episode of recurrent major depressive disorder (HCC)  2. Fibromyalgia - traMADol (ULTRAM) 50 MG tablet; Take 1-2 tablets (50-100 mg total) by mouth every 12 (twelve) hours as needed for severe pain.  Dispense: 120 tablet; Refill: 0  3. Essential hypertension   4. GAD (generalized anxiety disorder) - traMADol (ULTRAM) 50 MG tablet; Take 1-2 tablets (50-100 mg total) by mouth every 12 (twelve) hours as needed for severe pain.  Dispense: 120 tablet; Refill: 0  5. Hyperlipidemia, unspecified hyperlipidemia type  6. Pain medication agreement signed  - traMADol (ULTRAM) 50 MG tablet; Take 1-2 tablets (50-100 mg total) by mouth every 12 (twelve) hours as needed for severe pain.  Dispense: 120 tablet; Refill: 0  7. Uncomplicated opioid dependence (HCC) - traMADol (ULTRAM) 50 MG tablet; Take 1-2 tablets (50-100 mg total) by mouth every 12 (twelve) hours as needed for severe pain.  Dispense: 120 tablet; Refill: 0  8. Vitamin D deficiency   9. Erosive gastritis    Labs pending Health Maintenance reviewed PT reviewed in [facility] controlled, no red flags noted, only has received controlled pain medication from   Diet and exercise encouraged  Follow up plan: 3 months for CPE    [name], FNP

## 2018-01-22 LAB — TOXASSURE SELECT 13 (MW), URINE

## 2018-02-09 ENCOUNTER — Ambulatory Visit (HOSPITAL_COMMUNITY)
Admission: RE | Admit: 2018-02-09 | Discharge: 2018-02-09 | Disposition: A | Discharge status: Home / Self Care | Payer: Federal, State, Local not specified - PPO | Source: Ambulatory Visit | Attending: Internal Medicine | Admitting: Internal Medicine

## 2018-02-09 DIAGNOSIS — I82491 Acute embolism and thrombosis of other specified deep vein of right lower extremity: Secondary | ICD-10-CM | POA: Diagnosis not present

## 2018-02-09 DIAGNOSIS — I82409 Acute embolism and thrombosis of unspecified deep veins of unspecified lower extremity: Secondary | ICD-10-CM | POA: Diagnosis not present

## 2018-02-13 ENCOUNTER — Encounter (HOSPITAL_COMMUNITY): Payer: Self-pay | Admitting: Internal Medicine

## 2018-02-13 ENCOUNTER — Inpatient Hospital Stay (HOSPITAL_COMMUNITY): Payer: Federal, State, Local not specified - PPO | Attending: Internal Medicine | Admitting: Internal Medicine

## 2018-02-13 VITALS — BP 154/89 | HR 79 | Temp 97.9°F | Resp 18 | Wt 172.4 lb

## 2018-02-13 DIAGNOSIS — D6862 Lupus anticoagulant syndrome: Secondary | ICD-10-CM

## 2018-02-13 DIAGNOSIS — F329 Major depressive disorder, single episode, unspecified: Secondary | ICD-10-CM | POA: Diagnosis not present

## 2018-02-13 DIAGNOSIS — Z7901 Long term (current) use of anticoagulants: Secondary | ICD-10-CM

## 2018-02-13 DIAGNOSIS — I82491 Acute embolism and thrombosis of other specified deep vein of right lower extremity: Secondary | ICD-10-CM

## 2018-02-13 DIAGNOSIS — I82461 Acute embolism and thrombosis of right calf muscular vein: Secondary | ICD-10-CM | POA: Diagnosis not present

## 2018-02-13 DIAGNOSIS — I10 Essential (primary) hypertension: Secondary | ICD-10-CM

## 2018-02-13 DIAGNOSIS — M797 Fibromyalgia: Secondary | ICD-10-CM | POA: Insufficient documentation

## 2018-02-13 MED ORDER — RIVAROXABAN 20 MG PO TABS: 20.0000 mg | ORAL_TABLET | Freq: Every day | ORAL | 1 refills | Status: DC

## 2018-02-13 NOTE — Progress Notes (Signed)
Diagnosis Acute deep vein thrombosis (DVT) of other specified vein of right lower extremity (HCC) - Plan: CBC with Differential/Platelet, Comprehensive metabolic panel, Lactate dehydrogenase, Protime-INR, APTT, Lupus anticoagulant panel  Staging Cancer Staging No matching staging information was found for the patient.  Assessment and Plan:   1.  Right lower extremity DVT.   Patient reported right calf pain and subsequently underwent right lower extremity Doppler done 11/23/2017 that showed evidence of an isolated calf DVT involving the gastrocnemius veins.  She is currently on Xarelto.  She reports pain improved once therapy was started.  She reports she had been on Camila for 1 year which is an oral progesterone contraceptive.  She has now discontinued the medication.  She reports her mother developed a blood clot after surgery and remains on anticoagulation.  Patient denied any history of miscarriages or prior DVT or PE.   She denied any extensive travel or recent surgery.    Labs done 12/01/2017 reviewed with pt and showed WBC 5.8 HB 12.7 plts 344,000, chemistries WNL with Cr 0.68, LFTS WNL.  Hypercoagulable work-up shows normal Factor V leiden, PT gene mutation and Beta 2 glycoprotein.    Pt has a + Lupus anticoagulant.  I have discussed with her this is a nonspecific finding and can be affected by anticoagulation.  She will have repeat testing done once off Xarelto.    CT CAP done 12/13/2017 showed  IMPRESSION: 1. No evidence of occult malignancy involving the chest, abdomen or pelvis. 2.  No acute cardiopulmonary disease. 3. Mild LAD coronary atherosclerosis. 4. No acute abnormalities involving the abdomen or pelvis. 5.  Aortic Atherosclerosis, mild.  (ICD10-170.0)  I discussed potential risk for thrombosis with hormonal agent such as Camila.  She has discontinued the medication.  She is on Xarelto 20 mg po daily # 30 with refills sent to pharmacy.    Pt underwent Bilateral LE doppler  02/09/2018 that was reviewed and showed  IMPRESSION: No evidence of acute or chronic DVT within either lower extremity with special attention paid to the paired right gastrocnemius veins.  Pt will continue Xarelto for 6 months of therapy which will be completed in 05/2018.  She will have repeat LA testing done in 07/2018 after she has been off therapy for 6 weeks.  She will follow-up at that time to go over results.  Pt reports she is tolerating Xarelto and is ok with continuing therapy to complete 6 months.  All questions answered and she expressed understanding of the information presented.   2.  Positive Lupus anticoagulant.  Labs done 12/01/2017 reviewed with pt and showed + LA.  I have discussed with her this is a nonspecific finding and can be affected by anticoagulation.  She will have repeat testing done once off Xarelto in 07/2018.    3.  Family history of thrombosis.  She reports this occurred in her mother after surgery.  Mother remains on blood thinner.   4.  Hypertension.  Blood pressure is 154/89.    Follow-up with PCP.  5.  Fibromyalgia and depression.  Follow-up with PCP.  6.  Health maintenance. Continue GI and mammogram screenings as recommended.  25  minutes spent with more than 50% spent in counseling and coordination of care.     Interval history:  Historical data obtained from the note dated 12/01/2017.  52 year old female complaining of right calf pain and primary care physician Rita Barry ordered a right lower extremity done 11/23/2017 that showed evidence of an isolated calf  DVT involving the gastrocnemius veins.  She is currently on Xarelto.  She reports pain improved once therapy was started.  She reports she had been on Camila for 1 year.  She has now discontinued the medication.  She reports her mother developed a blood clot after surgery and remains on anticoagulation.  Patient denied any history of miscarriages or prior DVT or PE.    Current Status:  Pt is seen today  for follow-up.  She is here to go over dopplers.  She denies any bleeding episodes.    Problem List Patient Active Problem List   Diagnosis Date Noted  . 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 Lymphadenopathy, cervical [R59.0] 02/21/2016  . Esophageal dysphagia [R13.10] 02/21/2016  . Essential hypertension [I10] 11/26/2015  . Special screening for malignant neoplasms, colon [Z12.11]   . Allergic rhinitis [J30.9] 08/25/2015  . Pain medication agreement signed [Z02.89] 08/01/2015  . Opioid dependence (HCC) [F11.20] 08/01/2015  . Vitamin D deficiency [E55.9] 08/23/2014  . Hyperlipidemia [E78.5] 08/23/2014  . GAD (generalized anxiety disorder) [F41.1] 09/18/2013  . Benign paroxysmal positional vertigo [H81.10] 03/02/2013  . General counseling for prescription of oral contraceptives [Z30.09] 01/09/2013  . Dysmenorrhea [N94.6] 01/09/2013  . Depression [F32.9] 08/08/2012  . Fibromyalgia [M79.7] 08/08/2012    Past Medical History Past Medical History:  Diagnosis Date  . Anxiety   . Depression   . DVT (deep venous thrombosis) (HCC)    Right calf  . 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 Fibromyalgia    Questionable    Past Surgical History Past Surgical History:  Procedure Laterality Date  . COLONOSCOPY N/A 09/01/2015   Procedure: COLONOSCOPY;  Surgeon: West Bali, MD;  Location: [facility];  Service: Endoscopy;  Laterality: N/A;  9:45 AM  . ESOPHAGOGASTRODUODENOSCOPY N/A 06/04/2016   Procedure: ESOPHAGOGASTRODUODENOSCOPY (EGD);  Surgeon: [place] Bali, MD;  Location: [facility];  Service: Endoscopy;  Laterality: N/A;  10:30 am  . SAVORY DILATION N/A 06/04/2016   Procedure: SAVORY DILATION;  Surgeon: [place] Bali, MD;  Location: [facility];  Service: Endoscopy;  Laterality: N/A;  . SHOULDER SURGERY     Left  . SHOULDER SURGERY Left    x2 surgeries    Family History Family History   Problem Relation Age of Onset  . Cancer Father        Throat  . Hypertension Mother   . Breast cancer Mother   . Diabetes Maternal Grandmother      Social History  reports that she has never smoked. She has never used smokeless tobacco. She reports that she does not drink alcohol or use drugs.  Medications  Current Outpatient Medications:  .  albuterol (PROVENTIL HFA;VENTOLIN HFA) 108 (90 Base) MCG/ACT inhaler, Inhale 2 puffs into the lungs every 6 (six) hours as needed for wheezing or shortness of breath., Disp: 1 Inhaler, Rfl: 2 .  clobetasol cream (TEMOVATE) 0.05 %, APPLY 1 APPLICATION TOPICALLY 2 (TWO) TIMES DAILY., Disp: 30 g, Rfl: 2 .  escitalopram (LEXAPRO) 20 MG tablet, TAKE 1 TABLET BY MOUTH EVERY DAY, Disp: 90 tablet, Rfl: 0 .  fluticasone (FLONASE) 50 MCG/ACT nasal spray, PLACE 2 SPRAYS INTO THE NOSE DAILY., Disp: 16 g, Rfl: 2 .  GARLIC PO, Take 1 tablet by mouth daily., Disp: , Rfl:  .  hydrochlorothiazide (MICROZIDE) 12.5 MG capsule, TAKE 1 CAPSULE EVERY DAY, Disp: 90 capsule, Rfl:  1 .  Magnesium 250 MG TABS, Take 250 mg by mouth at bedtime. , Disp: , Rfl:  .  Milk Thistle 250 MG CAPS, Take 250 mg by mouth 2 (two) times daily. , Disp: , Rfl:  .  Multiple Vitamins-Minerals (WOMENS MULTI VITAMIN & MINERAL PO), Take 1 tablet by mouth daily., Disp: , Rfl:  .  Probiotic Product (PHILLIPS COLON HEALTH PO), Take 1 tablet by mouth daily., Disp: , Rfl:  .  rivaroxaban (XARELTO) 20 MG TABS tablet, Take 1 tablet (20 mg total) by mouth daily with supper., Disp: 30 tablet, Rfl: 1 .  traMADol (ULTRAM) 50 MG tablet, Take 1-2 tablets (50-100 mg total) by mouth every 12 (twelve) hours as needed for severe pain., Disp: 120 tablet, Rfl: 0 .  Turmeric 500 MG TABS, Take 500 capsules by mouth 3 (three) times daily. , Disp: , Rfl:  .  Vitamin D, Cholecalciferol, 1000 units CAPS, Take 1,000 Units by mouth daily. , Disp: , Rfl:   Allergies Penicillins  Review of Systems Review of Systems -  Oncology ROS negative   Physical Exam  Vitals Wt Readings from Last 3 Encounters:  02/13/18 172 lb 6.4 oz (78.2 kg)  01/17/18 171 lb 3.2 oz (77.7 kg)  12/14/17 173 lb 12.8 oz (78.8 kg)   Temp Readings from Last 3 Encounters:  02/13/18 97.9 F (36.6 C) (Oral)  01/17/18 99.1 F (37.3 C) (Oral)  12/14/17 98.2 F (36.8 C) (Oral)   BP Readings from Last 3 Encounters:  02/13/18 (!) 154/89  01/17/18 122/82  12/14/17 (!) 156/87   Pulse Readings from Last 3 Encounters:  02/13/18 79  01/17/18 75  12/14/17 76   Constitutional: Well-developed, well-nourished, and in no distress.   HENT: Head: Normocephalic and atraumatic.  Mouth/Throat: No oropharyngeal exudate. Mucosa moist. Eyes: Pupils are equal, round, and reactive to light. Conjunctivae are normal. No scleral icterus.  Neck: Normal range of motion. Neck supple. No JVD present.  Cardiovascular: Normal rate, regular rhythm and normal heart sounds.  Exam reveals no gallop and no friction rub.   No murmur heard. Pulmonary/Chest: Effort normal and breath sounds normal. No respiratory distress. No wheezes.No rales.  Abdominal: Soft. Bowel sounds are normal. No distension. There is no tenderness. There is no guarding.  Musculoskeletal: No edema or tenderness.  Lymphadenopathy: No cervical, axillary or supraclavicular adenopathy.  Neurological: Alert and oriented to person, place, and time. No cranial nerve deficit.  Skin: Skin is warm and dry. No rash noted. No erythema. No pallor.  Psychiatric: Affect and judgment normal.   Labs No visits with results within 3 Day(s) from this visit.  Latest known visit with results is:  Office Visit on 01/17/2018  Component Date Value Ref Range Status  . Summary 01/17/2018 FINAL   Final   Comment: ==================================================================== TOXASSURE SELECT 13 (MW) ==================================================================== Test                              Result       Flag       Units Drug Present and Declared for Prescription Verification   Tramadol                       >6250        EXPECTED   ng/mg creat   O-Desmethyltramadol            >6250        EXPECTED  ng/mg creat   N-Desmethyltramadol            1553         EXPECTED   ng/mg creat    Source of tramadol is a prescription medication.    O-desmethyltramadol and N-desmethyltramadol are expected    metabolites of tramadol. Drug Present not Declared for Prescription Verification   Carboxy-THC                    58           UNEXPECTED ng/mg creat    Carboxy-THC is a metabolite of tetrahydrocannabinol  (THC).    Source of [facility] is most commonly illicit, but THC is also present    in a scheduled prescription medication.   Norhydrocodone                 100          UNEXPECTED                           ng/mg creat    Norhydrocodone is an expected metabolite of hydrocodone. ==================================================================== Test                      Result    Flag   Units      Ref Range   Creatinine              80               mg/dL      >=82 ==================================================================== Declared Medications:  The flagging and interpretation on this report are based on the  following declared medications.  Unexpected results may arise from  inaccuracies in the declared medications.  **Note: The testing scope of this panel includes these medications:  Tramadol ==================================================================== For clinical consultation, please call [phone]. ====================================================================      Pathology Orders Placed This Encounter  Procedures  . CBC with Differential/Platelet    Standing Status:   Future    Standing Expiration Date:   02/14/2020  . Comprehensive metabolic panel    Standing Status:   Future    Standing Expiration Date:   02/14/2020  . Lactate dehydrogenase     Standing Status:   Future    Standing Expiration Date:   02/14/2020  . Protime-INR    Standing Status:   Future    Standing Expiration Date:   02/14/2020  . APTT    Standing Status:   Future    Standing Expiration Date:   02/14/2020  . Lupus anticoagulant panel    Standing Status:   Future    Standing Expiration Date:   02/14/2020       Ahmed Prima MD

## 2018-03-17 ENCOUNTER — Encounter: Payer: Self-pay | Admitting: Family

## 2018-03-17 ENCOUNTER — Ambulatory Visit: Payer: Federal, State, Local not specified - PPO | Admitting: Family

## 2018-03-17 ENCOUNTER — Ambulatory Visit (INDEPENDENT_AMBULATORY_CARE_PROVIDER_SITE_OTHER): Payer: Federal, State, Local not specified - PPO

## 2018-03-17 VITALS — BP 121/74 | HR 75 | Temp 96.7°F | Ht 67.0 in | Wt 175.4 lb

## 2018-03-17 DIAGNOSIS — M7989 Other specified soft tissue disorders: Secondary | ICD-10-CM

## 2018-03-17 DIAGNOSIS — M79644 Pain in right finger(s): Secondary | ICD-10-CM

## 2018-03-17 DIAGNOSIS — S63652A Sprain of metacarpophalangeal joint of right middle finger, initial encounter: Secondary | ICD-10-CM | POA: Diagnosis not present

## 2018-03-17 MED ORDER — PREDNISONE 10 MG (21) PO TBPK: ORAL_TABLET | ORAL | 0 refills | Status: DC

## 2018-03-17 NOTE — Progress Notes (Signed)
   Subjective:    Patient ID: Rita Barry, female    DOB: March 06, 1966, 52 y.o.   MRN: [id number]  Chief Complaint  Patient presents with  . right hand pain   Pt presents to the office today with right middle finger pain and swelling. She states she has had this pain for 2 months with no improvement. [place] and states she has tried to be "careful". She has tried cold, heat, rest, and naprosyn with no relief. She states when she moves or presses the finger her pain is a 10 out 10.   Denies any redness, warmth, or fevers.  Hand Pain   The incident occurred more than 1 week ago. There was no injury mechanism. Pain location: right middle finger. The quality of the pain is described as aching. The pain does not radiate. The pain is at a severity of 10/10 (when moving). The pain is moderate. The pain has been intermittent since the incident. Pertinent negatives include no numbness or tingling. The symptoms are aggravated by movement and lifting. She has tried rest and NSAIDs for the symptoms. The treatment provided mild relief.      Review of Systems  Neurological: Negative for tingling and numbness.  All other systems reviewed and are negative.      Objective:   Physical Exam  Constitutional: She is oriented to person, place, and time. She appears well-developed and well-nourished. No distress.  HENT:  Head: Normocephalic.  Cardiovascular: Normal rate, regular rhythm, normal heart sounds and intact distal pulses.  No murmur heard. Pulmonary/Chest: Effort normal and breath sounds normal. No respiratory distress. She has no wheezes.  Abdominal: Soft. Bowel sounds are normal. She exhibits no distension. There is no tenderness.  Musculoskeletal: She exhibits edema and tenderness.  Right middle finger swelling and unable to make fist. Pain with flexion  Neurological: She is alert and oriented to person, place, and time. She has normal reflexes. No cranial  nerve deficit.  Skin: Skin is warm and dry.  Psychiatric: She has a normal mood and affect. Her behavior is normal. Judgment and thought content normal.  Vitals reviewed.     BP 121/74   Pulse 75   Temp (!) 96.7 F (35.9 C) (Oral)   Ht 5\' 7"  (1.702 m)   Wt 175 lb 6.4 oz (79.6 kg)   LMP 04/16/2016 (Approximate)   BMI 27.47 kg/m      Assessment & Plan:  Rita Barry comes in today with chief complaint of right hand pain   Diagnosis and orders addressed:  1. Pain of finger of right hand - DG Finger Middle Right; Future  2. Swelling of right middle finger - DG Finger Middle Right; Future  3. Sprain of metacarpophalangeal (MCP) joint of right middle finger, initial encounter Rest Ice ROM exercises No NSAID's since she is on xarelto  If pain persists she may need MRI or referral to hand specialists  RTO in 4 weeks to recheck  - predniSONE (STERAPRED UNI-PAK 21 TAB) 10 MG (21) TBPK tablet; Use as directed  Dispense: 21 tablet; Refill: 0  Jannifer Rodneyhristy Sanav Remer, FNP

## 2018-03-17 NOTE — Patient Instructions (Signed)
Finger Sprain  A finger sprain is an injury to one of the strong bands of tissue (ligaments) that connect the bones in the finger. The ligament can be stretched too much, or it can tear. A tear can be either partial or complete. The severity of the sprain depends on how much of the ligament was damaged or torn.  CAUSES  This injury is often caused by a fall or an accident. For example, if you extend your hands to catch an object or to protect yourself during a fall, the force of impact may cause the ligaments in your finger to stretch too much.  RISK FACTORS  The following factors may make you more likely to have this injury:   Playing sports that involve a greater risk of falling, such as skiing.   Playing sports that involve catching an object, such as basketball.   Having poor strength and flexibility.  SYMPTOMS  Symptoms of this condition include:   Pain at the affected finger joint, especially when bending or extending the finger.   Loss of motion in the finger.   Swelling.   Tenderness.   Bruising.  DIAGNOSIS  This condition is diagnosed with a medical history and physical exam. You may also have an X-ray of your finger to rule out a fracture or dislocation.  TREATMENT  Treatment varies depending on the severity of the sprain. If your ligament is overstretched or partially torn, treatment usually involves:   Keeping the finger in a fixed position (immobilization) for a period of time. To help you do this, your health care provider may apply a bandage, splint, or cast to keep the finger from moving until it heals. In some cases, the finger may be taped to the fingers beside it (buddy taping).   Taking medicines for pain.   Doing exercises for the finger after it has begun to heal.  If your ligament is fully torn, you may need surgery to reconnect the ligament to the bone. After surgery, a cast or splint will be applied.  HOME CARE INSTRUCTIONS  If You Have a Splint:   Wear the splint as told by  your health care provider. Remove it only as told by your health care provider.   Loosen the splint if your fingers tingle, become numb, or turn cold and blue.   Do not let your splint get wet if it is not waterproof.   Keep the splint clean.  If You Have a Cast:   Do not stick anything inside the cast to scratch your skin. Doing that increases your risk of infection.   Check the skin around the cast every day. Tell your health care provider about any concerns.   You may put lotion on dry skin around the edges of the cast. Do not put lotion on the skin underneath the cast.   Do not let your cast get wet if it is not waterproof.   Keep the cast clean.  Bathing   If your splint or cast is not waterproof, cover it with a watertight plastic bag when you take a bath or a shower.   Keep any bandages (dressings) dry until your health care provider says they can be removed.  Managing Pain, Stiffness, and Swelling   If directed, put ice on the injured area:  ? Put ice in a plastic bag.  ? Place a towel between your skin and the bag.  ? Leave the ice on for 20 minutes, 2-3 times a day.     Move your fingers often to avoid stiffness and to lessen swelling.   Raise (elevate) the injured area above the level of your heart while you are sitting or lying down.  General Instructions   Do not put pressure on any part of the cast or splint until it is fully hardened. This may take several hours.   Take over-the-counter and prescription medicines only as told by your health care provider.   Do not drive or operate heavy machinery while taking prescription pain medicine.   Do exercises as told by your health care provider or physical therapist.   Do not wear rings on your injured finger.   Keep all follow-up visits as told by your health care provider. This is important.  SEEK MEDICAL CARE IF:   Your pain is not controlled with medicine.   Your bruising or swelling gets worse.   Your cast or splint is  damaged.   Your finger is numb or blue.   Your finger feels colder than normal.  This information is not intended to replace advice given to you by your health care provider. Make sure you discuss any questions you have with your health care provider.  Document Released: 05/27/2004 Document Revised: 08/11/2015 Document Reviewed: 02/27/2015  Elsevier Interactive Patient Education  2017 [employer]vier Inc[employer]

## 2018-03-27 ENCOUNTER — Other Ambulatory Visit: Payer: Self-pay | Admitting: Family

## 2018-04-06 ENCOUNTER — Encounter: Payer: Self-pay | Admitting: *Deleted

## 2018-04-15 ENCOUNTER — Other Ambulatory Visit: Payer: Self-pay | Admitting: Family

## 2018-04-15 DIAGNOSIS — I10 Essential (primary) hypertension: Secondary | ICD-10-CM

## 2018-04-17 DIAGNOSIS — K080 Exfoliation of teeth due to systemic causes: Secondary | ICD-10-CM | POA: Diagnosis not present

## 2018-04-18 ENCOUNTER — Encounter: Payer: Federal, State, Local not specified - PPO | Admitting: Family

## 2018-04-28 ENCOUNTER — Ambulatory Visit: Payer: Federal, State, Local not specified - PPO | Admitting: Family

## 2018-04-28 ENCOUNTER — Encounter: Payer: Self-pay | Admitting: Family

## 2018-04-28 VITALS — BP 128/80 | HR 87 | Temp 98.3°F | Ht 67.0 in | Wt 172.8 lb

## 2018-04-28 DIAGNOSIS — Z0289 Encounter for other administrative examinations: Secondary | ICD-10-CM

## 2018-04-28 DIAGNOSIS — F1120 Opioid dependence, uncomplicated: Secondary | ICD-10-CM | POA: Diagnosis not present

## 2018-04-28 DIAGNOSIS — M797 Fibromyalgia: Secondary | ICD-10-CM | POA: Diagnosis not present

## 2018-04-28 DIAGNOSIS — R6889 Other general symptoms and signs: Secondary | ICD-10-CM | POA: Diagnosis not present

## 2018-04-28 DIAGNOSIS — J101 Influenza due to other identified influenza virus with other respiratory manifestations: Secondary | ICD-10-CM

## 2018-04-28 DIAGNOSIS — F411 Generalized anxiety disorder: Secondary | ICD-10-CM

## 2018-04-28 LAB — VERITOR FLU A/B WAIVED
Influenza A: NEGATIVE
Influenza B: POSITIVE — AB

## 2018-04-28 MED ORDER — TRAMADOL HCL 50 MG PO TABS: 50.0000 mg | ORAL_TABLET | Freq: Two times a day (BID) | ORAL | 0 refills | Status: DC | PRN

## 2018-04-28 NOTE — Patient Instructions (Signed)

## 2018-04-28 NOTE — Progress Notes (Signed)
Subjective:    Patient ID: Rita PatellaHeather P Braun, female    DOB: 20-Mar-1966, 52 y.o.   MRN: [id number]  Chief Complaint  Patient presents with  . head congestion  . Headache  . Generalized Body Aches  . Fever    Fever   This is a new problem. The current episode started in the past 7 days. The problem occurs constantly. The problem has been rapidly worsening. Her temperature was unmeasured prior to arrival. Associated symptoms include chest pain, congestion, coughing, ear pain, headaches, muscle aches, sleepiness and a sore throat. Pertinent negatives include no urinary pain or vomiting. She has tried cool baths and acetaminophen for the symptoms. The treatment provided mild relief.  Fibromyalgia Pt has intermittent 5-6 out 10 in her back and generalized pain. She takes Ultram as needed and helps.    Review of Systems  Constitutional: Positive for fever.  HENT: Positive for congestion, ear pain and sore throat.   Respiratory: Positive for cough.   Cardiovascular: Positive for chest pain.  Gastrointestinal: Negative for vomiting.  Genitourinary: Negative for dysuria.  Neurological: Positive for headaches.  All other systems reviewed and are negative.      Objective:   Physical Exam Vitals signs reviewed.  Constitutional:      General: She is not in acute distress.    Appearance: She is well-developed.  HENT:     Head: Normocephalic and atraumatic.     Right Ear: External ear normal.  Eyes:     Pupils: Pupils are equal, round, and reactive to light.  Neck:     Musculoskeletal: Normal range of motion and neck supple.     Thyroid: No thyromegaly.  Cardiovascular:     Rate and Rhythm: Normal rate and regular rhythm.     Heart sounds: Normal heart sounds. No murmur.  Pulmonary:     Effort: Pulmonary effort is normal. No respiratory distress.     Breath sounds: Normal breath sounds. No wheezing.  Abdominal:     General: Bowel sounds are normal. There is no distension.   Palpations: Abdomen is soft.     Tenderness: There is no abdominal tenderness.  Musculoskeletal: Normal range of motion.        General: No tenderness.  Skin:    General: Skin is warm and dry.  Neurological:     Mental Status: She is alert and oriented to person, place, and time.     Cranial Nerves: No cranial nerve deficit.     Deep Tendon Reflexes: Reflexes are normal and symmetric.  Psychiatric:        Behavior: Behavior normal.        Thought Content: Thought content normal.        Judgment: Judgment normal.       BP 128/80   Pulse 87   Temp 98.3 F (36.8 C) (Oral)   Ht 5\' 7"  (1.702 m)   Wt 172 lb 12.8 oz (78.4 kg)   LMP 04/16/2016 (Approximate)   BMI 27.06 kg/m      Assessment & Plan:  Rita PatellaHeather P Sass comes in today with chief complaint of head congestion; Headache; Generalized Body Aches; and Fever   Diagnosis and orders addressed:  1. Flu-like symptoms - Veritor Flu A/B Waived  2. Pain medication agreement signed - traMADol (ULTRAM) 50 MG tablet; Take 1-2 tablets (50-100 mg total) by mouth every 12 (twelve) hours as needed for severe pain.  Dispense: 120 tablet; Refill: 0  3. Uncomplicated opioid dependence (HCC) -  traMADol (ULTRAM) 50 MG tablet; Take 1-2 tablets (50-100 mg total) by mouth every 12 (twelve) hours as needed for severe pain.  Dispense: 120 tablet; Refill: 0  4. Fibromyalgia - traMADol (ULTRAM) 50 MG tablet; Take 1-2 tablets (50-100 mg total) by mouth every 12 (twelve) hours as needed for severe pain.  Dispense: 120 tablet; Refill: 0  5. Influenza B Force fluids Rest Droplet precautions discussed Tylenol as needed for myalgia and fever   6. GAD (generalized anxiety disorder) - traMADol (ULTRAM) 50 MG tablet; Take 1-2 tablets (50-100 mg total) by mouth every 12 (twelve) hours as needed for severe pain.  Dispense: 120 tablet; Refill: 0    Jannifer Rodneyhristy Tyarra Nolton, FNP

## 2018-05-06 ENCOUNTER — Other Ambulatory Visit: Payer: Self-pay | Admitting: Family

## 2018-05-18 ENCOUNTER — Encounter: Payer: Federal, State, Local not specified - PPO | Admitting: Family

## 2018-05-28 ENCOUNTER — Other Ambulatory Visit (HOSPITAL_COMMUNITY): Payer: Self-pay | Admitting: Internal Medicine

## 2018-05-30 NOTE — Telephone Encounter (Signed)
I spoke with patient and she does not need this refill. She is wrapping up her 6 months of treatment this month, she only has about 1.5 weeks worth remaining.

## 2018-06-29 DIAGNOSIS — N6459 Other signs and symptoms in breast: Secondary | ICD-10-CM | POA: Diagnosis not present

## 2018-06-29 DIAGNOSIS — R921 Mammographic calcification found on diagnostic imaging of breast: Secondary | ICD-10-CM | POA: Diagnosis not present

## 2018-06-29 LAB — HM MAMMOGRAPHY

## 2018-07-10 ENCOUNTER — Other Ambulatory Visit: Payer: Self-pay

## 2018-07-10 ENCOUNTER — Inpatient Hospital Stay (HOSPITAL_COMMUNITY): Payer: Federal, State, Local not specified - PPO | Attending: Hematology

## 2018-07-10 DIAGNOSIS — Z832 Family history of diseases of the blood and blood-forming organs and certain disorders involving the immune mechanism: Secondary | ICD-10-CM | POA: Insufficient documentation

## 2018-07-10 DIAGNOSIS — I82491 Acute embolism and thrombosis of other specified deep vein of right lower extremity: Secondary | ICD-10-CM

## 2018-07-10 DIAGNOSIS — Z7901 Long term (current) use of anticoagulants: Secondary | ICD-10-CM | POA: Insufficient documentation

## 2018-07-10 DIAGNOSIS — I824Z1 Acute embolism and thrombosis of unspecified deep veins of right distal lower extremity: Secondary | ICD-10-CM | POA: Diagnosis not present

## 2018-07-10 LAB — CBC WITH DIFFERENTIAL/PLATELET
Abs Immature Granulocytes: 0.01 10*3/uL (ref 0.00–0.07)
Basophils Absolute: 0.1 10*3/uL (ref 0.0–0.1)
Basophils Relative: 1 %
Eosinophils Absolute: 0.7 10*3/uL — ABNORMAL HIGH (ref 0.0–0.5)
Eosinophils Relative: 14 %
HCT: 38.5 % (ref 36.0–46.0)
Hemoglobin: 12.2 g/dL (ref 12.0–15.0)
Immature Granulocytes: 0 %
Lymphocytes Relative: 23 %
Lymphs Abs: 1.2 10*3/uL (ref 0.7–4.0)
MCH: 31.4 pg (ref 26.0–34.0)
MCHC: 31.7 g/dL (ref 30.0–36.0)
MCV: 99.2 fL (ref 80.0–100.0)
Monocytes Absolute: 0.3 10*3/uL (ref 0.1–1.0)
Monocytes Relative: 6 %
Neutro Abs: 2.9 10*3/uL (ref 1.7–7.7)
Neutrophils Relative %: 56 %
Platelets: 384 10*3/uL (ref 150–400)
RBC: 3.88 MIL/uL (ref 3.87–5.11)
RDW: 13.1 % (ref 11.5–15.5)
WBC: 5.3 10*3/uL (ref 4.0–10.5)
nRBC: 0 % (ref 0.0–0.2)

## 2018-07-10 LAB — APTT: aPTT: 26 seconds (ref 24–36)

## 2018-07-10 LAB — COMPREHENSIVE METABOLIC PANEL
ALT: 25 U/L (ref 0–44)
AST: 32 U/L (ref 15–41)
Albumin: 4.3 g/dL (ref 3.5–5.0)
Alkaline Phosphatase: 22 U/L — ABNORMAL LOW (ref 38–126)
Anion gap: 10 (ref 5–15)
BUN: 26 mg/dL — ABNORMAL HIGH (ref 6–20)
CO2: 26 mmol/L (ref 22–32)
Calcium: 9.1 mg/dL (ref 8.9–10.3)
Chloride: 100 mmol/L (ref 98–111)
Creatinine, Ser: 0.61 mg/dL (ref 0.44–1.00)
GFR calc Af Amer: >60 mL/min (ref >60)
GFR calc non Af Amer: >60 mL/min (ref >60)
Glucose, Bld: 112 mg/dL — ABNORMAL HIGH (ref 70–99)
Potassium: 3.7 mmol/L (ref 3.5–5.1)
Sodium: 136 mmol/L (ref 135–145)
Total Bilirubin: 0.6 mg/dL (ref 0.3–1.2)
Total Protein: 7.4 g/dL (ref 6.5–8.1)

## 2018-07-10 LAB — LACTATE DEHYDROGENASE: LDH: 159 U/L (ref 98–192)

## 2018-07-10 LAB — PROTIME-INR
INR: 1 (ref 0.8–1.2)
Prothrombin Time: 13 seconds (ref 11.4–15.2)

## 2018-07-11 LAB — LUPUS ANTICOAGULANT PANEL
DRVVT: 34.1 s (ref 0.0–47.0)
PTT Lupus Anticoagulant: 29 s (ref 0.0–51.9)

## 2018-07-12 ENCOUNTER — Other Ambulatory Visit: Payer: Self-pay | Admitting: Family

## 2018-07-12 DIAGNOSIS — M797 Fibromyalgia: Secondary | ICD-10-CM

## 2018-07-12 DIAGNOSIS — F411 Generalized anxiety disorder: Secondary | ICD-10-CM

## 2018-07-12 DIAGNOSIS — F1120 Opioid dependence, uncomplicated: Secondary | ICD-10-CM

## 2018-07-12 DIAGNOSIS — Z0289 Encounter for other administrative examinations: Secondary | ICD-10-CM

## 2018-07-17 ENCOUNTER — Other Ambulatory Visit: Payer: Self-pay

## 2018-07-17 ENCOUNTER — Inpatient Hospital Stay (HOSPITAL_COMMUNITY): Payer: Federal, State, Local not specified - PPO | Admitting: Hematology

## 2018-07-17 ENCOUNTER — Encounter (HOSPITAL_COMMUNITY): Payer: Self-pay | Admitting: Hematology

## 2018-07-17 DIAGNOSIS — Z832 Family history of diseases of the blood and blood-forming organs and certain disorders involving the immune mechanism: Secondary | ICD-10-CM | POA: Diagnosis not present

## 2018-07-17 DIAGNOSIS — Z7901 Long term (current) use of anticoagulants: Secondary | ICD-10-CM

## 2018-07-17 DIAGNOSIS — I824Z9 Acute embolism and thrombosis of unspecified deep veins of unspecified distal lower extremity: Secondary | ICD-10-CM

## 2018-07-17 DIAGNOSIS — I824Z1 Acute embolism and thrombosis of unspecified deep veins of right distal lower extremity: Secondary | ICD-10-CM | POA: Diagnosis not present

## 2018-07-17 NOTE — Progress Notes (Signed)
[facility] [address], [place] [address]   CLINIC:  Medical Oncology/Hematology  PCP:  Junie Spencer, FNP [address] [place] [address] [phone]   REASON FOR VISIT:  Follow-up for Right lower extremity DVT    INTERVAL HISTORY:  Ms. Rita Barry 53 y.o. female returns for routine follow-up. She is here today by herself. She states that she is physically active at home. She states that she has been feeling great lately. She states that she has been off of the Xarelto for a month. She states that she is having night sweats.  Denies any nausea, vomiting, or diarrhea. Denies any new pains. Had not noticed any recent bleeding such as epistaxis, hematuria or hematochezia. Denies recent chest pain on exertion, shortness of breath on minimal exertion, pre-syncopal episodes, or palpitations. Denies any numbness or tingling in hands or feet. Denies any recent fevers, infections, or recent hospitalizations. Patient reports appetite at 100% and energy level at 100%.    REVIEW OF SYSTEMS:  Review of Systems  All other systems reviewed and are negative.    PAST MEDICAL/SURGICAL HISTORY:  Past Medical History:  Diagnosis Date  . Anxiety   . Depression   . DVT (deep venous thrombosis) (HCC)    Right calf  . 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 Fibromyalgia    Questionable   Past Surgical History:  Procedure Laterality Date  . COLONOSCOPY N/A 09/01/2015   Procedure: COLONOSCOPY;  Surgeon: West Bali, MD;  [facility];  Service: Endoscopy;  Laterality: N/A;  9:45 AM  . ESOPHAGOGASTRODUODENOSCOPY N/A 06/04/2016   Procedure: ESOPHAGOGASTRODUODENOSCOPY (EGD);  Surgeon: West Bali, MD;  Location: [facility];  Service: Endoscopy;  Laterality: N/A;  10:30 am  . SAVORY DILATION N/A 06/04/2016   Procedure: SAVORY DILATION;  Surgeon: West Bali, MD;  Location: [facility];  Service: Endoscopy;   Laterality: N/A;  . SHOULDER SURGERY     Left  . SHOULDER SURGERY Left    x2 surgeries     SOCIAL HISTORY:  Social History   Socioeconomic History  . Marital status: Married    Spouse name: Not on file  . Number of children: Not on file  . Years of education: Not on file  . Highest education level: Not on file  Occupational History  . Occupation: Works with Horses  Social Needs  . Financial resource strain: Not on file  . Food insecurity:    Worry: Not on file    Inability: Not on file  . Transportation needs:    Medical: Not on file    Non-medical: Not on file  Tobacco Use  . Smoking status: Never Smoker  . Smokeless tobacco: Never Used  Substance and Sexual Activity  . Alcohol use: No    Comment: quit drinking 3 weeks ago  . Drug use: No  . Sexual activity: Yes  Lifestyle  . Physical activity:    Days per week: Not on file    Minutes per session: Not on file  . Stress: Not on file  Relationships  . Social connections:    Talks on phone: Not on file    Gets together: Not on file    Attends religious service: Not on file    Active member of club or organization: Not on file    Attends meetings of clubs or organizations: Not on file  Relationship status: Not on file  . Intimate partner violence:    Fear of current or ex partner: Not on file    Emotionally abused: Not on file    Physically abused: Not on file    Forced sexual activity: Not on file  Other Topics Concern  . Not on file  Social History Narrative  . Not on file    FAMILY HISTORY:  Family History  Problem Relation Age of Onset  . Cancer Father        Throat  . Hypertension Mother   . Breast cancer Mother   . Diabetes Maternal Grandmother     CURRENT MEDICATIONS:  Outpatient Encounter Medications as of 07/17/2018  Medication Sig  . albuterol (PROVENTIL HFA;VENTOLIN HFA) 108 (90 Base) MCG/ACT inhaler Inhale 2 puffs into the lungs every 6 (six) hours as needed for wheezing or shortness  of breath.  . clobetasol cream (TEMOVATE) 0.05 % APPLY 1 APPLICATION TOPICALLY 2 (TWO) TIMES DAILY.  Marland Kitchen escitalopram (LEXAPRO) 20 MG tablet TAKE 1 TABLET BY MOUTH EVERY DAY  . fluticasone (FLONASE) 50 MCG/ACT nasal spray PLACE 2 SPRAYS INTO THE NOSE DAILY.  Marland Kitchen GARLIC PO Take 1 tablet by mouth daily.  . hydrochlorothiazide (MICROZIDE) 12.5 MG capsule TAKE 1 CAPSULE EVERY DAY  . Magnesium 250 MG TABS Take 250 mg by mouth at bedtime.   . Multiple Vitamins-Minerals (WOMENS MULTI VITAMIN & MINERAL PO) Take 1 tablet by mouth daily.  . Probiotic Product (PHILLIPS COLON HEALTH PO) Take 1 tablet by mouth daily.  . traMADol (ULTRAM) 50 MG tablet Take 1-2 tablets (50-100 mg total) by mouth every 12 (twelve) hours as needed for severe pain.  . Vitamin D, Cholecalciferol, 1000 units CAPS Take 1,000 Units by mouth daily.   . [DISCONTINUED] rivaroxaban (XARELTO) 20 MG TABS tablet Take 1 tablet (20 mg total) by mouth daily with supper.  . [DISCONTINUED] Turmeric 500 MG TABS Take 500 capsules by mouth 3 (three) times daily.    No facility-administered encounter medications on file as of 07/17/2018.     ALLERGIES:  Allergies  Allergen Reactions  . Penicillins Swelling    As a child. Has patient had a PCN reaction causing immediate rash, facial/tongue/throat swelling, SOB or lightheadedness with hypotension: Yes Has patient had a PCN reaction causing severe rash involving mucus membranes or skin necrosis: No Has patient had a PCN reaction that required hospitalization: No Has patient had a PCN reaction occurring within the last 10 years: No If all of the above answers are "NO", then may proceed with Cephalos     PHYSICAL EXAM:  ECOG Performance status: 0  Vitals:   07/17/18 1332  BP: (!) 144/80  Pulse: 92  Resp: 18  Temp: 98.1 F (36.7 C)  SpO2: 98%   Filed Weights   07/17/18 1332  Weight: 177 lb (80.3 kg)    Physical Exam Constitutional:      Appearance: Normal appearance.   Cardiovascular:     Rate and Rhythm: Normal rate and regular rhythm.  Pulmonary:     Effort: Pulmonary effort is normal.     Breath sounds: Normal breath sounds.  Musculoskeletal:        General: No swelling.  Skin:    General: Skin is warm.  Neurological:     General: No focal deficit present.     Mental Status: She is alert and oriented to person, place, and time.  Psychiatric:        Mood and Affect: Mood normal.  Behavior: Behavior normal.      LABORATORY DATA:  I have reviewed the labs as listed.  CBC    Component Value Date/Time   WBC 5.3 07/10/2018 1130   RBC 3.88 07/10/2018 1130   HGB 12.2 07/10/2018 1130   HGB 12.2 04/15/2017 1156   HCT 38.5 07/10/2018 1130   HCT 36.7 04/15/2017 1156   PLT 384 07/10/2018 1130   PLT 330 04/15/2017 1156   MCV 99.2 07/10/2018 1130   MCV 99 (H) 04/15/2017 1156   MCH 31.4 07/10/2018 1130   MCHC 31.7 07/10/2018 1130   RDW 13.1 07/10/2018 1130   RDW 13.2 04/15/2017 1156   LYMPHSABS 1.2 07/10/2018 1130   LYMPHSABS 1.4 04/15/2017 1156   MONOABS 0.3 07/10/2018 1130   EOSABS 0.7 (H) 07/10/2018 1130   EOSABS 0.6 (H) 04/15/2017 1156   BASOSABS 0.1 07/10/2018 1130   BASOSABS 0.1 04/15/2017 1156   CMP Latest Ref Rng & Units 07/10/2018 12/01/2017 11/22/2017  Glucose 70 - 99 mg/dL 397(Q) 98 86  BUN 6 - 20 mg/dL 73(A) 17 16  Creatinine 0.44 - 1.00 mg/dL 1.93 7.90 2.40  Sodium 135 - 145 mmol/L 136 137 139  Potassium 3.5 - 5.1 mmol/L 3.7 4.1 4.4  Chloride 98 - 111 mmol/L 100 102 97  CO2 22 - 32 mmol/L 26 28 27   Calcium 8.9 - 10.3 mg/dL 9.1 9.3 9.4  Total Protein 6.5 - 8.1 g/dL 7.4 7.2 -  Total Bilirubin 0.3 - 1.2 mg/dL 0.6 0.5 -  Alkaline Phos 38 - 126 U/L 22(L) 23(L) -  AST 15 - 41 U/L 32 30 -  ALT 0 - 44 U/L 25 35 -       DIAGNOSTIC IMAGING:  I have independently reviewed the scans and discussed with the patient.   I have reviewed Willy Eddy LPN's note and agree with the documentation.  I personally performed a  face-to-face visit, made revisions and my assessment and plan is as follows.    ASSESSMENT & PLAN:   DVT, lower extremity, distal (HCC) 1.  Provoked distal right DVT: - Patient was on OCP "Camila" for many decades. - Presentation with right leg cramps, ultrasound Doppler on 11/23/2017 shows right calf DVT in the paired gastrocnemius veins. - OCP was discontinued and patient was started on Xarelto. -Repeat Doppler on 02/09/2018 did not show any DVT. - CT CAP 12/13/2017 did not show any occult malignancy. - Initial lupus anticoagulant testing was positive while she was taking Xarelto.  Factor V Leiden and prothrombin gene mutation were negative.  Beta-2 glycoprotein 1 was negative. - She discontinued Xarelto in February 2020. -Repeat testing for lupus anticoagulant was negative.  Family history was consistent with DVT in her mother after surgery. - She was told that she will be slightly high risk for DVT because of her prior history.  Otherwise she can be watched off of anticoagulation.  She was told to continue to stay off of OCPs.  I will be glad to see her on an as-needed basis.      Orders placed this encounter:  No orders of the defined types were placed in this encounter.     Doreatha Massed, MD [facility] [phone]

## 2018-07-17 NOTE — Patient Instructions (Addendum)
[facility] at [facility] Discharge Instructions  You were seen today by Dr. Ellin Saba. He went over your recent results and everything looked good/normal. You can have massage therapy.   Thank you for choosing [facility] at [facility] to provide your oncology and hematology care.  To afford each patient quality time with our provider, please arrive at least 15 minutes before your scheduled appointment time.   If you have a lab appointment with the Cancer Center please come in thru the  Main Entrance and check in at the main information desk  You need to re-schedule your appointment should you arrive 10 or more minutes late.  We strive to give you quality time with our providers, and arriving late affects you and other patients whose appointments are after yours.  Also, if you no show three or more times for appointments you may be dismissed from the clinic at the providers discretion.     Again, thank you for choosing [facility].  Our hope is that these requests will decrease the amount of time that you wait before being seen by our physicians.       _____________________________________________________________  Should you have questions after your visit to [facility], please contact our office at [phone] between the hours of 8:00 a.m. and 4:30 p.m.  Voicemails left after 4:00 p.m. will not be returned until the following business day.  For prescription refill requests, have your pharmacy contact our office and allow 72 hours.    Cancer Center Support Programs:   > Cancer Support Group  2nd Tuesday of the month 1pm-2pm, Journey Room

## 2018-07-17 NOTE — Assessment & Plan Note (Addendum)
1.  Provoked distal right DVT: - Patient was on OCP "Camila" for many decades. - Presentation with right leg cramps, ultrasound Doppler on 11/23/2017 shows right calf DVT in the paired gastrocnemius veins. - OCP was discontinued and patient was started on Xarelto. -Repeat Doppler on 02/09/2018 did not show any DVT. - CT CAP 12/13/2017 did not show any occult malignancy. - Initial lupus anticoagulant testing was positive while she was taking Xarelto.  Factor V Leiden and prothrombin gene mutation were negative.  Beta-2 glycoprotein 1 was negative. - She discontinued Xarelto in February 2020. -Repeat testing for lupus anticoagulant was negative.  Family history was consistent with DVT in her mother after surgery. - She was told that she will be slightly high risk for DVT because of her prior history.  Otherwise she can be watched off of anticoagulation.  She was told to continue to stay off of OCPs.  I will be glad to see her on an as-needed basis.

## 2018-07-21 ENCOUNTER — Other Ambulatory Visit: Payer: Self-pay

## 2018-07-21 ENCOUNTER — Ambulatory Visit: Payer: Federal, State, Local not specified - PPO | Admitting: Family

## 2018-07-21 ENCOUNTER — Encounter: Payer: Self-pay | Admitting: Family

## 2018-07-21 VITALS — BP 125/80 | HR 84 | Temp 99.1°F | Ht 67.0 in | Wt 175.8 lb

## 2018-07-21 DIAGNOSIS — E785 Hyperlipidemia, unspecified: Secondary | ICD-10-CM

## 2018-07-21 DIAGNOSIS — Z0289 Encounter for other administrative examinations: Secondary | ICD-10-CM

## 2018-07-21 DIAGNOSIS — F411 Generalized anxiety disorder: Secondary | ICD-10-CM | POA: Diagnosis not present

## 2018-07-21 DIAGNOSIS — M797 Fibromyalgia: Secondary | ICD-10-CM | POA: Diagnosis not present

## 2018-07-21 DIAGNOSIS — F1120 Opioid dependence, uncomplicated: Secondary | ICD-10-CM

## 2018-07-21 DIAGNOSIS — I10 Essential (primary) hypertension: Secondary | ICD-10-CM

## 2018-07-21 DIAGNOSIS — F331 Major depressive disorder, recurrent, moderate: Secondary | ICD-10-CM

## 2018-07-21 MED ORDER — TRAMADOL HCL 50 MG PO TABS: 50.0000 mg | ORAL_TABLET | Freq: Two times a day (BID) | ORAL | 2 refills | Status: DC | PRN

## 2018-07-21 NOTE — Progress Notes (Addendum)
Subjective:    Patient ID: Rita Barry, female    DOB: 02/02/1966, 53 y.o.   MRN: [id number]  Chief Complaint  Patient presents with  . pain management   PT presents to the office today for chronic follow up and pain mediation. Hypertension  This is a chronic problem. The current episode started more than 1 year ago. The problem has been resolved since onset. The problem is controlled. Associated symptoms include anxiety. Pertinent negatives include no headaches, peripheral edema or shortness of breath. Risk factors for coronary artery disease include dyslipidemia and obesity. The current treatment provides moderate improvement. There is no history of kidney disease, CAD/MI, CVA or heart failure.  Hyperlipidemia  This is a chronic problem. The current episode started more than 1 year ago. The problem is uncontrolled. Recent lipid tests were reviewed and are high. Pertinent negatives include no shortness of breath. Current antihyperlipidemic treatment includes statins. The current treatment provides moderate improvement of lipids. Risk factors for coronary artery disease include dyslipidemia, hypertension and a sedentary lifestyle.  Anxiety  Presents for follow-up visit. Symptoms include depressed mood, excessive worry, irritability and nervous/anxious behavior. Patient reports no shortness of breath. Symptoms occur most days. The severity of symptoms is moderate.    Depression         This is a chronic problem.  The current episode started more than 1 year ago.   The onset quality is gradual.   The problem occurs intermittently.  Associated symptoms include irritable.  Associated symptoms include no helplessness, no hopelessness and no headaches.  Past treatments include SSRIs - Selective serotonin reuptake inhibitors.  Past medical history includes anxiety.   Fibromyalgia Takes Ultram as needed.     Review of Systems  Constitutional: Positive for irritability.  Respiratory: Negative  for shortness of breath.   Neurological: Negative for headaches.  Psychiatric/Behavioral: Positive for depression. The patient is nervous/anxious.   All other systems reviewed and are negative.      Objective:   Physical Exam Vitals signs reviewed.  Constitutional:      General: Rita Barry is irritable. Rita Barry is not in acute distress.    Appearance: Rita Barry is well-developed.  HENT:     Head: Normocephalic and atraumatic.     Right Ear: Tympanic membrane normal.     Left Ear: Tympanic membrane normal.  Eyes:     Pupils: Pupils are equal, round, and reactive to light.  Neck:     Musculoskeletal: Normal range of motion and neck supple.     Thyroid: No thyromegaly.  Cardiovascular:     Rate and Rhythm: Normal rate and regular rhythm.     Heart sounds: Normal heart sounds. No murmur.  Pulmonary:     Effort: Pulmonary effort is normal. No respiratory distress.     Breath sounds: Normal breath sounds. No wheezing.  Abdominal:     General: Bowel sounds are normal. There is no distension.     Palpations: Abdomen is soft.     Tenderness: There is no abdominal tenderness.  Musculoskeletal: Normal range of motion.        General: No tenderness.  Skin:    General: Skin is warm and dry.  Neurological:     Mental Status: Rita Barry is alert and oriented to person, place, and time.     Cranial Nerves: No cranial nerve deficit.     Deep Tendon Reflexes: Reflexes are normal and symmetric.  Psychiatric:        Behavior: Behavior normal.  Thought Content: Thought content normal.        Judgment: Judgment normal.     BP 125/80   Pulse 84   Temp 99.1 F (37.3 C) (Oral)   Ht 5\' 7"  (1.702 m)   Wt 175 lb 12.8 oz (79.7 kg)   LMP 04/16/2016 (Approximate)   BMI 27.53 kg/m      Assessment & Plan:  PINKEY KILLEEN comes in today with chief complaint of pain management   Diagnosis and orders addressed:  1. Essential hypertension  2. Moderate episode of recurrent major depressive disorder (HCC)   3. Fibromyalgia - traMADol (ULTRAM) 50 MG tablet; Take 1-2 tablets (50-100 mg total) by mouth every 12 (twelve) hours as needed for severe pain.  Dispense: 60 tablet; Refill: 2 - ToxASSURE Select 13 (MW), Urine  4. GAD (generalized anxiety disorder) - traMADol (ULTRAM) 50 MG tablet; Take 1-2 tablets (50-100 mg total) by mouth every 12 (twelve) hours as needed for severe pain.  Dispense: 60 tablet; Refill: 2 - ToxASSURE Select 13 (MW), Urine  5. Hyperlipidemia, unspecified hyperlipidemia type  6. Uncomplicated opioid dependence (HCC) - traMADol (ULTRAM) 50 MG tablet; Take 1-2 tablets (50-100 mg total) by mouth every 12 (twelve) hours as needed for severe pain.  Dispense: 60 tablet; Refill: 2 - ToxASSURE Select 13 (MW), Urine  7. Pain medication agreement signed - traMADol (ULTRAM) 50 MG tablet; Take 1-2 tablets (50-100 mg total) by mouth every 12 (twelve) hours as needed for severe pain.  Dispense: 60 tablet; Refill: 2 - ToxASSURE Select 13 (MW), Urine   Labs pending PT reviewed in [place] controlled database- No red flags noted Health Maintenance reviewed Diet and exercise encouraged  Follow up plan: 3 months    [name], FNP

## 2018-07-21 NOTE — Patient Instructions (Signed)

## 2018-07-25 LAB — TOXASSURE SELECT 13 (MW), URINE

## 2018-07-31 ENCOUNTER — Other Ambulatory Visit: Payer: Self-pay | Admitting: Family

## 2018-10-12 ENCOUNTER — Other Ambulatory Visit: Payer: Self-pay | Admitting: Family

## 2018-10-12 DIAGNOSIS — I10 Essential (primary) hypertension: Secondary | ICD-10-CM

## 2018-10-19 ENCOUNTER — Other Ambulatory Visit: Payer: Self-pay

## 2018-10-20 ENCOUNTER — Ambulatory Visit (INDEPENDENT_AMBULATORY_CARE_PROVIDER_SITE_OTHER): Payer: Federal, State, Local not specified - PPO | Admitting: Family

## 2018-10-20 ENCOUNTER — Encounter: Payer: Self-pay | Admitting: Family

## 2018-10-20 DIAGNOSIS — M797 Fibromyalgia: Secondary | ICD-10-CM | POA: Diagnosis not present

## 2018-10-20 DIAGNOSIS — M79644 Pain in right finger(s): Secondary | ICD-10-CM | POA: Diagnosis not present

## 2018-10-20 DIAGNOSIS — F411 Generalized anxiety disorder: Secondary | ICD-10-CM

## 2018-10-20 DIAGNOSIS — Z0289 Encounter for other administrative examinations: Secondary | ICD-10-CM

## 2018-10-20 DIAGNOSIS — I10 Essential (primary) hypertension: Secondary | ICD-10-CM

## 2018-10-20 DIAGNOSIS — F1120 Opioid dependence, uncomplicated: Secondary | ICD-10-CM | POA: Diagnosis not present

## 2018-10-20 DIAGNOSIS — E785 Hyperlipidemia, unspecified: Secondary | ICD-10-CM

## 2018-10-20 DIAGNOSIS — F331 Major depressive disorder, recurrent, moderate: Secondary | ICD-10-CM

## 2018-10-20 MED ORDER — TRAMADOL HCL 50 MG PO TABS: 50.0000 mg | ORAL_TABLET | Freq: Two times a day (BID) | ORAL | 2 refills | Status: DC | PRN

## 2018-10-20 NOTE — Progress Notes (Signed)
Virtual Visit via telephone Note  I connected with Rita PatellaHeather P Barry on 10/20/18 at 11:16 AM by telephone and verified that I am speaking with the correct person using two identifiers. Rita Barry is currently located at home and husband is currently with her during visit. The provider, Jannifer Rodneyhristy Julie-Anne Torain, FNP is located in their office at time of visit.  I discussed the limitations, risks, security and privacy concerns of performing an evaluation and management service by telephone and the availability of in person appointments. I also discussed with the patient that there may be a patient responsible charge related to this service. The patient expressed understanding and agreed to proceed.   History and Present Illness:  PT presents to the office today for chronic follow up and pain mediation.  Hypertension This is a chronic problem. The current episode started more than 1 year ago. The problem has been resolved since onset. The problem is controlled. Associated symptoms include anxiety. Pertinent negatives include no malaise/fatigue, peripheral edema or shortness of breath. Risk factors for coronary artery disease include dyslipidemia and obesity. The current treatment provides moderate improvement. There is no history of CAD/MI, CVA or heart failure.  Hyperlipidemia This is a chronic problem. The current episode started more than 1 year ago. The problem is uncontrolled. Recent lipid tests were reviewed and are high. Pertinent negatives include no shortness of breath. Current antihyperlipidemic treatment includes diet change. The current treatment provides mild improvement of lipids. Risk factors for coronary artery disease include dyslipidemia and hypertension.  Anxiety Presents for follow-up visit. Symptoms include depressed mood, excessive worry, irritability and restlessness. Patient reports no decreased concentration or shortness of breath. Symptoms occur most days. The severity of symptoms  is moderate.    Depression        This is a chronic problem.  The current episode started more than 1 year ago.   The onset quality is gradual.   The problem occurs intermittently.  The problem has been waxing and waning since onset.  Associated symptoms include irritable, restlessness and sad.  Associated symptoms include no decreased concentration, no helplessness and no hopelessness.  Past treatments include SSRIs - Selective serotonin reuptake inhibitors.  Past medical history includes anxiety.   Back Pain This is a chronic problem. The current episode started more than 1 year ago. The problem occurs intermittently. The pain is present in the lumbar spine. The quality of the pain is described as aching. The pain is at a severity of 5/10. The pain is moderate. Pertinent negatives include no numbness or tingling. She has tried ice and analgesics for the symptoms. The treatment provided mild relief.  Hand Pain  The incident occurred more than 1 week ago. The pain is present in the right fingers. The quality of the pain is described as aching. The pain is at a severity of 10/10. The pain is moderate. The pain has been intermittent since the incident. Pertinent negatives include no numbness or tingling. She has tried rest and NSAIDs for the symptoms. The treatment provided mild relief.    Current opioids rx- Ultram 50 mg every 12 hours as needed # meds rx- 60 Effectiveness of current meds-Stable Adverse reactions form pain meds-none Morphine equivalent- 0  Pill count performed-No Last drug screen - 07/21/18 ( high risk q4158m, moderate risk q6472m, low risk yearly ) Urine drug screen today- No Was the NCCSR reviewed- Yes  If yes were their any concerning findings? - none  No flowsheet data found.  Pain contract signed on:07/21/18   Review of Systems  Constitutional: Positive for irritability. Negative for malaise/fatigue.  Respiratory: Negative for shortness of breath.   Musculoskeletal:  Positive for back pain.  Neurological: Negative for tingling and numbness.  Psychiatric/Behavioral: Positive for depression. Negative for decreased concentration.     Observations/Objective: No SOB or distress noted  Assessment and Plan: Rita Barry comes in today with chief complaint of No chief complaint on file.   Diagnosis and orders addressed:  1. Fibromyalgia - traMADol (ULTRAM) 50 MG tablet; Take 1-2 tablets (50-100 mg total) by mouth every 12 (twelve) hours as needed for severe pain.  Dispense: 60 tablet; Refill: 2  2. Pain medication agreement signed - traMADol (ULTRAM) 50 MG tablet; Take 1-2 tablets (50-100 mg total) by mouth every 12 (twelve) hours as needed for severe pain.  Dispense: 60 tablet; Refill: 2  3. Uncomplicated opioid dependence (HCC) - traMADol (ULTRAM) 50 MG tablet; Take 1-2 tablets (50-100 mg total) by mouth every 12 (twelve) hours as needed for severe pain.  Dispense: 60 tablet; Refill: 2  4. GAD (generalized anxiety disorder) - traMADol (ULTRAM) 50 MG tablet; Take 1-2 tablets (50-100 mg total) by mouth every 12 (twelve) hours as needed for severe pain.  Dispense: 60 tablet; Refill: 2  5. Pain of right middle finger - Ambulatory referral to Hand Surgery  6. Essential hypertension  7. Hyperlipidemia, unspecified hyperlipidemia type  8. Moderate episode of recurrent major depressive disorder ([place])   PT reviewed in [place] controlled database- No red flags Health Maintenance reviewed Diet and exercise encouraged  Follow up plan: 3 months     I discussed the assessment and treatment plan with the patient. The patient was provided an opportunity to ask questions and all were answered. The patient agreed with the plan and demonstrated an understanding of the instructions.   The patient was advised to call back or seek an in-person evaluation if the symptoms worsen or if the condition fails to improve as anticipated.  The above assessment and  management plan was discussed with the patient. The patient verbalized understanding of and has agreed to the management plan. Patient is aware to call the clinic if symptoms persist or worsen. Patient is aware when to return to the clinic for a follow-up visit. Patient educated on when it is appropriate to go to the emergency department.   Time call ended:  11:31 AM  I provided 15 minutes of non-face-to-face time during this encounter.    [name], FNP '

## 2018-10-26 ENCOUNTER — Other Ambulatory Visit: Payer: Self-pay

## 2018-10-26 ENCOUNTER — Encounter: Payer: Self-pay | Admitting: Orthopaedic Surgery

## 2018-10-26 ENCOUNTER — Other Ambulatory Visit: Payer: Self-pay | Admitting: Family

## 2018-10-26 ENCOUNTER — Ambulatory Visit (INDEPENDENT_AMBULATORY_CARE_PROVIDER_SITE_OTHER): Payer: Federal, State, Local not specified - PPO | Admitting: Orthopaedic Surgery

## 2018-10-26 VITALS — Ht 67.5 in | Wt 165.0 lb

## 2018-10-26 DIAGNOSIS — M65331 Trigger finger, right middle finger: Secondary | ICD-10-CM

## 2018-10-26 NOTE — Progress Notes (Signed)
Office Visit Note   Patient: Rita Barry           Date of Birth: [birth year]           MRN: [id number] Visit Date: 10/26/2018              Requested by: Junie SpencerHawks, Christy A, FNP [address] [place],  [place] [id number] PCP: Junie SpencerHawks, Christy A, FNP   Assessment & Plan: Visit Diagnoses:  1. Trigger middle finger of right hand   2. Trigger finger, right middle finger     Plan: Right long trigger finger injection done at the A1 pulley with immediate pain relief.  We applied a dorsal splint over the DIP joint she can use for a week or 2.  She has small callus over the area of the Dupuytren's but is not particularly painful.  She will return if she has persistent problems.  Pathophysiology discussed with patient.  And x-rays were negative and reviewed with patient.  Follow-Up Instructions: Return if symptoms worsen or fail to improve.   Orders:  Orders Placed This Encounter  Procedures  . Hand/UE Inj: R long A1   No orders of the defined types were placed in this encounter.     Procedures: Hand/UE Inj: R long A1 for trigger finger on 10/26/2018 2:27 PM Medications: 0.3 mL lidocaine 1 %; 0.33 mL bupivacaine 0.25 %; 13.33 mg methylPREDNISolone acetate 40 MG/ML      Clinical Data: No additional findings.   Subjective: Chief Complaint  Patient presents with  . Right Hand - Pain    HPI 53 year old female with right long finger pain onset September 2019 while pulling weeds.  She had x-rays which are negative for fracture.  She is noticed some swelling as well as prominence with a callus proximal to the A1 pulley on the right hand.  She is noted locking of her finger and has to manually extend the faint finger.  She is used Naprosyn and heating pad without improvement.  She does some massaging of horses and this aggravates her symptoms.  No locking of other digits.  Patient is right-hand dominant.  Review of Systems 14 point review of systems positive for hypertension,  depression, fibromyalgia.  Opioid dependence on chronic tramadol.  History of erosive gastritis, dysphasia.  Hyperlipidemia.  Otherwise negative as it pertains HPI.  Negative for neck pain.   Objective: Vital Signs: Ht 5' 7.5" (1.715 m)   Wt 165 lb (74.8 kg)   LMP 04/16/2016 (Approximate)   BMI 25.46 kg/m   Physical Exam Constitutional:      Appearance: She is well-developed.  HENT:     Head: Normocephalic.     Right Ear: External ear normal.     Left Ear: External ear normal.  Eyes:     Pupils: Pupils are equal, round, and reactive to light.  Neck:     Thyroid: No thyromegaly.     Trachea: No tracheal deviation.  Cardiovascular:     Rate and Rhythm: Normal rate.  Pulmonary:     Effort: Pulmonary effort is normal.  Abdominal:     Palpations: Abdomen is soft.  Skin:    General: Skin is warm and dry.  Neurological:     Mental Status: She is alert and oriented to person, place, and time.  Psychiatric:        Behavior: Behavior normal.     Ortho Exam patient has an area of Dupuytren's proximal to the A1 pulley over the right  long finger flexor tendon.  Tenderness prominence and triggering at the A1 pulley with significant pain with palpation.  No Dupuytren's in the remaining area of the hand thumb and none on the opposite left hand.  Good cervical range of motion full elbow extension.  Flexor extensor and interossei are intact other than triggering of the right long finger.  Specialty Comments:  No specialty comments available.  Imaging: No results found.   PMFS History: Patient Active Problem List   Diagnosis Date Noted  . DVT, lower extremity, distal ([place]) 07/17/2018  . Esophagitis, eosinophilic 20/25/4270  . Erosive gastritis   . Lymphadenopathy, cervical 02/21/2016  . Esophageal dysphagia 02/21/2016  . Essential hypertension 11/26/2015  . Special screening for malignant neoplasms, colon   . Allergic rhinitis 08/25/2015  . Pain medication agreement signed  08/01/2015  . Opioid dependence ([place]) 08/01/2015  . Vitamin D deficiency 08/23/2014  . Hyperlipidemia 08/23/2014  . GAD (generalized anxiety disorder) 09/18/2013  . Benign paroxysmal positional vertigo 03/02/2013  . General counseling for prescription of oral contraceptives 01/09/2013  . Dysmenorrhea 01/09/2013  . Depression 08/08/2012  . Fibromyalgia 08/08/2012   Past Medical History:  Diagnosis Date  . Anxiety   . Depression   . DVT (deep venous thrombosis) (HCC)    Right calf  . Esophagitis, eosinophilic 10/24/7626   EGD FEB 2018 35 CM 22 HPF, 20 CM 23 HPF  . Fibromyalgia    Questionable    Family History  Problem Relation Age of Onset  . Cancer Father        Throat  . Hypertension Mother   . Breast cancer Mother   . Diabetes Maternal Grandmother     Past Surgical History:  Procedure Laterality Date  . COLONOSCOPY N/A 09/01/2015   Procedure: COLONOSCOPY;  Surgeon: Danie Binder, MD;  Location[facility];  Service: Endoscopy;  Laterality: N/A;  9:45 AM  . ESOPHAGOGASTRODUODENOSCOPY N/A 06/04/2016   Procedure: ESOPHAGOGASTRODUODENOSCOPY (EGD);  Surgeon: Danie Binder, MD;  Location: [facility];  Service: Endoscopy;  Laterality: N/A;  10:30 am  . SAVORY DILATION N/A 06/04/2016   Procedure: SAVORY DILATION;  Surgeon: Danie Binder, MD;  Location: [facility];  Service: Endoscopy;  Laterality: N/A;  . SHOULDER SURGERY     Left  . SHOULDER SURGERY Left    x2 surgeries   Social History   Occupational History  . Occupation: Works with [employer]  Tobacco Use  . Smoking status: Never Smoker  . Smokeless tobacco: Never Used  Substance and Sexual Activity  . Alcohol use: No    Comment: quit drinking 3 weeks ago  . Drug use: No  . Sexual activity: Yes

## 2018-10-28 MED ORDER — BUPIVACAINE HCL 0.25 % IJ SOLN
0.3300 mL | INTRAMUSCULAR | Status: AC | PRN
  Administered 2018-10-26: .33 mL

## 2018-10-28 MED ORDER — METHYLPREDNISOLONE ACETATE 40 MG/ML IJ SUSP
13.3300 mg | INTRAMUSCULAR | Status: AC | PRN
  Administered 2018-10-26: 13.33 mg

## 2018-10-28 MED ORDER — LIDOCAINE HCL 1 % IJ SOLN
0.3000 mL | INTRAMUSCULAR | Status: AC | PRN
  Administered 2018-10-26: .3 mL

## 2018-11-13 ENCOUNTER — Telehealth (INDEPENDENT_AMBULATORY_CARE_PROVIDER_SITE_OTHER): Payer: Federal, State, Local not specified - PPO | Admitting: Family

## 2018-11-13 ENCOUNTER — Telehealth: Payer: Self-pay | Admitting: *Deleted

## 2018-11-13 ENCOUNTER — Other Ambulatory Visit: Payer: Federal, State, Local not specified - PPO

## 2018-11-13 ENCOUNTER — Encounter: Payer: Self-pay | Admitting: Family

## 2018-11-13 DIAGNOSIS — M791 Myalgia, unspecified site: Secondary | ICD-10-CM

## 2018-11-13 DIAGNOSIS — Z20822 Contact with and (suspected) exposure to covid-19: Secondary | ICD-10-CM

## 2018-11-13 DIAGNOSIS — J029 Acute pharyngitis, unspecified: Secondary | ICD-10-CM | POA: Diagnosis not present

## 2018-11-13 DIAGNOSIS — R6889 Other general symptoms and signs: Secondary | ICD-10-CM | POA: Diagnosis not present

## 2018-11-13 DIAGNOSIS — R1031 Right lower quadrant pain: Secondary | ICD-10-CM | POA: Diagnosis not present

## 2018-11-13 NOTE — Progress Notes (Signed)
   Virtual Visit via telephone Note  I connected with Rita Barry on 11/13/18 at 1:46 pm by telephone and verified that I am speaking with the correct person using two identifiers. Rita Barry is currently located at home  and no one is currently with her during visit. The provider, [name], FNP is located in their office at time of visit.  I discussed the limitations, risks, security and privacy concerns of performing an evaluation and management service by telephone and the availability of in person appointments. I also discussed with the patient that there may be a patient responsible charge related to this service. The patient expressed understanding and agreed to proceed.   History and Present Illness:  Sore Throat  This is a new problem. The current episode started 1 to 4 weeks ago. The problem has been waxing and waning. There has been no fever. The pain is at a severity of 3/10. The pain is moderate. Associated symptoms include abdominal pain, swollen glands and trouble swallowing. Pertinent negatives include no congestion, coughing, diarrhea, ear discharge, ear pain, headaches or vomiting. She has had no exposure to strep. She has tried acetaminophen for the symptoms. The treatment provided mild relief.  Abdominal Pain This is a new problem. The current episode started 1 to 4 weeks ago. The onset quality is gradual. The problem occurs constantly. The problem has been unchanged. The pain is located in the RUQ. The pain is at a severity of 5/10. The pain is moderate. The abdominal pain does not radiate. Associated symptoms include dysuria. Pertinent negatives include no belching, constipation, diarrhea, fever, frequency, headaches, hematuria, nausea or vomiting. She has tried acetaminophen for the symptoms. The treatment provided mild relief.      Review of Systems  Constitutional: Negative for fever.  HENT: Positive for trouble swallowing. Negative for congestion, ear  discharge and ear pain.   Respiratory: Negative for cough.   Gastrointestinal: Positive for abdominal pain. Negative for constipation, diarrhea, nausea and vomiting.  Genitourinary: Positive for dysuria. Negative for frequency and hematuria.  Neurological: Negative for headaches.  All other systems reviewed and are negative.    Observations/Objective: No SOB or distress  noted  Assessment and Plan: Rita Barry comes in today with chief complaint of No chief complaint on file.   Diagnosis and orders addressed:  1. Sore throat  2. Myalgia  3. Right lower quadrant abdominal pain   Will do referral for COVID symptoms  Tylenol as needed Continue flonase Start Zyrtec Self isolate until test results return       I discussed the assessment and treatment plan with the patient. The patient was provided an opportunity to ask questions and all were answered. The patient agreed with the plan and demonstrated an understanding of the instructions.   The patient was advised to call back or seek an in-person evaluation if the symptoms worsen or if the condition fails to improve as anticipated.  The above assessment and management plan was discussed with the patient. The patient verbalized understanding of and has agreed to the management plan. Patient is aware to call the clinic if symptoms persist or worsen. Patient is aware when to return to the clinic for a follow-up visit. Patient educated on when it is appropriate to go to the emergency department.   Time call ended:  2:07 pm  I provided 21 minutes of non-face-to-face time during this encounter.    [name], FNP

## 2018-11-13 NOTE — Telephone Encounter (Signed)
Ordering Provider: Almond Lint  Will do referral for COVID symptoms  Tylenol as needed Continue flonase Start Zyrtec Self isolate until test results return  Call to patient- patient has been scheduled and orders placed

## 2018-11-17 LAB — NOVEL CORONAVIRUS, NAA: SARS-CoV-2, NAA: NOT DETECTED

## 2019-01-07 ENCOUNTER — Other Ambulatory Visit: Payer: Self-pay | Admitting: Family

## 2019-01-07 DIAGNOSIS — I10 Essential (primary) hypertension: Secondary | ICD-10-CM

## 2019-01-11 ENCOUNTER — Other Ambulatory Visit: Payer: Self-pay | Admitting: Family

## 2019-01-19 ENCOUNTER — Ambulatory Visit (INDEPENDENT_AMBULATORY_CARE_PROVIDER_SITE_OTHER): Payer: Federal, State, Local not specified - PPO | Admitting: Family

## 2019-01-19 ENCOUNTER — Encounter: Payer: Self-pay | Admitting: Family

## 2019-01-19 DIAGNOSIS — F411 Generalized anxiety disorder: Secondary | ICD-10-CM | POA: Diagnosis not present

## 2019-01-19 DIAGNOSIS — E785 Hyperlipidemia, unspecified: Secondary | ICD-10-CM

## 2019-01-19 DIAGNOSIS — Z0289 Encounter for other administrative examinations: Secondary | ICD-10-CM

## 2019-01-19 DIAGNOSIS — F1120 Opioid dependence, uncomplicated: Secondary | ICD-10-CM | POA: Diagnosis not present

## 2019-01-19 DIAGNOSIS — M797 Fibromyalgia: Secondary | ICD-10-CM

## 2019-01-19 DIAGNOSIS — F331 Major depressive disorder, recurrent, moderate: Secondary | ICD-10-CM

## 2019-01-19 DIAGNOSIS — I10 Essential (primary) hypertension: Secondary | ICD-10-CM | POA: Diagnosis not present

## 2019-01-19 MED ORDER — TRAMADOL HCL 50 MG PO TABS: 50.0000 mg | ORAL_TABLET | Freq: Two times a day (BID) | ORAL | 2 refills | Status: DC | PRN

## 2019-01-19 NOTE — Progress Notes (Signed)
Virtual Visit via telephone Note Due to COVID-19 pandemic this visit was conducted virtually. This visit type was conducted due to national recommendations for restrictions regarding the COVID-19 Pandemic (e.g. social distancing, sheltering in place) in an effort to limit this patient's exposure and mitigate transmission in our community. All issues noted in this document were discussed and addressed.  A physical exam was not performed with this format.  I connected with Rita Barry on 01/19/19 at 10:50 AM by telephone and verified that I am speaking with the correct person using two identifiers. Rita Barry is currently located at home and no one is currently with her during visit. The provider, Jannifer Rodneyhristy Hawks, FNP is located in their office at time of visit.  I discussed the limitations, risks, security and privacy concerns of performing an evaluation and management service by telephone and the availability of in person appointments. I also discussed with the patient that there may be a patient responsible charge related to this service. The patient expressed understanding and agreed to proceed.   History and Present Illness:  PT presents to the office today for chronic follow up and pain mediation. Hypertension This is a chronic problem. The current episode started more than 1 year ago. The problem has been resolved since onset. The problem is controlled. Associated symptoms include anxiety. Pertinent negatives include no malaise/fatigue, peripheral edema or shortness of breath. Risk factors for coronary artery disease include dyslipidemia. The current treatment provides moderate improvement. There is no history of kidney disease, CAD/MI, CVA or heart failure.  Anxiety Presents for follow-up visit. Symptoms include irritability and restlessness. Patient reports no depressed mood, excessive worry, nervous/anxious behavior or shortness of breath. Symptoms occur most days. The severity of  symptoms is moderate. The quality of sleep is good.    Depression        This is a chronic problem.  The current episode started more than 1 year ago.   The onset quality is gradual.   The problem occurs intermittently.  Associated symptoms include irritable and restlessness.  Associated symptoms include no helplessness, no hopelessness and not sad.  Past treatments include SSRIs - Selective serotonin reuptake inhibitors.  Past medical history includes anxiety.   Fibromyalgia  PT  Takes Ultram as needed. 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 She reports intermittent aching pain of 4-5 out 10.    Review of Systems  Constitutional: Positive for irritability. Negative for malaise/fatigue.  Respiratory: Negative for shortness of breath.   Psychiatric/Behavioral: Positive for depression. The patient is not nervous/anxious.   All other systems reviewed and are negative.    Observations/Objective: No SOB or distress noted   Assessment and Plan: 1. Essential hypertension  2. Uncomplicated opioid dependence (HCC) - traMADol (ULTRAM) 50 MG tablet; Take 1-2 tablets (50-100 mg total) by mouth every 12 (twelve) hours as needed for severe pain.  Dispense: 60 tablet; Refill: 2  3. GAD (generalized anxiety disorder) - traMADol (ULTRAM) 50 MG tablet; Take 1-2 tablets (50-100 mg total) by mouth every 12 (twelve) hours as needed for severe pain.  Dispense: 60 tablet; Refill: 2  4. Hyperlipidemia, unspecified hyperlipidemia type  5. Pain medication agreement signed - traMADol (ULTRAM) 50 MG tablet; Take 1-2 tablets (50-100 mg total) by mouth every 12 (twelve) hours as needed for severe pain.  Dispense: 60 tablet; Refill: 2  6. Moderate episode of recurrent major depressive disorder (HCC)  7. Fibromyalgia - traMADol (ULTRAM) 50 MG tablet; Take  1-2 tablets (50-100 mg total) by mouth every 12 (twelve) hours as needed for severe pain.  Dispense: 60 tablet; Refill: 2  Pt reviewed in  [place] controlled database-No red flags noted Health maintenance  reviewed Continue healthy diet and exercise RTO in 3 months    I discussed the assessment and treatment plan with the patient. The patient was provided an opportunity to ask questions and all were answered. The patient agreed with the plan and demonstrated an understanding of the instructions.   The patient was advised to call back or seek an in-person evaluation if the symptoms worsen or if the condition fails to improve as anticipated.  The above assessment and management plan was discussed with the patient. The patient verbalized understanding of and has agreed to the management plan. Patient is aware to call the clinic if symptoms persist or worsen. Patient is aware when to return to the clinic for a follow-up visit. Patient educated on when it is appropriate to go to the emergency department.   Time call ended:  11:06 AM   I provided 16  minutes of non-face-to-face time during this encounter.    [name], FNP

## 2019-03-07 ENCOUNTER — Ambulatory Visit: Payer: Federal, State, Local not specified - PPO

## 2019-04-05 ENCOUNTER — Telehealth: Payer: Self-pay | Admitting: Family

## 2019-04-05 NOTE — Telephone Encounter (Signed)
Scheduled pt for 3 month follow up with [facility] as she was due for f/u.

## 2019-04-20 ENCOUNTER — Other Ambulatory Visit: Payer: Self-pay | Admitting: Family

## 2019-04-20 DIAGNOSIS — I10 Essential (primary) hypertension: Secondary | ICD-10-CM

## 2019-04-23 ENCOUNTER — Ambulatory Visit (INDEPENDENT_AMBULATORY_CARE_PROVIDER_SITE_OTHER): Payer: Federal, State, Local not specified - PPO

## 2019-04-23 ENCOUNTER — Encounter: Payer: Self-pay | Admitting: Family

## 2019-04-23 ENCOUNTER — Other Ambulatory Visit: Payer: Self-pay

## 2019-04-23 ENCOUNTER — Ambulatory Visit: Payer: Federal, State, Local not specified - PPO | Admitting: Family

## 2019-04-23 VITALS — BP 141/94 | HR 79 | Temp 98.1°F | Ht 67.5 in | Wt 179.2 lb

## 2019-04-23 DIAGNOSIS — S20211A Contusion of right front wall of thorax, initial encounter: Secondary | ICD-10-CM | POA: Diagnosis not present

## 2019-04-23 DIAGNOSIS — R079 Chest pain, unspecified: Secondary | ICD-10-CM | POA: Diagnosis not present

## 2019-04-23 DIAGNOSIS — R0781 Pleurodynia: Secondary | ICD-10-CM

## 2019-04-23 DIAGNOSIS — S299XXA Unspecified injury of thorax, initial encounter: Secondary | ICD-10-CM | POA: Diagnosis not present

## 2019-04-23 MED ORDER — DICLOFENAC SODIUM 75 MG PO TBEC: 75.0000 mg | DELAYED_RELEASE_TABLET | Freq: Two times a day (BID) | ORAL | 0 refills | Status: DC

## 2019-04-23 NOTE — Progress Notes (Signed)
Subjective:    Patient ID: Rita Barry, female    DOB: 1965/06/04, 53 y.o.   MRN: [id number]  Chief Complaint  Patient presents with  . fell and caused injury and pain in rib    x-ray done    Fall The accident occurred 12 to 24 hours ago. The fall occurred while walking. She landed on dirt. There was no blood loss. The point of impact was the right elbow and right knee (right ribs). Pain location: right rib. The pain is at a severity of 9/10. The pain is moderate. Exacerbated by: coughing, sneezing. Pertinent negatives include no fever, headaches, hematuria, loss of consciousness or nausea. She has tried rest and NSAID for the symptoms. The treatment provided no relief.      Review of Systems  Constitutional: Negative for fever.  Gastrointestinal: Negative for nausea.  Genitourinary: Negative for hematuria.  Neurological: Negative for loss of consciousness and headaches.  All other systems reviewed and are negative.      Objective:   Physical Exam Vitals reviewed.  Constitutional:      General: She is not in acute distress.    Appearance: She is well-developed.  HENT:     Head: Normocephalic and atraumatic.  Eyes:     Pupils: Pupils are equal, round, and reactive to light.  Neck:     Thyroid: No thyromegaly.  Cardiovascular:     Rate and Rhythm: Normal rate and regular rhythm.     Heart sounds: Normal heart sounds. No murmur.  Pulmonary:     Effort: Pulmonary effort is normal. No respiratory distress.     Breath sounds: Normal breath sounds. No wheezing.  Abdominal:     General: Bowel sounds are normal. There is no distension.     Palpations: Abdomen is soft.     Tenderness: There is no abdominal tenderness.  Musculoskeletal:        General: No tenderness. Normal range of motion.       Arms:     Cervical back: Normal range of motion and neck supple.     Comments: Pain in right lower ribs with deep breathing or palpation   Skin:    General: Skin is warm and  dry.  Neurological:     Mental Status: She is alert and oriented to person, place, and time.     Cranial Nerves: No cranial nerve deficit.     Deep Tendon Reflexes: Reflexes are normal and symmetric.  Psychiatric:        Behavior: Behavior normal.        Thought Content: Thought content normal.        Judgment: Judgment normal.      BP (!) 141/94   Pulse 79   Temp 98.1 F (36.7 C) (Temporal)   Ht 5' 7.5" (1.715 m)   Wt 179 lb 3.2 oz (81.3 kg)   LMP 04/16/2016 (Approximate)   SpO2 98%   BMI 27.65 kg/m       Assessment & Plan:  Rita Barry comes in today with chief complaint of fell and caused injury and pain in rib (x-ray done)   Diagnosis and orders addressed:  1. Pain in rib - DG Ribs Unilateral W/Chest Right; Future - diclofenac (VOLTAREN) 75 MG EC tablet; Take 1 tablet (75 mg total) by mouth 2 (two) times daily.  Dispense: 60 tablet; Refill: 0  2. Contusion of rib on right side, initial encounter - diclofenac (VOLTAREN) 75 MG EC tablet; Take 1 tablet (  75 mg total) by mouth 2 (two) times daily.  Dispense: 60 tablet; Refill: 0  Rest Force fluids Diclofenac BID for next 5-7 days Deep breathing and coughing encouraged Call if symptoms worsen or do not improve    [name], FNP

## 2019-04-23 NOTE — Patient Instructions (Signed)
Rib Fracture  A rib fracture is a break or crack in one of the bones of the ribs. The ribs are long, curved bones that wrap around your chest and attach to your spine and your breastbone. The ribs protect your heart, lungs, and other organs in the chest. A broken or cracked rib is often painful but is not usually serious. Most rib fractures heal on their own over time. However, rib fractures can be more serious if multiple ribs are broken or if broken ribs move out of place and push against other structures or organs. What are the causes? This condition is caused by:  Repetitive movements with high force, such as pitching a baseball or having severe coughing spells.  A direct blow to the chest, such as a sports injury, a car accident, or a fall.  Cancer that has spread to the bones, which can weaken bones and cause them to break. What are the signs or symptoms? Symptoms of this condition include:  Pain when you breathe in or cough.  Pain when someone presses on the injured area.  Feeling short of breath. How is this diagnosed? This condition is diagnosed with a physical exam and medical history. Imaging tests may also be done, such as:  Chest X-ray.  CT scan.  MRI.  Bone scan.  Chest ultrasound. How is this treated? Treatment for this condition depends on the severity of the fracture. Most rib fractures usually heal on their own in 1-3 months. Sometimes healing takes longer if there is a cough that does not stop or if there are other activities that make the injury worse (aggravating factors). While you heal, you will be given medicines to control the pain. You will also be taught deep breathing exercises. Severe injuries may require hospitalization or surgery. Follow these instructions at home: Managing pain, stiffness, and swelling  If directed, apply ice to the injured area. ? Put ice in a plastic bag. ? Place a towel between your skin and the bag. ? Leave the ice on for  20 minutes, 2-3 times a day.  Take over-the-counter and prescription medicines only as told by your health care provider. Activity  Avoid a lot of activity and any activities or movements that cause pain. Be careful during activities and avoid bumping the injured rib.  Slowly increase your activity as told by your health care provider. General instructions  Do deep breathing exercises as told by your health care provider. This helps prevent pneumonia, which is a common complication of a broken rib. Your health care provider may instruct you to: ? Take deep breaths several times a day. ? Try to cough several times a day, holding a pillow against the injured area. ? Use a device called incentive spirometer to practice deep breathing several times a day.  Drink enough fluid to keep your urine pale yellow.  Do not wear a rib belt or binder. These restrict breathing, which can lead to pneumonia.  Keep all follow-up visits as told by your health care provider. This is important. Contact a health care provider if:  You have a fever. Get help right away if:  You have difficulty breathing or you are short of breath.  You develop a cough that does not stop, or you cough up thick or bloody sputum.  You have nausea, vomiting, or pain in your abdomen.  Your pain gets worse and medicine does not help. Summary  A rib fracture is a break or crack in one of   the bones of the ribs.  A broken or cracked rib is often painful but is not usually serious.  Most rib fractures heal on their own over time.  Treatment for this condition depends on the severity of the fracture.  Avoid a lot of activity and any activities or movements that cause pain. This information is not intended to replace advice given to you by your health care provider. Make sure you discuss any questions you have with your health care provider. Document Released: 04/19/2005 Document Revised: 04/01/2017 Document Reviewed:  07/19/2016 Elsevier Patient Education  2020 [employer]  

## 2019-04-24 ENCOUNTER — Telehealth: Payer: Self-pay | Admitting: Family

## 2019-04-24 DIAGNOSIS — F411 Generalized anxiety disorder: Secondary | ICD-10-CM

## 2019-04-24 DIAGNOSIS — M797 Fibromyalgia: Secondary | ICD-10-CM

## 2019-04-24 DIAGNOSIS — Z0289 Encounter for other administrative examinations: Secondary | ICD-10-CM

## 2019-04-24 DIAGNOSIS — F1120 Opioid dependence, uncomplicated: Secondary | ICD-10-CM

## 2019-04-24 MED ORDER — TRAMADOL HCL 50 MG PO TABS: 100.0000 mg | ORAL_TABLET | Freq: Four times a day (QID) | ORAL | 0 refills | Status: DC | PRN

## 2019-04-24 NOTE — Telephone Encounter (Signed)
Pt aware.

## 2019-04-24 NOTE — Telephone Encounter (Signed)
Ultram Prescription sent to pharmacy. I have increased it 100 mg every 6 hours as needed.

## 2019-04-24 NOTE — Telephone Encounter (Signed)
Patient says she saw [facility] yesterday and was prescribed an anti inflammatory for pain but says the Rx isnt touching her pain. Wants to know if something else can be called in for her.

## 2019-05-07 ENCOUNTER — Ambulatory Visit: Payer: Federal, State, Local not specified - PPO | Admitting: Family

## 2019-05-07 ENCOUNTER — Encounter: Payer: Self-pay | Admitting: Family

## 2019-05-07 ENCOUNTER — Other Ambulatory Visit: Payer: Self-pay

## 2019-05-07 VITALS — BP 134/86 | HR 86 | Temp 96.8°F | Ht 67.5 in | Wt 181.0 lb

## 2019-05-07 DIAGNOSIS — I10 Essential (primary) hypertension: Secondary | ICD-10-CM | POA: Diagnosis not present

## 2019-05-07 DIAGNOSIS — M797 Fibromyalgia: Secondary | ICD-10-CM | POA: Diagnosis not present

## 2019-05-07 DIAGNOSIS — E559 Vitamin D deficiency, unspecified: Secondary | ICD-10-CM

## 2019-05-07 DIAGNOSIS — F411 Generalized anxiety disorder: Secondary | ICD-10-CM

## 2019-05-07 DIAGNOSIS — F331 Major depressive disorder, recurrent, moderate: Secondary | ICD-10-CM | POA: Diagnosis not present

## 2019-05-07 DIAGNOSIS — E785 Hyperlipidemia, unspecified: Secondary | ICD-10-CM | POA: Diagnosis not present

## 2019-05-07 DIAGNOSIS — Z0289 Encounter for other administrative examinations: Secondary | ICD-10-CM

## 2019-05-07 DIAGNOSIS — K2960 Other gastritis without bleeding: Secondary | ICD-10-CM

## 2019-05-07 DIAGNOSIS — F1120 Opioid dependence, uncomplicated: Secondary | ICD-10-CM

## 2019-05-07 MED ORDER — TRAMADOL HCL 50 MG PO TABS: 100.0000 mg | ORAL_TABLET | Freq: Four times a day (QID) | ORAL | 2 refills | Status: DC | PRN

## 2019-05-07 NOTE — Patient Instructions (Signed)
Health Maintenance, Female Adopting a healthy lifestyle and getting preventive care are important in promoting health and wellness. Ask your health care provider about:  The right schedule for you to have regular tests and exams.  Things you can do on your own to prevent diseases and keep yourself healthy. What should I know about diet, weight, and exercise? Eat a healthy diet   Eat a diet that includes plenty of vegetables, fruits, low-fat dairy products, and lean protein.  Do not eat a lot of foods that are high in solid fats, added sugars, or sodium. Maintain a healthy weight Body mass index (BMI) is used to identify weight problems. It estimates body fat based on height and weight. Your health care provider can help determine your BMI and help you achieve or maintain a healthy weight. Get regular exercise Get regular exercise. This is one of the most important things you can do for your health. Most adults should:  Exercise for at least 150 minutes each week. The exercise should increase your heart rate and make you sweat (moderate-intensity exercise).  Do strengthening exercises at least twice a week. This is in addition to the moderate-intensity exercise.  Spend less time sitting. Even light physical activity can be beneficial. Watch cholesterol and blood lipids Have your blood tested for lipids and cholesterol at 55 years of age, then have this test every 5 years. Have your cholesterol levels checked more often if:  Your lipid or cholesterol levels are high.  You are older than 55 years of age.  You are at high risk for heart disease. What should I know about cancer screening? Depending on your health history and family history, you may need to have cancer screening at various ages. This may include screening for:  Breast cancer.  Cervical cancer.  Colorectal cancer.  Skin cancer.  Lung cancer. What should I know about heart disease, diabetes, and high blood  pressure? Blood pressure and heart disease  High blood pressure causes heart disease and increases the risk of stroke. This is more likely to develop in people who have high blood pressure readings, are of African descent, or are overweight.  Have your blood pressure checked: ? Every 3-5 years if you are 18-39 years of age. ? Every year if you are 40 years old or older. Diabetes Have regular diabetes screenings. This checks your fasting blood sugar level. Have the screening done:  Once every three years after age 40 if you are at a normal weight and have a low risk for diabetes.  More often and at a younger age if you are overweight or have a high risk for diabetes. What should I know about preventing infection? Hepatitis B If you have a higher risk for hepatitis B, you should be screened for this virus. Talk with your health care provider to find out if you are at risk for hepatitis B infection. Hepatitis C Testing is recommended for:  Everyone born from 1945 through 1965.  Anyone with known risk factors for hepatitis C. Sexually transmitted infections (STIs)  Get screened for STIs, including gonorrhea and chlamydia, if: ? You are sexually active and are younger than 55 years of age. ? You are older than 55 years of age and your health care provider tells you that you are at risk for this type of infection. ? Your sexual activity has changed since you were last screened, and you are at increased risk for chlamydia or gonorrhea. Ask your health care provider if   you are at risk.  Ask your health care provider about whether you are at high risk for HIV. Your health care provider may recommend a prescription medicine to help prevent HIV infection. If you choose to take medicine to prevent HIV, you should first get tested for HIV. You should then be tested every 3 months for as long as you are taking the medicine. Pregnancy  If you are about to stop having your period (premenopausal) and  you may become pregnant, seek counseling before you get pregnant.  Take 400 to 800 micrograms (mcg) of folic acid every day if you become pregnant.  Ask for birth control (contraception) if you want to prevent pregnancy. Osteoporosis and menopause Osteoporosis is a disease in which the bones lose minerals and strength with aging. This can result in bone fractures. If you are 65 years old or older, or if you are at risk for osteoporosis and fractures, ask your health care provider if you should:  Be screened for bone loss.  Take a calcium or vitamin D supplement to lower your risk of fractures.  Be given hormone replacement therapy (HRT) to treat symptoms of menopause. Follow these instructions at home: Lifestyle  Do not use any products that contain nicotine or tobacco, such as cigarettes, e-cigarettes, and chewing tobacco. If you need help quitting, ask your health care provider.  Do not use street drugs.  Do not share needles.  Ask your health care provider for help if you need support or information about quitting drugs. Alcohol use  Do not drink alcohol if: ? Your health care provider tells you not to drink. ? You are pregnant, may be pregnant, or are planning to become pregnant.  If you drink alcohol: ? Limit how much you use to 0-1 drink a day. ? Limit intake if you are breastfeeding.  Be aware of how much alcohol is in your drink. In the [place] one drink equals one 12 oz bottle of beer (355 mL), one 5 oz glass of wine (148 mL), or one 1 oz glass of hard liquor (44 mL). General instructions  Schedule regular health, dental, and eye exams.  Stay current with your vaccines.  Tell your health care provider if: ? You often feel depressed. ? You have ever been abused or do not feel safe at home. Summary  Adopting a healthy lifestyle and getting preventive care are important in promoting health and wellness.  Follow your health care provider's instructions about healthy  diet, exercising, and getting tested or screened for diseases.  Follow your health care provider's instructions on monitoring your cholesterol and blood pressure. This information is not intended to replace advice given to you by your health care provider. Make sure you discuss any questions you have with your health care provider. Document Revised: 04/12/2018 Document Reviewed: 04/12/2018 Elsevier Patient Education  2020 [employer]  

## 2019-05-07 NOTE — Progress Notes (Signed)
Subjective:    Patient ID: Rita Barry, female    DOB: [birth year], 54 y.o.   MRN: [id number]  Chief Complaint  Patient presents with  . Medical Management of Chronic Issues    three month recheck   PT presents to the office today for chronic follow up and pain mediation. Hypertension This is a chronic problem. The current episode started more than 1 year ago. The problem has been resolved since onset. The problem is controlled. Associated symptoms include anxiety and malaise/fatigue. Pertinent negatives include no headaches, peripheral edema or shortness of breath. Risk factors for coronary artery disease include dyslipidemia, obesity and sedentary lifestyle. The current treatment provides moderate improvement. There is no history of CAD/MI, CVA or heart failure.  Depression        This is a chronic problem.  The current episode started more than 1 year ago.   The onset quality is gradual.   The problem occurs intermittently.  The problem has been waxing and waning since onset.  Associated symptoms include irritable, restlessness, decreased interest and sad.  Associated symptoms include no helplessness, no hopelessness and no headaches.  Past treatments include SSRIs - Selective serotonin reuptake inhibitors.  Compliance with treatment is good.  Past medical history includes anxiety.   Anxiety Presents for follow-up visit. Symptoms include depressed mood, excessive worry, irritability, nervous/anxious behavior and restlessness. Patient reports no nausea or shortness of breath. Symptoms occur occasionally. The severity of symptoms is moderate. The quality of sleep is good.    Arthritis Presents for follow-up visit. She complains of pain. The symptoms have been stable. Affected location: back. Her pain is at a severity of 6/10.  Gastroesophageal Reflux She complains of belching and heartburn. She reports no coughing or no nausea. This is a chronic problem. The current episode started more  than 1 year ago. The problem occurs occasionally. The problem has been waxing and waning.      Review of Systems  Constitutional: Positive for irritability and malaise/fatigue.  Respiratory: Negative for cough and shortness of breath.   Gastrointestinal: Positive for heartburn. Negative for nausea.  Musculoskeletal: Positive for arthritis.  Neurological: Negative for headaches.  Psychiatric/Behavioral: Positive for depression. The patient is nervous/anxious.   All other systems reviewed and are negative.      Objective:   Physical Exam Vitals reviewed.  Constitutional:      General: She is irritable. She is not in acute distress.    Appearance: She is well-developed.  HENT:     Head: Normocephalic and atraumatic.     Right Ear: Tympanic membrane normal.     Left Ear: Tympanic membrane normal.  Eyes:     Pupils: Pupils are equal, round, and reactive to light.  Neck:     Thyroid: No thyromegaly.  Cardiovascular:     Rate and Rhythm: Normal rate and regular rhythm.     Heart sounds: Normal heart sounds. No murmur.  Pulmonary:     Effort: Pulmonary effort is normal. No respiratory distress.     Breath sounds: Normal breath sounds. No wheezing.  Abdominal:     General: Bowel sounds are normal. There is no distension.     Palpations: Abdomen is soft.     Tenderness: There is no abdominal tenderness.  Musculoskeletal:        General: No tenderness. Normal range of motion.     Cervical back: Normal range of motion and neck supple.  Skin:    General: Skin is warm and  dry.  Neurological:     Mental Status: She is alert and oriented to person, place, and time.     Cranial Nerves: No cranial nerve deficit.     Deep Tendon Reflexes: Reflexes are normal and symmetric.  Psychiatric:        Behavior: Behavior normal.        Thought Content: Thought content normal.        Judgment: Judgment normal.     BP 134/86   Pulse 86   Temp (!) 96.8 F (36 C) (Temporal)   Ht 5' 7.5"  (1.715 m)   Wt 181 lb (82.1 kg)   LMP 04/16/2016 (Approximate)   SpO2 96%   BMI 27.93 kg/m      Assessment & Plan:  RENLEIGH OUELLET comes in today with chief complaint of Medical Management of Chronic Issues (three month recheck)   Diagnosis and orders addressed:  1. Essential hypertension - CMP14+EGFR - CBC with Differential/Platelet  2. GAD (generalized anxiety disorder) - CMP14+EGFR - CBC with Differential/Platelet - traMADol (ULTRAM) 50 MG tablet; Take 2 tablets (100 mg total) by mouth every 6 (six) hours as needed for severe pain.  Dispense: 120 tablet; Refill: 2  3. Moderate episode of recurrent major depressive disorder (HCC) - CMP14+EGFR - CBC with Differential/Platelet  4. Fibromyalgia - CMP14+EGFR - CBC with Differential/Platelet - traMADol (ULTRAM) 50 MG tablet; Take 2 tablets (100 mg total) by mouth every 6 (six) hours as needed for severe pain.  Dispense: 120 tablet; Refill: 2  5. Hyperlipidemia, unspecified hyperlipidemia type - CMP14+EGFR - CBC with Differential/Platelet  6. Uncomplicated opioid dependence (HCC) - CMP14+EGFR - CBC with Differential/Platelet - traMADol (ULTRAM) 50 MG tablet; Take 2 tablets (100 mg total) by mouth every 6 (six) hours as needed for severe pain.  Dispense: 120 tablet; Refill: 2  7. Vitamin D deficiency - CMP14+EGFR - CBC with Differential/Platelet - Vitamin D 25 hydroxy  8. Pain medication agreement signed - CMP14+EGFR - CBC with Differential/Platelet - traMADol (ULTRAM) 50 MG tablet; Take 2 tablets (100 mg total) by mouth every 6 (six) hours as needed for severe pain.  Dispense: 120 tablet; Refill: 2  9. Erosive gastritis  Pt reviewed in [place] controlled database- No red flags noted, drug screen and contract UTD Labs pending Health Maintenance reviewed Diet and exercise encouraged  Follow up plan: 3 months    [name], FNP

## 2019-05-08 LAB — CBC WITH DIFFERENTIAL/PLATELET
Basophils Absolute: 0.1 10*3/uL (ref 0.0–0.2)
Basos: 1 %
EOS (ABSOLUTE): 1.7 10*3/uL — ABNORMAL HIGH (ref 0.0–0.4)
Eos: 18 %
Hematocrit: 39.1 % (ref 34.0–46.6)
Hemoglobin: 13.3 g/dL (ref 11.1–15.9)
Immature Grans (Abs): 0 10*3/uL (ref 0.0–0.1)
Immature Granulocytes: 0 %
Lymphocytes Absolute: 2.5 10*3/uL (ref 0.7–3.1)
Lymphs: 26 %
MCH: 33.6 pg — ABNORMAL HIGH (ref 26.6–33.0)
MCHC: 34 g/dL (ref 31.5–35.7)
MCV: 99 fL — ABNORMAL HIGH (ref 79–97)
Monocytes Absolute: 0.4 10*3/uL (ref 0.1–0.9)
Monocytes: 5 %
Neutrophils Absolute: 4.6 10*3/uL (ref 1.4–7.0)
Neutrophils: 50 %
Platelets: 331 10*3/uL (ref 150–450)
RBC: 3.96 x10E6/uL (ref 3.77–5.28)
RDW: 12.9 % (ref 11.7–15.4)
WBC: 9.3 10*3/uL (ref 3.4–10.8)

## 2019-05-08 LAB — CMP14+EGFR
ALT: 41 IU/L — ABNORMAL HIGH (ref 0–32)
AST: 48 IU/L — ABNORMAL HIGH (ref 0–40)
Albumin/Globulin Ratio: 1.8 (ref 1.2–2.2)
Albumin: 4.2 g/dL (ref 3.8–4.9)
Alkaline Phosphatase: 39 IU/L (ref 39–117)
BUN/Creatinine Ratio: 26 — ABNORMAL HIGH (ref 9–23)
BUN: 20 mg/dL (ref 6–24)
Bilirubin Total: 0.4 mg/dL (ref 0.0–1.2)
CO2: 28 mmol/L (ref 20–29)
Calcium: 9.5 mg/dL (ref 8.7–10.2)
Chloride: 97 mmol/L (ref 96–106)
Creatinine, Ser: 0.77 mg/dL (ref 0.57–1.00)
GFR calc Af Amer: 101 mL/min/{1.73_m2} (ref >59)
GFR calc non Af Amer: 88 mL/min/{1.73_m2} (ref >59)
Globulin, Total: 2.3 g/dL (ref 1.5–4.5)
Glucose: 102 mg/dL — ABNORMAL HIGH (ref 65–99)
Potassium: 4.6 mmol/L (ref 3.5–5.2)
Sodium: 138 mmol/L (ref 134–144)
Total Protein: 6.5 g/dL (ref 6.0–8.5)

## 2019-05-08 LAB — VITAMIN D 25 HYDROXY (VIT D DEFICIENCY, FRACTURES): Vit D, 25-Hydroxy: 35.7 ng/mL (ref 30.0–100.0)

## 2019-05-16 ENCOUNTER — Other Ambulatory Visit: Payer: Self-pay | Admitting: Family

## 2019-05-16 DIAGNOSIS — R0781 Pleurodynia: Secondary | ICD-10-CM

## 2019-05-16 DIAGNOSIS — S20211A Contusion of right front wall of thorax, initial encounter: Secondary | ICD-10-CM

## 2019-05-16 DIAGNOSIS — S298XXA Other specified injuries of thorax, initial encounter: Secondary | ICD-10-CM

## 2019-05-16 DIAGNOSIS — R0789 Other chest pain: Secondary | ICD-10-CM

## 2019-06-01 ENCOUNTER — Ambulatory Visit: Payer: Federal, State, Local not specified - PPO | Admitting: Family Medicine

## 2019-06-01 ENCOUNTER — Ambulatory Visit (HOSPITAL_COMMUNITY)
Admission: RE | Admit: 2019-06-01 | Discharge: 2019-06-01 | Disposition: A | Discharge status: Home / Self Care | Payer: Federal, State, Local not specified - PPO | Source: Ambulatory Visit | Attending: Family Medicine | Admitting: Family Medicine

## 2019-06-01 ENCOUNTER — Encounter: Payer: Self-pay | Admitting: Family Medicine

## 2019-06-01 ENCOUNTER — Other Ambulatory Visit: Payer: Self-pay

## 2019-06-01 VITALS — BP 135/85 | HR 88 | Temp 96.2°F | Ht 67.5 in | Wt 180.6 lb

## 2019-06-01 DIAGNOSIS — M79661 Pain in right lower leg: Secondary | ICD-10-CM | POA: Insufficient documentation

## 2019-06-01 NOTE — Progress Notes (Signed)
Chief Complaint  Patient presents with  . Leg Pain    Right, Cramping    HPI  Patient presents today for 1 week of increasing cramps at right calf. She is having to walk different from usual to compensate for broken ribs from injury 6 weeks ago. She is also concerned that there is a blood clot because she had a DVT in that leg about 2 years ago. Would like to have that ruled out.  PMH: Smoking status noted ROS: Per HPI  Objective: BP 135/85   Pulse 88   Temp (!) 96.2 F (35.7 C) (Temporal)   Ht 5' 7.5" (1.715 m)   Wt 180 lb 9.6 oz (81.9 kg)   LMP 04/16/2016 (Approximate)   BMI 27.87 kg/m  Gen: NAD, alert, cooperative with exam HEENT: NCAT, EOMI, PERRL CV: RRR, good S1/S2, no murmur Resp: CTABL, no wheezes, non-labored Ext: No edema, warm. Tender at posterior right calf. No edema or erythema.Neuro: Alert and oriented, No gross deficits  Assessment and plan:  1. Right calf pain     Orders Placed This Encounter  Procedures  . US Venous Img Lower Bilateral    Hold patient    Standing Status:   Future    Standing Expiration Date:   07/29/2020    Order Specific Question:   Reason for Exam (SYMPTOM  OR DIAGNOSIS REQUIRED)    Answer:   leg pain    Order Specific Question:   Preferred imaging location?    Answer:   [facility]    Order Specific Question:   Call Results- Best Contact Number?    Answer:   [phone] Mechele Claude   Should her test be positive she will need to be started on xarelto ASAP. She would need work up to determine if she has a coagulation deficit such as Facto 5 Leiden . Should she be negative, a steroid taper should be sufficient, along with calf stretching. Follow up as needed.  Mechele Claude, MD

## 2019-06-13 ENCOUNTER — Other Ambulatory Visit: Payer: Self-pay | Admitting: Family

## 2019-06-13 DIAGNOSIS — R0781 Pleurodynia: Secondary | ICD-10-CM

## 2019-06-13 DIAGNOSIS — S20211A Contusion of right front wall of thorax, initial encounter: Secondary | ICD-10-CM

## 2019-07-06 ENCOUNTER — Other Ambulatory Visit: Payer: Self-pay | Admitting: Family

## 2019-07-06 DIAGNOSIS — I10 Essential (primary) hypertension: Secondary | ICD-10-CM

## 2019-07-18 DIAGNOSIS — Z1231 Encounter for screening mammogram for malignant neoplasm of breast: Secondary | ICD-10-CM | POA: Diagnosis not present

## 2019-07-26 ENCOUNTER — Ambulatory Visit: Payer: Federal, State, Local not specified - PPO | Attending: Internal Medicine

## 2019-07-26 DIAGNOSIS — Z23 Encounter for immunization: Secondary | ICD-10-CM

## 2019-07-26 NOTE — Progress Notes (Signed)
   Covid-19 Vaccination Clinic  Name:  Rita Barry    MRN: [id number] DOB: [birth year]  07/26/2019  Rita Barry was observed post Covid-19 immunization for 15 minutes without incident. She was provided with Vaccine Information Sheet and instruction to access the V-Safe system.   Rita Barry was instructed to call 911 with any severe reactions post vaccine: Marland Kitchen Difficulty breathing  . Swelling of face and throat  . A fast heartbeat  . A bad rash all over body  . Dizziness and weakness   Immunizations Administered    Name Date Dose VIS Date Route   Moderna COVID-19 Vaccine 07/26/2019 11:55 AM 0.5 mL 04/03/2019 Intramuscular   Manufacturer: Moderna   Lot: [id number]   NDC: 02725-366-44

## 2019-07-29 ENCOUNTER — Other Ambulatory Visit: Payer: Self-pay | Admitting: Family

## 2019-07-29 DIAGNOSIS — I10 Essential (primary) hypertension: Secondary | ICD-10-CM

## 2019-08-16 ENCOUNTER — Ambulatory Visit: Payer: Federal, State, Local not specified - PPO | Admitting: Orthopaedic Surgery

## 2019-08-16 ENCOUNTER — Other Ambulatory Visit: Payer: Self-pay

## 2019-08-21 ENCOUNTER — Other Ambulatory Visit: Payer: Self-pay | Admitting: Family

## 2019-08-21 DIAGNOSIS — I10 Essential (primary) hypertension: Secondary | ICD-10-CM

## 2019-08-28 ENCOUNTER — Other Ambulatory Visit: Payer: Self-pay | Admitting: Family

## 2019-08-28 ENCOUNTER — Ambulatory Visit: Payer: Federal, State, Local not specified - PPO | Attending: Internal Medicine

## 2019-08-28 DIAGNOSIS — I10 Essential (primary) hypertension: Secondary | ICD-10-CM

## 2019-08-28 DIAGNOSIS — Z23 Encounter for immunization: Secondary | ICD-10-CM

## 2019-08-28 NOTE — Progress Notes (Signed)
   Covid-19 Vaccination Clinic  Name:  Rita Barry    MRN: [id number] DOB: 08-14-1965  08/28/2019  Ms. Rita Barry was observed post Covid-19 immunization for 15 minutes without incident. She was provided with Vaccine Information Sheet and instruction to access the V-Safe system.   Ms. Rita Barry was instructed to call 911 with any severe reactions post vaccine: Marland Kitchen Difficulty breathing  . Swelling of face and throat  . A fast heartbeat  . A bad rash all over body  . Dizziness and weakness   Immunizations Administered    Name Date Dose VIS Date Route   Moderna COVID-19 Vaccine 08/28/2019 10:07 AM 0.5 mL 04/2019 Intramuscular   Manufacturer: Moderna   Lot: [id number]   NDC: 10026-285-49

## 2019-10-16 ENCOUNTER — Other Ambulatory Visit: Payer: Self-pay | Admitting: Family

## 2019-10-18 ENCOUNTER — Other Ambulatory Visit: Payer: Self-pay

## 2019-10-18 ENCOUNTER — Ambulatory Visit (INDEPENDENT_AMBULATORY_CARE_PROVIDER_SITE_OTHER): Payer: Federal, State, Local not specified - PPO | Admitting: Orthopaedic Surgery

## 2019-10-18 ENCOUNTER — Encounter: Payer: Self-pay | Admitting: Orthopaedic Surgery

## 2019-10-18 DIAGNOSIS — M65331 Trigger finger, right middle finger: Secondary | ICD-10-CM | POA: Diagnosis not present

## 2019-10-18 NOTE — Progress Notes (Signed)
Office Visit Note   Patient: Rita Barry           Date of Birth: [birth year]           MRN: [id number] Visit Date: 10/18/2019              Requested by: Sharion Balloon, [place],  [place] [address] PCP: Sharion Balloon, FNP   Assessment & Plan: Visit Diagnoses:  1. Trigger finger, right middle finger     Plan: We discussed operative intervention with A1 pulley release for her chronic right long finger triggering.  Procedure discussed risk discussed all questions answered.  Follow-Up Instructions: No follow-ups on file.   Orders:  No orders of the defined types were placed in this encounter.  No orders of the defined types were placed in this encounter.     Procedures: No procedures performed   Clinical Data: No additional findings.   Subjective: Chief Complaint  Patient presents with  . trigger finger    right middle finger    HPI 54 year old female returns with persistent problems with right long finger triggering.  She is unable to flex her finger if she does not manually she gets active triggering.  Previous injection 10/26/2018 which helped for a few weeks and then she had recurrent triggering again.  Patient is right-hand dominant and states she like to proceed with surgical treatment for the chronic trigger finger present now for over 1 year.  Review of Systems Positive for hyperlipidemia depression, tramadol 100 mg every 6 hours chronic opioid dependency.  Otherwise negative as pertains HPI.  Objective: Vital Signs: Ht 5' 7.5" (1.715 m)   Wt 170 lb (77.1 kg)   LMP 04/16/2016 (Approximate)   BMI 26.23 kg/m   Physical Exam Constitutional:      Appearance: She is well-developed.  HENT:     Head: Normocephalic.     Right Ear: External ear normal.     Left Ear: External ear normal.  Eyes:     Pupils: Pupils are equal, round, and reactive to light.  Neck:     Thyroid: No thyromegaly.     Trachea: No tracheal deviation.    Cardiovascular:     Rate and Rhythm: Normal rate.  Pulmonary:     Effort: Pulmonary effort is normal.  Abdominal:     Palpations: Abdomen is soft.  Skin:    General: Skin is warm and dry.  Neurological:     Mental Status: She is alert and oriented to person, place, and time.  Psychiatric:        Behavior: Behavior normal.     Ortho Exam patient has palpable nodule over the A1 pulley right long finger.  No active triggering of other digits.  No tenderness over the A2 pulley.  Sensation the fingertip is intact normal capillary refill.  Good cervical range of motion elbow reaches full extension.  Specialty Comments:  No specialty comments available.  Imaging: No results found.   PMFS History: Patient Active Problem List   Diagnosis Date Noted  . Trigger finger, right middle finger 10/18/2019  . DVT, lower extremity, distal ([place]) 07/17/2018  . Esophagitis, eosinophilic 62/86/3817  . Erosive gastritis   . Lymphadenopathy, cervical 02/21/2016  . Esophageal dysphagia 02/21/2016  . Essential hypertension 11/26/2015  . Allergic rhinitis 08/25/2015  . Pain medication agreement signed 08/01/2015  . Opioid dependence ([place]) 08/01/2015  . Vitamin D deficiency 08/23/2014  . Hyperlipidemia 08/23/2014  . GAD (generalized  anxiety disorder) 09/18/2013  . Benign paroxysmal positional vertigo 03/02/2013  . General counseling for prescription of oral contraceptives 01/09/2013  . Dysmenorrhea 01/09/2013  . Depression 08/08/2012  . Fibromyalgia 08/08/2012   Past Medical History:  Diagnosis Date  . Anxiety   . Depression   . DVT (deep venous thrombosis) (HCC)    Right calf  . 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 Fibromyalgia    Questionable    Family History  Problem Relation Age of Onset  . Cancer Father        Throat  . Hypertension Mother   . Breast cancer Mother   . Diabetes Maternal Grandmother     Past Surgical History:   Procedure Laterality Date  . COLONOSCOPY N/A 09/01/2015   Procedure: COLONOSCOPY;  Surgeon: West Bali, MD;  Location: [facility];  Service: Endoscopy;  Laterality: N/A;  9:45 AM  . ESOPHAGOGASTRODUODENOSCOPY N/A 06/04/2016   Procedure: ESOPHAGOGASTRODUODENOSCOPY (EGD);  Surgeon: West Bali, MD;  Location: [facility];  Service: Endoscopy;  Laterality: N/A;  10:30 am  . SAVORY DILATION N/A 06/04/2016   Procedure: SAVORY DILATION;  Surgeon: West Bali, MD;  Location: [facility];  Service: Endoscopy;  Laterality: N/A;  . SHOULDER SURGERY     Left  . SHOULDER SURGERY Left    x2 surgeries   Social History   Occupational History  . Occupation: Works with H[employer]  Tobacco Use  . Smoking status: Never Smoker  . Smokeless tobacco: Never Used  Vaping Use  . Vaping Use: Never used  Substance and Sexual Activity  . Alcohol use: No    Comment: quit drinking 3 weeks ago  . Drug use: No  . Sexual activity: Yes

## 2019-10-31 ENCOUNTER — Ambulatory Visit: Payer: Federal, State, Local not specified - PPO | Admitting: Family

## 2019-10-31 ENCOUNTER — Encounter: Payer: Self-pay | Admitting: Family

## 2019-10-31 ENCOUNTER — Other Ambulatory Visit: Payer: Self-pay

## 2019-10-31 VITALS — BP 116/77 | HR 77 | Temp 98.1°F | Ht 67.5 in | Wt 182.4 lb

## 2019-10-31 DIAGNOSIS — I10 Essential (primary) hypertension: Secondary | ICD-10-CM

## 2019-10-31 DIAGNOSIS — F411 Generalized anxiety disorder: Secondary | ICD-10-CM | POA: Diagnosis not present

## 2019-10-31 DIAGNOSIS — Z1159 Encounter for screening for other viral diseases: Secondary | ICD-10-CM

## 2019-10-31 DIAGNOSIS — M797 Fibromyalgia: Secondary | ICD-10-CM | POA: Diagnosis not present

## 2019-10-31 DIAGNOSIS — E559 Vitamin D deficiency, unspecified: Secondary | ICD-10-CM | POA: Diagnosis not present

## 2019-10-31 DIAGNOSIS — F1120 Opioid dependence, uncomplicated: Secondary | ICD-10-CM | POA: Diagnosis not present

## 2019-10-31 DIAGNOSIS — Z0289 Encounter for other administrative examinations: Secondary | ICD-10-CM

## 2019-10-31 DIAGNOSIS — R062 Wheezing: Secondary | ICD-10-CM

## 2019-10-31 DIAGNOSIS — M65331 Trigger finger, right middle finger: Secondary | ICD-10-CM

## 2019-10-31 DIAGNOSIS — E785 Hyperlipidemia, unspecified: Secondary | ICD-10-CM

## 2019-10-31 DIAGNOSIS — H8110 Benign paroxysmal vertigo, unspecified ear: Secondary | ICD-10-CM

## 2019-10-31 DIAGNOSIS — F331 Major depressive disorder, recurrent, moderate: Secondary | ICD-10-CM

## 2019-10-31 DIAGNOSIS — D6862 Lupus anticoagulant syndrome: Secondary | ICD-10-CM | POA: Insufficient documentation

## 2019-10-31 MED ORDER — ALBUTEROL SULFATE HFA 108 (90 BASE) MCG/ACT IN AERS: 2.0000 | INHALATION_SPRAY | Freq: Four times a day (QID) | RESPIRATORY_TRACT | 1 refills | Status: DC | PRN

## 2019-10-31 MED ORDER — TRAMADOL HCL 50 MG PO TABS: 100.0000 mg | ORAL_TABLET | Freq: Four times a day (QID) | ORAL | 2 refills | Status: DC | PRN

## 2019-10-31 NOTE — Progress Notes (Signed)
Subjective:    Patient ID: Rita Barry, female    DOB: [birth year], 54 y.o.   MRN: [id number]  Chief Complaint  Patient presents with  . Medical Management of Chronic Issues   PT presents to the office today for chronic follow up and pain mediation. She is scheduled for trigger finger surgery 11/19/19. Hypertension This is a chronic problem. The current episode started more than 1 year ago. The problem has been resolved since onset. The problem is controlled. Associated symptoms include malaise/fatigue. Pertinent negatives include no peripheral edema or shortness of breath. Risk factors for coronary artery disease include dyslipidemia and obesity. The current treatment provides moderate improvement.  Gastroesophageal Reflux She complains of belching and heartburn. This is a chronic problem. The current episode started more than 1 year ago. The problem occurs occasionally. The problem has been resolved. The symptoms are aggravated by certain foods. Risk factors include Barrett's esophagus. She has tried nothing for the symptoms. The treatment provided moderate relief.  Hyperlipidemia This is a chronic problem. The current episode started more than 1 year ago. The problem is uncontrolled. Recent lipid tests were reviewed and are high. Pertinent negatives include no shortness of breath. The current treatment provides mild improvement of lipids. Risk factors for coronary artery disease include hypertension and a sedentary lifestyle.      Review of Systems  Constitutional: Positive for malaise/fatigue.  Respiratory: Negative for shortness of breath.   Gastrointestinal: Positive for heartburn.  All other systems reviewed and are negative.      Objective:   Physical Exam Vitals reviewed.  Constitutional:      General: She is not in acute distress.    Appearance: She is well-developed.  HENT:     Head: Normocephalic and atraumatic.     Right Ear: Tympanic membrane normal.     Left  Ear: Tympanic membrane normal.  Eyes:     Pupils: Pupils are equal, round, and reactive to light.  Neck:     Thyroid: No thyromegaly.  Cardiovascular:     Rate and Rhythm: Normal rate and regular rhythm.     Heart sounds: Normal heart sounds. No murmur heard.   Pulmonary:     Effort: Pulmonary effort is normal. No respiratory distress.     Breath sounds: Normal breath sounds. No wheezing.  Abdominal:     General: Bowel sounds are normal. There is no distension.     Palpations: Abdomen is soft.     Tenderness: There is no abdominal tenderness.  Musculoskeletal:        General: No tenderness. Normal range of motion.     Cervical back: Normal range of motion and neck supple.  Skin:    General: Skin is warm and dry.  Neurological:     Mental Status: She is alert and oriented to person, place, and time.     Cranial Nerves: No cranial nerve deficit.     Deep Tendon Reflexes: Reflexes are normal and symmetric.  Psychiatric:        Behavior: Behavior normal.        Thought Content: Thought content normal.        Judgment: Judgment normal.       BP 116/77   Pulse 77   Temp 98.1 F (36.7 C) (Temporal)   Ht 5' 7.5" (1.715 m)   Wt 182 lb 6.4 oz (82.7 kg)   LMP 04/16/2016 (Approximate)   BMI 28.15 kg/m      Assessment & Plan:  Rita Barry comes in today with chief complaint of Medical Management of Chronic Issues   Diagnosis and orders addressed:  1. GAD (generalized anxiety disorder) - traMADol (ULTRAM) 50 MG tablet; Take 2 tablets (100 mg total) by mouth every 6 (six) hours as needed for severe pain.  Dispense: 120 tablet; Refill: 2 - CMP14+EGFR - CBC with Differential/Platelet  2. Fibromyalgia - traMADol (ULTRAM) 50 MG tablet; Take 2 tablets (100 mg total) by mouth every 6 (six) hours as needed for severe pain.  Dispense: 120 tablet; Refill: 2 - CMP14+EGFR - CBC with Differential/Platelet  3. Uncomplicated opioid dependence (HCC) - traMADol (ULTRAM) 50 MG  tablet; Take 2 tablets (100 mg total) by mouth every 6 (six) hours as needed for severe pain.  Dispense: 120 tablet; Refill: 2 - CMP14+EGFR - CBC with Differential/Platelet  4. Pain medication agreement signed - traMADol (ULTRAM) 50 MG tablet; Take 2 tablets (100 mg total) by mouth every 6 (six) hours as needed for severe pain.  Dispense: 120 tablet; Refill: 2 - CMP14+EGFR - CBC with Differential/Platelet  5. Wheezing  6. Essential hypertension - CMP14+EGFR - CBC with Differential/Platelet  7. Benign paroxysmal positional vertigo, unspecified laterality - CMP14+EGFR - CBC with Differential/Platelet  8. Moderate episode of recurrent major depressive disorder (HCC) - CMP14+EGFR - CBC with Differential/Platelet  9. Hyperlipidemia, unspecified hyperlipidemia type - CMP14+EGFR - CBC with Differential/Platelet  10. Vitamin D deficiency - CMP14+EGFR - CBC with Differential/Platelet - VITAMIN D 25 Hydroxy (Vit-D Deficiency, Fractures)  11. Trigger finger, right middle finger - CMP14+EGFR - CBC with Differential/Platelet  12. Need for hepatitis C screening test - CMP14+EGFR - CBC with Differential/Platelet - Hepatitis C antibody   Labs pending Patient reviewed in [place] controlled database, no flags noted. Contract and drug screen are up dated today. Health Maintenance reviewed Diet and exercise encouraged  Follow up plan: 3 months    [name], FNP

## 2019-10-31 NOTE — Addendum Note (Signed)
Addended by: Austin Miles F on: 10/31/2019 12:42 PM   Modules accepted: Orders

## 2019-10-31 NOTE — Patient Instructions (Signed)

## 2019-11-01 LAB — CMP14+EGFR
ALT: 32 IU/L (ref 0–32)
AST: 34 IU/L (ref 0–40)
Albumin/Globulin Ratio: 2 (ref 1.2–2.2)
Albumin: 4.4 g/dL (ref 3.8–4.9)
Alkaline Phosphatase: 26 IU/L — ABNORMAL LOW (ref 48–121)
BUN/Creatinine Ratio: 25 — ABNORMAL HIGH (ref 9–23)
BUN: 19 mg/dL (ref 6–24)
Bilirubin Total: 0.4 mg/dL (ref 0.0–1.2)
CO2: 26 mmol/L (ref 20–29)
Calcium: 9.1 mg/dL (ref 8.7–10.2)
Chloride: 99 mmol/L (ref 96–106)
Creatinine, Ser: 0.76 mg/dL (ref 0.57–1.00)
GFR calc Af Amer: 103 mL/min/{1.73_m2} (ref >59)
GFR calc non Af Amer: 89 mL/min/{1.73_m2} (ref >59)
Globulin, Total: 2.2 g/dL (ref 1.5–4.5)
Glucose: 82 mg/dL (ref 65–99)
Potassium: 4.9 mmol/L (ref 3.5–5.2)
Sodium: 139 mmol/L (ref 134–144)
Total Protein: 6.6 g/dL (ref 6.0–8.5)

## 2019-11-01 LAB — CBC WITH DIFFERENTIAL/PLATELET
Basophils Absolute: 0.1 10*3/uL (ref 0.0–0.2)
Basos: 2 %
EOS (ABSOLUTE): 0.8 10*3/uL — ABNORMAL HIGH (ref 0.0–0.4)
Eos: 13 %
Hematocrit: 39.3 % (ref 34.0–46.6)
Hemoglobin: 12.9 g/dL (ref 11.1–15.9)
Immature Grans (Abs): 0 10*3/uL (ref 0.0–0.1)
Immature Granulocytes: 0 %
Lymphocytes Absolute: 1.6 10*3/uL (ref 0.7–3.1)
Lymphs: 27 %
MCH: 33 pg (ref 26.6–33.0)
MCHC: 32.8 g/dL (ref 31.5–35.7)
MCV: 101 fL — ABNORMAL HIGH (ref 79–97)
Monocytes Absolute: 0.6 10*3/uL (ref 0.1–0.9)
Monocytes: 10 %
Neutrophils Absolute: 3 10*3/uL (ref 1.4–7.0)
Neutrophils: 48 %
Platelets: 324 10*3/uL (ref 150–450)
RBC: 3.91 x10E6/uL (ref 3.77–5.28)
RDW: 12.5 % (ref 11.7–15.4)
WBC: 6.1 10*3/uL (ref 3.4–10.8)

## 2019-11-01 LAB — HEPATITIS C ANTIBODY: Hep C Virus Ab: <0.1 s/co ratio (ref 0.0–0.9)

## 2019-11-01 LAB — VITAMIN D 25 HYDROXY (VIT D DEFICIENCY, FRACTURES): Vit D, 25-Hydroxy: 42 ng/mL (ref 30.0–100.0)

## 2019-11-07 LAB — TOXASSURE SELECT 13 (MW), URINE

## 2019-11-13 ENCOUNTER — Other Ambulatory Visit: Payer: Self-pay | Admitting: Family

## 2019-11-21 ENCOUNTER — Other Ambulatory Visit: Payer: Self-pay | Admitting: Family

## 2019-11-21 DIAGNOSIS — I10 Essential (primary) hypertension: Secondary | ICD-10-CM

## 2019-11-29 ENCOUNTER — Inpatient Hospital Stay: Payer: Federal, State, Local not specified - PPO | Admitting: Orthopaedic Surgery

## 2019-12-03 ENCOUNTER — Other Ambulatory Visit: Payer: Self-pay | Admitting: Orthopaedic Surgery

## 2019-12-03 DIAGNOSIS — M65331 Trigger finger, right middle finger: Secondary | ICD-10-CM | POA: Diagnosis not present

## 2019-12-03 MED ORDER — HYDROCODONE-ACETAMINOPHEN 5-325 MG PO TABS: 1.0000 | ORAL_TABLET | Freq: Four times a day (QID) | ORAL | 0 refills | Status: DC | PRN

## 2019-12-03 NOTE — Progress Notes (Signed)
norco 5/325 # 10 tabs one po q 6 hrs prn post op pain

## 2019-12-10 ENCOUNTER — Telehealth: Payer: Self-pay | Admitting: Radiology

## 2019-12-10 NOTE — Telephone Encounter (Signed)
Patient left voicemail on my line 12/06/2019 when I was out of the office. She had questions regarding trigger finger release including how much motion you would expect her to have because you told her to move it and if she should continue icing it through the week.   Please advise on questions.  CB for patient is [phone]

## 2019-12-11 NOTE — Telephone Encounter (Signed)
I called discussed , has appt Thursday and she will buddy tape

## 2019-12-13 ENCOUNTER — Encounter: Payer: Self-pay | Admitting: Orthopaedic Surgery

## 2019-12-13 ENCOUNTER — Ambulatory Visit (INDEPENDENT_AMBULATORY_CARE_PROVIDER_SITE_OTHER): Payer: Federal, State, Local not specified - PPO | Admitting: Orthopaedic Surgery

## 2019-12-13 ENCOUNTER — Other Ambulatory Visit: Payer: Self-pay

## 2019-12-13 VITALS — Ht 67.5 in | Wt 182.0 lb

## 2019-12-13 DIAGNOSIS — M65331 Trigger finger, right middle finger: Secondary | ICD-10-CM

## 2019-12-13 NOTE — Progress Notes (Signed)
Post right long trigger finger release.  No further triggering she still has some stiffness of her finger since it had been triggering for 1 entire year.  We discussed buddy taping long finger to ring finger to work on range of motion.  She is happy with the results of surgery sutures removed today return as needed.

## 2019-12-22 ENCOUNTER — Other Ambulatory Visit: Payer: Self-pay | Admitting: Family

## 2019-12-26 IMAGING — DX DG RIBS W/ CHEST 3+V*R*
4 series · 4 of 4 positions shown · non-contrast
Comparison: None.

CLINICAL DATA: Pain following recent fall

EXAM:
RIGHT RIBS AND CHEST - 3+ VIEW

[chest pa]
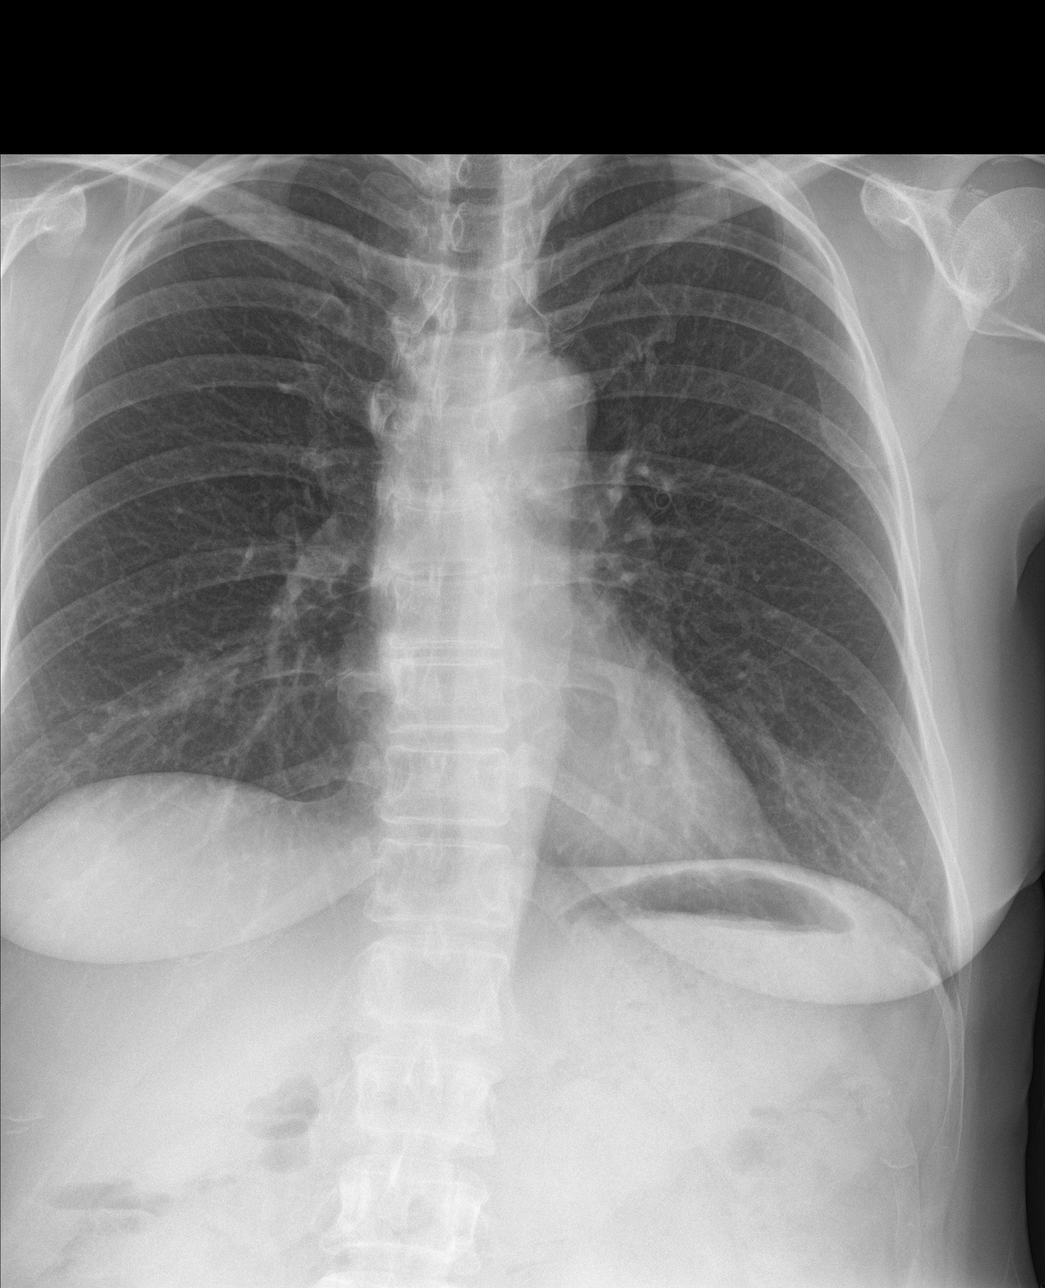

[rib pa]
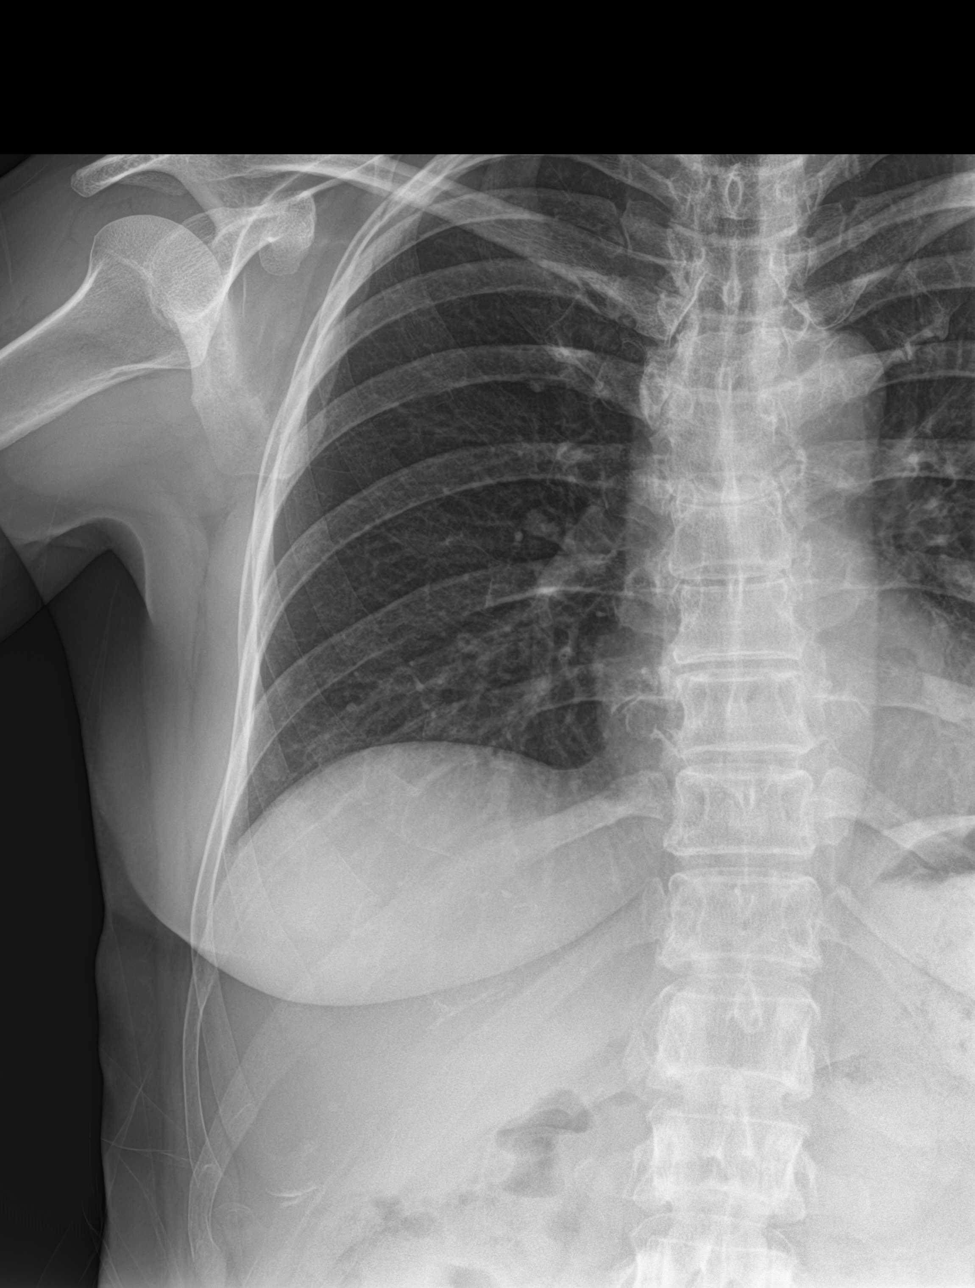

[rib obl (1 of 2)]
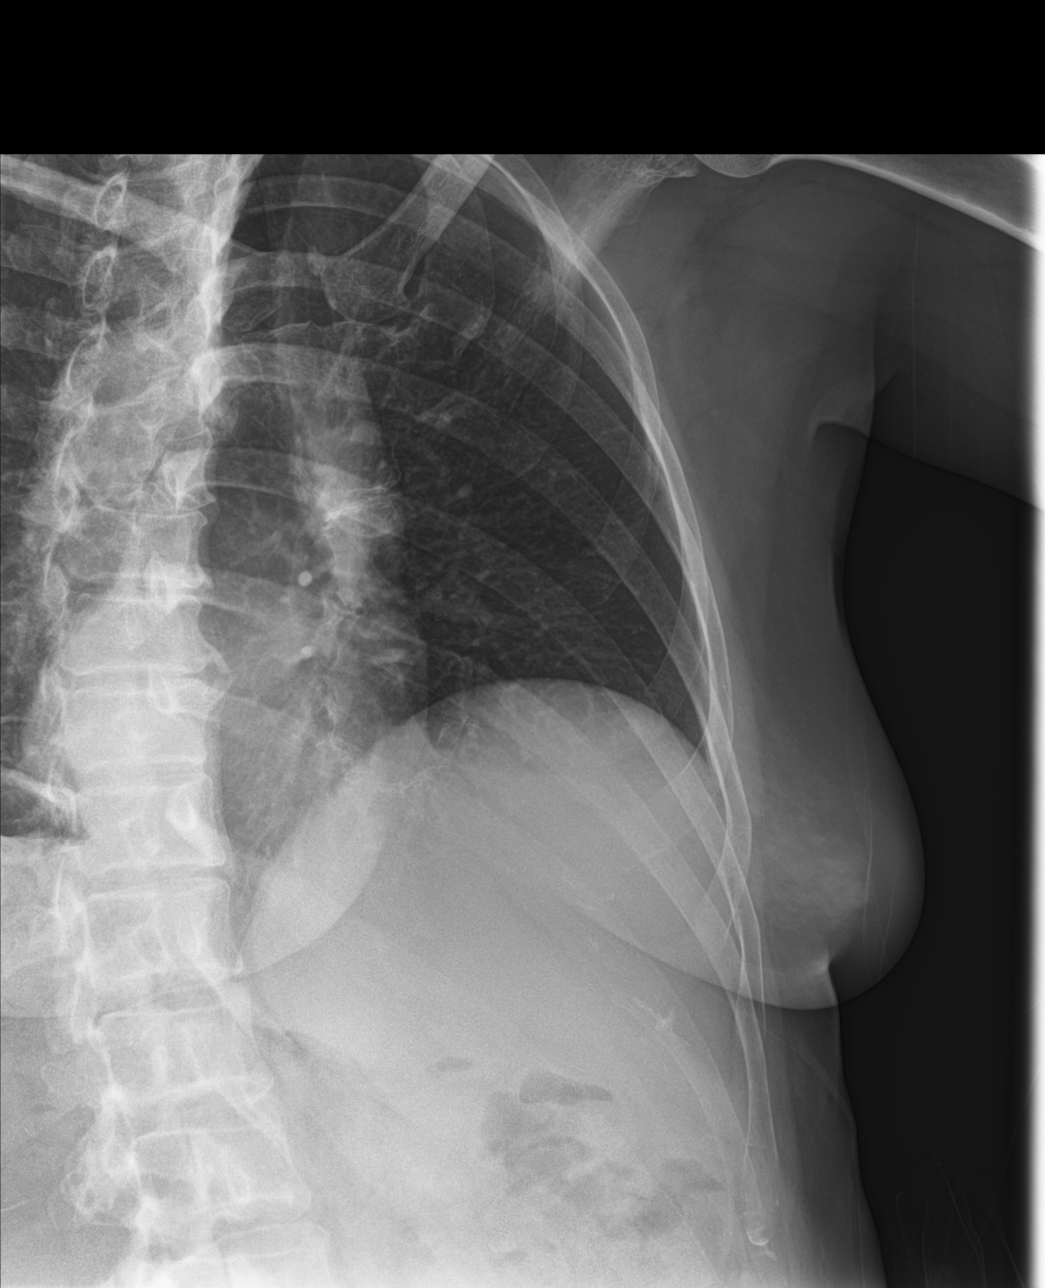

[rib obl (2 of 2)]
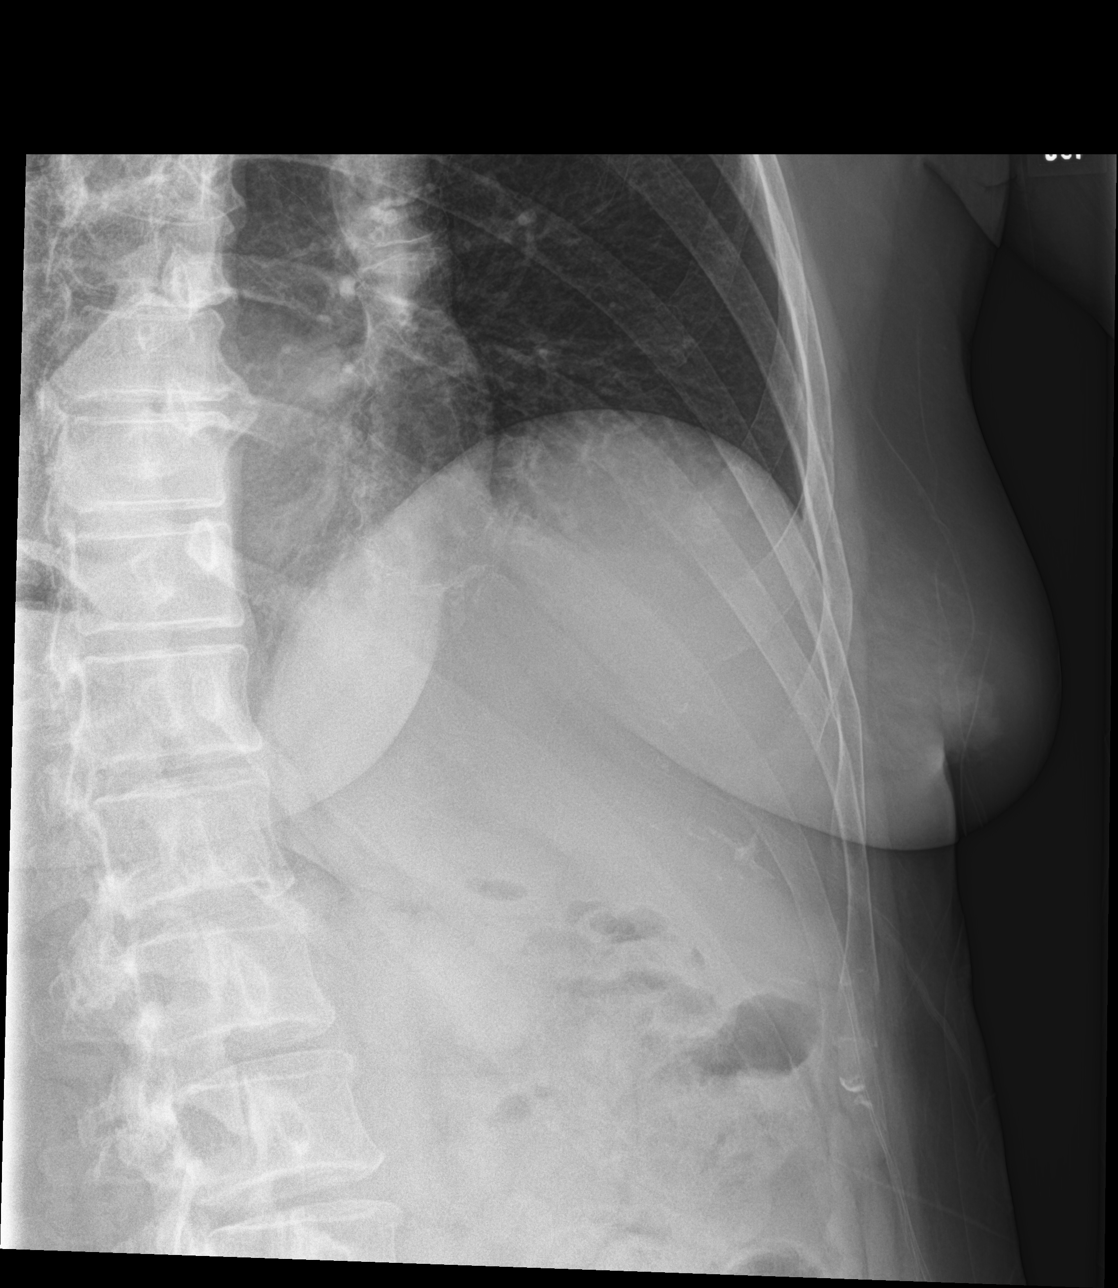

[4 of 4 positions shown; findings below may reference images not displayed]

FINDINGS: Frontal chest as well as oblique and cone-down rib images were
obtained. Lungs are clear. Heart size and pulmonary vascularity are
normal. No adenopathy. There is no demonstrable rib fracture. No
pneumothorax or pleural effusion.
IMPRESSION: No demonstrable rib fracture.  Lungs clear.

## 2019-12-27 DIAGNOSIS — M79673 Pain in unspecified foot: Secondary | ICD-10-CM | POA: Diagnosis not present

## 2019-12-27 DIAGNOSIS — M722 Plantar fascial fibromatosis: Secondary | ICD-10-CM | POA: Diagnosis not present

## 2020-01-13 ENCOUNTER — Other Ambulatory Visit: Payer: Self-pay | Admitting: Family

## 2020-01-17 ENCOUNTER — Other Ambulatory Visit: Payer: Self-pay | Admitting: Family

## 2020-01-31 ENCOUNTER — Ambulatory Visit (INDEPENDENT_AMBULATORY_CARE_PROVIDER_SITE_OTHER): Payer: Federal, State, Local not specified - PPO | Admitting: Family

## 2020-01-31 ENCOUNTER — Encounter: Payer: Self-pay | Admitting: Family

## 2020-01-31 ENCOUNTER — Other Ambulatory Visit: Payer: Self-pay

## 2020-01-31 VITALS — BP 138/86 | HR 78 | Temp 97.4°F | Ht 67.5 in | Wt 192.2 lb

## 2020-01-31 DIAGNOSIS — E785 Hyperlipidemia, unspecified: Secondary | ICD-10-CM

## 2020-01-31 DIAGNOSIS — F411 Generalized anxiety disorder: Secondary | ICD-10-CM

## 2020-01-31 DIAGNOSIS — Z23 Encounter for immunization: Secondary | ICD-10-CM | POA: Diagnosis not present

## 2020-01-31 DIAGNOSIS — M65331 Trigger finger, right middle finger: Secondary | ICD-10-CM | POA: Diagnosis not present

## 2020-01-31 DIAGNOSIS — I10 Essential (primary) hypertension: Secondary | ICD-10-CM | POA: Diagnosis not present

## 2020-01-31 DIAGNOSIS — F331 Major depressive disorder, recurrent, moderate: Secondary | ICD-10-CM

## 2020-01-31 DIAGNOSIS — M797 Fibromyalgia: Secondary | ICD-10-CM | POA: Diagnosis not present

## 2020-01-31 DIAGNOSIS — F1120 Opioid dependence, uncomplicated: Secondary | ICD-10-CM

## 2020-01-31 DIAGNOSIS — E559 Vitamin D deficiency, unspecified: Secondary | ICD-10-CM

## 2020-01-31 DIAGNOSIS — Z0289 Encounter for other administrative examinations: Secondary | ICD-10-CM

## 2020-01-31 MED ORDER — TRAMADOL HCL 50 MG PO TABS: 100.0000 mg | ORAL_TABLET | Freq: Four times a day (QID) | ORAL | 2 refills | Status: DC | PRN

## 2020-01-31 NOTE — Patient Instructions (Signed)
Health Maintenance, Female Adopting a healthy lifestyle and getting preventive care are important in promoting health and wellness. Ask your health care provider about:  The right schedule for you to have regular tests and exams.  Things you can do on your own to prevent diseases and keep yourself healthy. What should I know about diet, weight, and exercise? Eat a healthy diet   Eat a diet that includes plenty of vegetables, fruits, low-fat dairy products, and lean protein.  Do not eat a lot of foods that are high in solid fats, added sugars, or sodium. Maintain a healthy weight Body mass index (BMI) is used to identify weight problems. It estimates body fat based on height and weight. Your health care provider can help determine your BMI and help you achieve or maintain a healthy weight. Get regular exercise Get regular exercise. This is one of the most important things you can do for your health. Most adults should:  Exercise for at least 150 minutes each week. The exercise should increase your heart rate and make you sweat (moderate-intensity exercise).  Do strengthening exercises at least twice a week. This is in addition to the moderate-intensity exercise.  Spend less time sitting. Even light physical activity can be beneficial. Watch cholesterol and blood lipids Have your blood tested for lipids and cholesterol at 55 years of age, then have this test every 5 years. Have your cholesterol levels checked more often if:  Your lipid or cholesterol levels are high.  You are older than 55 years of age.  You are at high risk for heart disease. What should I know about cancer screening? Depending on your health history and family history, you may need to have cancer screening at various ages. This may include screening for:  Breast cancer.  Cervical cancer.  Colorectal cancer.  Skin cancer.  Lung cancer. What should I know about heart disease, diabetes, and high blood  pressure? Blood pressure and heart disease  High blood pressure causes heart disease and increases the risk of stroke. This is more likely to develop in people who have high blood pressure readings, are of African descent, or are overweight.  Have your blood pressure checked: ? Every 3-5 years if you are 18-39 years of age. ? Every year if you are 40 years old or older. Diabetes Have regular diabetes screenings. This checks your fasting blood sugar level. Have the screening done:  Once every three years after age 40 if you are at a normal weight and have a low risk for diabetes.  More often and at a younger age if you are overweight or have a high risk for diabetes. What should I know about preventing infection? Hepatitis B If you have a higher risk for hepatitis B, you should be screened for this virus. Talk with your health care provider to find out if you are at risk for hepatitis B infection. Hepatitis C Testing is recommended for:  Everyone born from 1945 through 1965.  Anyone with known risk factors for hepatitis C. Sexually transmitted infections (STIs)  Get screened for STIs, including gonorrhea and chlamydia, if: ? You are sexually active and are younger than 55 years of age. ? You are older than 55 years of age and your health care provider tells you that you are at risk for this type of infection. ? Your sexual activity has changed since you were last screened, and you are at increased risk for chlamydia or gonorrhea. Ask your health care provider if   you are at risk.  Ask your health care provider about whether you are at high risk for HIV. Your health care provider may recommend a prescription medicine to help prevent HIV infection. If you choose to take medicine to prevent HIV, you should first get tested for HIV. You should then be tested every 3 months for as long as you are taking the medicine. Pregnancy  If you are about to stop having your period (premenopausal) and  you may become pregnant, seek counseling before you get pregnant.  Take 400 to 800 micrograms (mcg) of folic acid every day if you become pregnant.  Ask for birth control (contraception) if you want to prevent pregnancy. Osteoporosis and menopause Osteoporosis is a disease in which the bones lose minerals and strength with aging. This can result in bone fractures. If you are 65 years old or older, or if you are at risk for osteoporosis and fractures, ask your health care provider if you should:  Be screened for bone loss.  Take a calcium or vitamin D supplement to lower your risk of fractures.  Be given hormone replacement therapy (HRT) to treat symptoms of menopause. Follow these instructions at home: Lifestyle  Do not use any products that contain nicotine or tobacco, such as cigarettes, e-cigarettes, and chewing tobacco. If you need help quitting, ask your health care provider.  Do not use street drugs.  Do not share needles.  Ask your health care provider for help if you need support or information about quitting drugs. Alcohol use  Do not drink alcohol if: ? Your health care provider tells you not to drink. ? You are pregnant, may be pregnant, or are planning to become pregnant.  If you drink alcohol: ? Limit how much you use to 0-1 drink a day. ? Limit intake if you are breastfeeding.  Be aware of how much alcohol is in your drink. In the [place] one drink equals one 12 oz bottle of beer (355 mL), one 5 oz glass of wine (148 mL), or one 1 oz glass of hard liquor (44 mL). General instructions  Schedule regular health, dental, and eye exams.  Stay current with your vaccines.  Tell your health care provider if: ? You often feel depressed. ? You have ever been abused or do not feel safe at home. Summary  Adopting a healthy lifestyle and getting preventive care are important in promoting health and wellness.  Follow your health care provider's instructions about healthy  diet, exercising, and getting tested or screened for diseases.  Follow your health care provider's instructions on monitoring your cholesterol and blood pressure. This information is not intended to replace advice given to you by your health care provider. Make sure you discuss any questions you have with your health care provider. Document Revised: 04/12/2018 Document Reviewed: 04/12/2018 Elsevier Patient Education  2020 [employer]  

## 2020-01-31 NOTE — Progress Notes (Signed)
Subjective:    Patient ID: Rita Barry, female    DOB: October 08, 1965, 54 y.o.   MRN: [id number]  Chief Complaint  Patient presents with  . Medical Management of Chronic Issues   PT presents to the office today for chronic follow up and pain mediation. Pt had an abnormal drug screen last time that showed Valium. She brought in her bottle of Valium that was given to her from an ENT for vertigo. She states her prescription bottle was from 2015 and took one prior to our visit for dizziness.  Hypertension This is a chronic problem. The current episode started more than 1 year ago. The problem has been resolved since onset. The problem is controlled. Associated symptoms include anxiety and malaise/fatigue. Pertinent negatives include no peripheral edema or shortness of breath. Risk factors for coronary artery disease include dyslipidemia, obesity and sedentary lifestyle. The current treatment provides moderate improvement. There is no history of kidney disease, CVA or heart failure.  Gastroesophageal Reflux She complains of belching and heartburn. This is a chronic problem. The current episode started more than 1 year ago. The problem occurs occasionally. The problem has been waxing and waning. She has tried a PPI for the symptoms. The treatment provided moderate relief.  Hyperlipidemia This is a chronic problem. The current episode started more than 1 year ago. Exacerbating diseases include obesity. Pertinent negatives include no shortness of breath. Current antihyperlipidemic treatment includes diet change. The current treatment provides mild improvement of lipids. Risk factors for coronary artery disease include a sedentary lifestyle and hypertension.  Anxiety Presents for follow-up visit. Symptoms include depressed mood, dizziness, excessive worry, irritability, nervous/anxious behavior and restlessness. Patient reports no shortness of breath. Symptoms occur occasionally. The severity of symptoms  is moderate. The quality of sleep is good.    Depression        This is a chronic problem.  The current episode started more than 1 year ago.   The onset quality is gradual.   The problem occurs intermittently.  The problem has been waxing and waning since onset.  Associated symptoms include helplessness, hopelessness, irritable, restlessness and sad.  Past medical history includes anxiety.       Review of Systems  Constitutional: Positive for irritability and malaise/fatigue.  Respiratory: Negative for shortness of breath.   Gastrointestinal: Positive for heartburn.  Neurological: Positive for dizziness.  Psychiatric/Behavioral: Positive for depression. The patient is nervous/anxious.   All other systems reviewed and are negative.      Objective:   Physical Exam Vitals reviewed.  Constitutional:      General: She is irritable. She is not in acute distress.    Appearance: She is well-developed.  HENT:     Head: Normocephalic and atraumatic.     Right Ear: Tympanic membrane normal.     Left Ear: Tympanic membrane normal.  Eyes:     Pupils: Pupils are equal, round, and reactive to light.  Neck:     Thyroid: No thyromegaly.  Cardiovascular:     Rate and Rhythm: Normal rate and regular rhythm.     Heart sounds: Normal heart sounds. No murmur heard.   Pulmonary:     Effort: Pulmonary effort is normal. No respiratory distress.     Breath sounds: Normal breath sounds. No wheezing.  Abdominal:     General: Bowel sounds are normal. There is no distension.     Palpations: Abdomen is soft.     Tenderness: There is no abdominal tenderness.  Musculoskeletal:  General: No tenderness. Normal range of motion.     Cervical back: Normal range of motion and neck supple.  Skin:    General: Skin is warm and dry.  Neurological:     Mental Status: She is alert and oriented to person, place, and time.     Cranial Nerves: No cranial nerve deficit.     Deep Tendon Reflexes: Reflexes  are normal and symmetric.  Psychiatric:        Behavior: Behavior normal.        Thought Content: Thought content normal.        Judgment: Judgment normal.       BP 138/86   Pulse 78   Temp (!) 97.4 F (36.3 C)   Ht 5' 7.5" (1.715 m)   Wt 192 lb 3.2 oz (87.2 kg)   LMP 04/16/2016 (Approximate)   SpO2 96%   BMI 29.66 kg/m      Assessment & Plan:  DAISI KENTNER comes in today with chief complaint of Medical Management of Chronic Issues   Diagnosis and orders addressed:  1. Need for immunization against influenza - Flu Vaccine QUAD 36+ mos IM  2. Essential hypertension  3. Trigger finger, right middle finger  4. Moderate episode of recurrent major depressive disorder (HCC)  5. Fibromyalgia - traMADol (ULTRAM) 50 MG tablet; Take 2 tablets (100 mg total) by mouth every 6 (six) hours as needed for severe pain.  Dispense: 120 tablet; Refill: 2  6. GAD (generalized anxiety disorder) - ToxASSURE Select 13 (MW), Urine - traMADol (ULTRAM) 50 MG tablet; Take 2 tablets (100 mg total) by mouth every 6 (six) hours as needed for severe pain.  Dispense: 120 tablet; Refill: 2  7. Hyperlipidemia, unspecified hyperlipidemia type - ToxASSURE Select 13 (MW), Urine  8. Uncomplicated opioid dependence (HCC) - ToxASSURE Select 13 (MW), Urine - traMADol (ULTRAM) 50 MG tablet; Take 2 tablets (100 mg total) by mouth every 6 (six) hours as needed for severe pain.  Dispense: 120 tablet; Refill: 2  9. Pain medication agreement signed - ToxASSURE Select 13 (MW), Urine - traMADol (ULTRAM) 50 MG tablet; Take 2 tablets (100 mg total) by mouth every 6 (six) hours as needed for severe pain.  Dispense: 120 tablet; Refill: 2  10. Vitamin D deficiency   Will get labs on next visit Patient reviewed in [place] controlled database, no flags noted. Contract and drug screen are up to date. Will repeat drug screen today.  Health Maintenance reviewed Diet and exercise encouraged  Follow up plan: 3  months    [name], FNP

## 2020-02-05 LAB — TOXASSURE SELECT 13 (MW), URINE

## 2020-02-12 ENCOUNTER — Other Ambulatory Visit: Payer: Self-pay | Admitting: Family

## 2020-02-12 DIAGNOSIS — I10 Essential (primary) hypertension: Secondary | ICD-10-CM

## 2020-04-09 ENCOUNTER — Other Ambulatory Visit: Payer: Self-pay | Admitting: Family

## 2020-05-05 ENCOUNTER — Encounter: Payer: Federal, State, Local not specified - PPO | Admitting: Family

## 2020-05-16 ENCOUNTER — Other Ambulatory Visit: Payer: Self-pay | Admitting: Family

## 2020-06-02 ENCOUNTER — Encounter: Payer: Self-pay | Admitting: Family

## 2020-06-02 ENCOUNTER — Other Ambulatory Visit (HOSPITAL_COMMUNITY)
Admission: RE | Admit: 2020-06-02 | Discharge: 2020-06-02 | Disposition: A | Discharge status: Home / Self Care | Payer: Federal, State, Local not specified - PPO | Source: Ambulatory Visit | Attending: Family | Admitting: Family

## 2020-06-02 ENCOUNTER — Other Ambulatory Visit: Payer: Self-pay

## 2020-06-02 ENCOUNTER — Ambulatory Visit: Payer: Federal, State, Local not specified - PPO | Admitting: Family

## 2020-06-02 VITALS — BP 146/91 | HR 77 | Temp 96.7°F | Ht 67.5 in | Wt 189.6 lb

## 2020-06-02 DIAGNOSIS — E559 Vitamin D deficiency, unspecified: Secondary | ICD-10-CM | POA: Diagnosis not present

## 2020-06-02 DIAGNOSIS — M797 Fibromyalgia: Secondary | ICD-10-CM | POA: Diagnosis not present

## 2020-06-02 DIAGNOSIS — K2960 Other gastritis without bleeding: Secondary | ICD-10-CM

## 2020-06-02 DIAGNOSIS — Z0000 Encounter for general adult medical examination without abnormal findings: Secondary | ICD-10-CM | POA: Insufficient documentation

## 2020-06-02 DIAGNOSIS — Z01419 Encounter for gynecological examination (general) (routine) without abnormal findings: Secondary | ICD-10-CM

## 2020-06-02 DIAGNOSIS — Z0001 Encounter for general adult medical examination with abnormal findings: Secondary | ICD-10-CM

## 2020-06-02 DIAGNOSIS — F1120 Opioid dependence, uncomplicated: Secondary | ICD-10-CM

## 2020-06-02 DIAGNOSIS — Z01411 Encounter for gynecological examination (general) (routine) with abnormal findings: Secondary | ICD-10-CM

## 2020-06-02 DIAGNOSIS — F331 Major depressive disorder, recurrent, moderate: Secondary | ICD-10-CM

## 2020-06-02 DIAGNOSIS — I10 Essential (primary) hypertension: Secondary | ICD-10-CM

## 2020-06-02 DIAGNOSIS — Z0289 Encounter for other administrative examinations: Secondary | ICD-10-CM

## 2020-06-02 DIAGNOSIS — F411 Generalized anxiety disorder: Secondary | ICD-10-CM | POA: Diagnosis not present

## 2020-06-02 MED ORDER — TRAMADOL HCL 50 MG PO TABS: 100.0000 mg | ORAL_TABLET | Freq: Four times a day (QID) | ORAL | 2 refills | Status: DC | PRN

## 2020-06-02 NOTE — Patient Instructions (Signed)
Health Maintenance, Female Adopting a healthy lifestyle and getting preventive care are important in promoting health and wellness. Ask your health care provider about:  The right schedule for you to have regular tests and exams.  Things you can do on your own to prevent diseases and keep yourself healthy. What should I know about diet, weight, and exercise? Eat a healthy diet  Eat a diet that includes plenty of vegetables, fruits, low-fat dairy products, and lean protein.  Do not eat a lot of foods that are high in solid fats, added sugars, or sodium.   Maintain a healthy weight Body mass index (BMI) is used to identify weight problems. It estimates body fat based on height and weight. Your health care provider can help determine your BMI and help you achieve or maintain a healthy weight. Get regular exercise Get regular exercise. This is one of the most important things you can do for your health. Most adults should:  Exercise for at least 150 minutes each week. The exercise should increase your heart rate and make you sweat (moderate-intensity exercise).  Do strengthening exercises at least twice a week. This is in addition to the moderate-intensity exercise.  Spend less time sitting. Even light physical activity can be beneficial. Watch cholesterol and blood lipids Have your blood tested for lipids and cholesterol at 56 years of age, then have this test every 5 years. Have your cholesterol levels checked more often if:  Your lipid or cholesterol levels are high.  You are older than 56 years of age.  You are at high risk for heart disease. What should I know about cancer screening? Depending on your health history and family history, you may need to have cancer screening at various ages. This may include screening for:  Breast cancer.  Cervical cancer.  Colorectal cancer.  Skin cancer.  Lung cancer. What should I know about heart disease, diabetes, and high blood  pressure? Blood pressure and heart disease  High blood pressure causes heart disease and increases the risk of stroke. This is more likely to develop in people who have high blood pressure readings, are of African descent, or are overweight.  Have your blood pressure checked: ? Every 3-5 years if you are 18-39 years of age. ? Every year if you are 40 years old or older. Diabetes Have regular diabetes screenings. This checks your fasting blood sugar level. Have the screening done:  Once every three years after age 40 if you are at a normal weight and have a low risk for diabetes.  More often and at a younger age if you are overweight or have a high risk for diabetes. What should I know about preventing infection? Hepatitis B If you have a higher risk for hepatitis B, you should be screened for this virus. Talk with your health care provider to find out if you are at risk for hepatitis B infection. Hepatitis C Testing is recommended for:  Everyone born from 1945 through 1965.  Anyone with known risk factors for hepatitis C. Sexually transmitted infections (STIs)  Get screened for STIs, including gonorrhea and chlamydia, if: ? You are sexually active and are younger than 56 years of age. ? You are older than 56 years of age and your health care provider tells you that you are at risk for this type of infection. ? Your sexual activity has changed since you were last screened, and you are at increased risk for chlamydia or gonorrhea. Ask your health care provider   if you are at risk.  Ask your health care provider about whether you are at high risk for HIV. Your health care provider may recommend a prescription medicine to help prevent HIV infection. If you choose to take medicine to prevent HIV, you should first get tested for HIV. You should then be tested every 3 months for as long as you are taking the medicine. Pregnancy  If you are about to stop having your period (premenopausal) and  you may become pregnant, seek counseling before you get pregnant.  Take 400 to 800 micrograms (mcg) of folic acid every day if you become pregnant.  Ask for birth control (contraception) if you want to prevent pregnancy. Osteoporosis and menopause Osteoporosis is a disease in which the bones lose minerals and strength with aging. This can result in bone fractures. If you are 65 years old or older, or if you are at risk for osteoporosis and fractures, ask your health care provider if you should:  Be screened for bone loss.  Take a calcium or vitamin D supplement to lower your risk of fractures.  Be given hormone replacement therapy (HRT) to treat symptoms of menopause. Follow these instructions at home: Lifestyle  Do not use any products that contain nicotine or tobacco, such as cigarettes, e-cigarettes, and chewing tobacco. If you need help quitting, ask your health care provider.  Do not use street drugs.  Do not share needles.  Ask your health care provider for help if you need support or information about quitting drugs. Alcohol use  Do not drink alcohol if: ? Your health care provider tells you not to drink. ? You are pregnant, may be pregnant, or are planning to become pregnant.  If you drink alcohol: ? Limit how much you use to 0-1 drink a day. ? Limit intake if you are breastfeeding.  Be aware of how much alcohol is in your drink. In the [place] one drink equals one 12 oz bottle of beer (355 mL), one 5 oz glass of wine (148 mL), or one 1 oz glass of hard liquor (44 mL). General instructions  Schedule regular health, dental, and eye exams.  Stay current with your vaccines.  Tell your health care provider if: ? You often feel depressed. ? You have ever been abused or do not feel safe at home. Summary  Adopting a healthy lifestyle and getting preventive care are important in promoting health and wellness.  Follow your health care provider's instructions about healthy  diet, exercising, and getting tested or screened for diseases.  Follow your health care provider's instructions on monitoring your cholesterol and blood pressure. This information is not intended to replace advice given to you by your health care provider. Make sure you discuss any questions you have with your health care provider. Document Revised: 04/12/2018 Document Reviewed: 04/12/2018 Elsevier Patient Education  2021 Elsevier Inc.  

## 2020-06-02 NOTE — Progress Notes (Addendum)
Subjective:    Patient ID: Rita Barry, female    DOB: 11-25-1965, 55 y.o.   MRN: [id number]  Chief Complaint  Patient presents with  . Annual Exam    With pap patient also wants labs    PT presents to the office today for CPE and pap and pain mediation. Hypertension This is a chronic problem. The current episode started more than 1 year ago. The problem has been resolved since onset. The problem is controlled. Associated symptoms include anxiety. Pertinent negatives include no malaise/fatigue, peripheral edema or shortness of breath. Risk factors for coronary artery disease include dyslipidemia and sedentary lifestyle. The current treatment provides moderate improvement.  Gastroesophageal Reflux She complains of belching and heartburn. This is a chronic problem. The current episode started more than 1 year ago. The problem occurs rarely. The problem has been resolved. She has tried a diet change for the symptoms. The treatment provided moderate relief.  Depression        This is a chronic problem.  The current episode started more than 1 year ago.   The problem occurs intermittently.  Associated symptoms include no hopelessness, not irritable and not sad.  Past treatments include SSRIs - Selective serotonin reuptake inhibitors.  Past medical history includes anxiety.   Anxiety Presents for follow-up visit. Symptoms include excessive worry and irritability. Patient reports no shortness of breath. Symptoms occur occasionally. The severity of symptoms is moderate.    Hyperlipidemia This is a chronic problem. The current episode started more than 1 year ago. The problem is uncontrolled. Exacerbating diseases include obesity. Pertinent negatives include no shortness of breath. Current antihyperlipidemic treatment includes diet change. The current treatment provides mild improvement of lipids. Risk factors for coronary artery disease include dyslipidemia, post-menopausal and a sedentary  lifestyle.  Gynecologic Exam The patient's pertinent negatives include no genital lesions or missed menses. This is a chronic problem. The current episode started more than 1 year ago. The problem occurs intermittently. The problem has been waxing and waning. Pertinent negatives include no dysuria or painful intercourse.  Fibromyalgia  Pt reports generalized aching pain of 5 out 10. She takes one Ultram a day that helps.     Review of Systems  Constitutional: Positive for irritability. Negative for malaise/fatigue.  Respiratory: Negative for shortness of breath.   Gastrointestinal: Positive for heartburn.  Genitourinary: Negative for dysuria and missed menses.  Psychiatric/Behavioral: Positive for depression.  All other systems reviewed and are negative.  Family History  Problem Relation Age of Onset  . Cancer Father        Throat  . Hypertension Mother   . Breast cancer Mother   . Diabetes Maternal Grandmother    Social History   Socioeconomic History  . Marital status: Married    Spouse name: Not on file  . Number of children: Not on file  . Years of education: Not on file  . Highest education level: Not on file  Occupational History  . Occupation: Works with Horses  Tobacco Use  . Smoking status: Never Smoker  . Smokeless tobacco: Never Used  Vaping Use  . Vaping Use: Never used  Substance and Sexual Activity  . Alcohol use: No    Comment: quit drinking 3 weeks ago  . Drug use: No  . Sexual activity: Yes  Other Topics Concern  . Not on file  Social History Narrative  . Not on file   Social Determinants of Health   Financial Resource Strain: Not on  file  Food Insecurity: Not on file  Transportation Needs: Not on file  Physical Activity: Not on file  Stress: Not on file  Social Connections: Not on file       Objective:   Physical Exam Vitals reviewed.  Constitutional:      General: She is not irritable.She is not in acute distress.    Appearance: She  is well-developed and well-nourished. She is obese.  HENT:     Head: Normocephalic and atraumatic.     Right Ear: Tympanic membrane normal.     Left Ear: Tympanic membrane normal.     Mouth/Throat:     Mouth: Oropharynx is clear and moist.  Eyes:     Pupils: Pupils are equal, round, and reactive to light.  Neck:     Thyroid: No thyromegaly.  Cardiovascular:     Rate and Rhythm: Normal rate and regular rhythm.     Pulses: Intact distal pulses.     Heart sounds: Normal heart sounds. No murmur heard.   Pulmonary:     Effort: Pulmonary effort is normal. No respiratory distress.     Breath sounds: Normal breath sounds. No wheezing.  Chest:  Breasts:     Right: No swelling, bleeding, inverted nipple, mass, nipple discharge, skin change or tenderness.     Left: No swelling, bleeding, inverted nipple, mass, nipple discharge, skin change or tenderness.    Abdominal:     General: Bowel sounds are normal. There is no distension.     Palpations: Abdomen is soft.     Tenderness: There is no abdominal tenderness.  Genitourinary:    Comments: .Bimanual exam- no adnexal masses or tenderness, ovaries nonpalpable   Cervix parous and pink- No discharge  Musculoskeletal:        General: No tenderness or edema. Normal range of motion.     Cervical back: Normal range of motion and neck supple.  Skin:    General: Skin is warm and dry.  Neurological:     Mental Status: She is alert and oriented to person, place, and time.     Cranial Nerves: No cranial nerve deficit.     Deep Tendon Reflexes: Reflexes are normal and symmetric.  Psychiatric:        Mood and Affect: Mood and affect normal.        Behavior: Behavior normal.        Thought Content: Thought content normal.        Judgment: Judgment normal.       BP (!) 146/91   Pulse 77   Temp (!) 96.7 F (35.9 C) (Temporal)   Ht 5' 7.5" (1.715 m)   Wt 189 lb 9.6 oz (86 kg)   LMP 04/16/2016 (Approximate)   BMI 29.26 kg/m       Assessment & Plan:  ZELLIE JENNING comes in today with chief complaint of Annual Exam (With pap patient also wants labs )   Diagnosis and orders addressed:  1. Fibromyalgia - traMADol (ULTRAM) 50 MG tablet; Take 2 tablets (100 mg total) by mouth every 6 (six) hours as needed for severe pain.  Dispense: 90 tablet; Refill: 2 - CBC with Differential/Platelet - BMP8+EGFR  2. GAD (generalized anxiety disorder) - traMADol (ULTRAM) 50 MG tablet; Take 2 tablets (100 mg total) by mouth every 6 (six) hours as needed for severe pain.  Dispense: 90 tablet; Refill: 2 - CBC with Differential/Platelet - BMP8+EGFR  3. Uncomplicated opioid dependence (HCC) - traMADol (ULTRAM) 50 MG tablet;  Take 2 tablets (100 mg total) by mouth every 6 (six) hours as needed for severe pain.  Dispense: 90 tablet; Refill: 2 - CBC with Differential/Platelet - BMP8+EGFR  4. Pain medication agreement signed - traMADol (ULTRAM) 50 MG tablet; Take 2 tablets (100 mg total) by mouth every 6 (six) hours as needed for severe pain.  Dispense: 90 tablet; Refill: 2 - CBC with Differential/Platelet - BMP8+EGFR  5. Essential hypertension - CBC with Differential/Platelet - BMP8+EGFR  6. Erosive gastritis - CBC with Differential/Platelet - BMP8+EGFR  7. Moderate episode of recurrent major depressive disorder (HCC) - CBC with Differential/Platelet - BMP8+EGFR  8. Vitamin D deficiency - CBC with Differential/Platelet - BMP8+EGFR - Cytology - PAP([place])  9. Annual physical exam  - CBC with Differential/Platelet - BMP8+EGFR - Lipid panel - Hepatic function panel - TSH - VITAMIN D 25 Hydroxy (Vit-D Deficiency, Fractures) - Cytology - PAP([place])  10. Gynecologic exam normal - CBC with Differential/Platelet - BMP8+EGFR - Cytology - PAP([place])   Labs pending Patient reviewed in [place] controlled database, no flags noted. Contract and drug screen are up to date.  Health Maintenance reviewed Diet  and exercise encouraged  Follow up plan: 3 months    [name], FNP

## 2020-06-03 ENCOUNTER — Other Ambulatory Visit: Payer: Self-pay | Admitting: Family

## 2020-06-03 DIAGNOSIS — R748 Abnormal levels of other serum enzymes: Secondary | ICD-10-CM

## 2020-06-03 LAB — HEPATIC FUNCTION PANEL
ALT: 46 IU/L — ABNORMAL HIGH (ref 0–32)
AST: 51 IU/L — ABNORMAL HIGH (ref 0–40)
Albumin: 4.6 g/dL (ref 3.8–4.9)
Alkaline Phosphatase: 36 IU/L — ABNORMAL LOW (ref 44–121)
Bilirubin Total: 0.3 mg/dL (ref 0.0–1.2)
Bilirubin, Direct: 0.13 mg/dL (ref 0.00–0.40)
Total Protein: 7.3 g/dL (ref 6.0–8.5)

## 2020-06-03 LAB — CBC WITH DIFFERENTIAL/PLATELET
Basophils Absolute: 0.1 10*3/uL (ref 0.0–0.2)
Basos: 2 %
EOS (ABSOLUTE): 0.8 10*3/uL — ABNORMAL HIGH (ref 0.0–0.4)
Eos: 16 %
Hematocrit: 40.7 % (ref 34.0–46.6)
Hemoglobin: 13.7 g/dL (ref 11.1–15.9)
Immature Grans (Abs): 0 10*3/uL (ref 0.0–0.1)
Immature Granulocytes: 0 %
Lymphocytes Absolute: 1.6 10*3/uL (ref 0.7–3.1)
Lymphs: 30 %
MCH: 33.4 pg — ABNORMAL HIGH (ref 26.6–33.0)
MCHC: 33.7 g/dL (ref 31.5–35.7)
MCV: 99 fL — ABNORMAL HIGH (ref 79–97)
Monocytes Absolute: 0.4 10*3/uL (ref 0.1–0.9)
Monocytes: 8 %
Neutrophils Absolute: 2.4 10*3/uL (ref 1.4–7.0)
Neutrophils: 44 %
Platelets: 330 10*3/uL (ref 150–450)
RBC: 4.1 x10E6/uL (ref 3.77–5.28)
RDW: 12.3 % (ref 11.7–15.4)
WBC: 5.3 10*3/uL (ref 3.4–10.8)

## 2020-06-03 LAB — BMP8+EGFR
BUN/Creatinine Ratio: 17 (ref 9–23)
BUN: 15 mg/dL (ref 6–24)
CO2: 24 mmol/L (ref 20–29)
Calcium: 9.7 mg/dL (ref 8.7–10.2)
Chloride: 94 mmol/L — ABNORMAL LOW (ref 96–106)
Creatinine, Ser: 0.87 mg/dL (ref 0.57–1.00)
GFR calc Af Amer: 87 mL/min/{1.73_m2} (ref >59)
GFR calc non Af Amer: 75 mL/min/{1.73_m2} (ref >59)
Glucose: 93 mg/dL (ref 65–99)
Potassium: 4.7 mmol/L (ref 3.5–5.2)
Sodium: 136 mmol/L (ref 134–144)

## 2020-06-03 LAB — CYTOLOGY - PAP: Diagnosis: NEGATIVE

## 2020-06-03 LAB — VITAMIN D 25 HYDROXY (VIT D DEFICIENCY, FRACTURES): Vit D, 25-Hydroxy: 29.2 ng/mL — ABNORMAL LOW (ref 30.0–100.0)

## 2020-06-03 LAB — LIPID PANEL
Chol/HDL Ratio: 4 ratio (ref 0.0–4.4)
Cholesterol, Total: 327 mg/dL — ABNORMAL HIGH (ref 100–199)
HDL: 82 mg/dL (ref >39)
LDL Chol Calc (NIH): 219 mg/dL — ABNORMAL HIGH (ref 0–99)
Triglycerides: 142 mg/dL (ref 0–149)
VLDL Cholesterol Cal: 26 mg/dL (ref 5–40)

## 2020-06-03 LAB — TSH: TSH: 1.97 u[IU]/mL (ref 0.450–4.500)

## 2020-06-03 MED ORDER — ROSUVASTATIN CALCIUM 5 MG PO TABS: 5.0000 mg | ORAL_TABLET | Freq: Every day | ORAL | 3 refills | Status: DC

## 2020-06-03 MED ORDER — VITAMIN D (ERGOCALCIFEROL) 1.25 MG (50000 UNIT) PO CAPS: 50000.0000 [IU] | ORAL_CAPSULE | ORAL | 3 refills | Status: DC

## 2020-06-04 ENCOUNTER — Other Ambulatory Visit: Payer: Self-pay | Admitting: Family

## 2020-06-04 ENCOUNTER — Telehealth: Payer: Self-pay | Admitting: Family

## 2020-06-10 ENCOUNTER — Other Ambulatory Visit: Payer: Self-pay

## 2020-06-10 ENCOUNTER — Ambulatory Visit (HOSPITAL_COMMUNITY)
Admission: RE | Admit: 2020-06-10 | Discharge: 2020-06-10 | Disposition: A | Discharge status: Home / Self Care | Payer: Federal, State, Local not specified - PPO | Source: Ambulatory Visit | Attending: Family | Admitting: Family

## 2020-06-10 DIAGNOSIS — R748 Abnormal levels of other serum enzymes: Secondary | ICD-10-CM | POA: Diagnosis not present

## 2020-06-10 DIAGNOSIS — K7689 Other specified diseases of liver: Secondary | ICD-10-CM | POA: Diagnosis not present

## 2020-06-10 DIAGNOSIS — R7989 Other specified abnormal findings of blood chemistry: Secondary | ICD-10-CM | POA: Diagnosis not present

## 2020-07-04 ENCOUNTER — Other Ambulatory Visit: Payer: Self-pay | Admitting: Family

## 2020-09-02 ENCOUNTER — Ambulatory Visit: Payer: Federal, State, Local not specified - PPO | Admitting: Family

## 2020-09-03 ENCOUNTER — Encounter: Payer: Self-pay | Admitting: Family

## 2020-09-03 ENCOUNTER — Ambulatory Visit (INDEPENDENT_AMBULATORY_CARE_PROVIDER_SITE_OTHER): Payer: Federal, State, Local not specified - PPO | Admitting: Family

## 2020-09-03 DIAGNOSIS — F1120 Opioid dependence, uncomplicated: Secondary | ICD-10-CM

## 2020-09-03 DIAGNOSIS — Z0289 Encounter for other administrative examinations: Secondary | ICD-10-CM

## 2020-09-03 DIAGNOSIS — I10 Essential (primary) hypertension: Secondary | ICD-10-CM

## 2020-09-03 DIAGNOSIS — F331 Major depressive disorder, recurrent, moderate: Secondary | ICD-10-CM | POA: Diagnosis not present

## 2020-09-03 DIAGNOSIS — F411 Generalized anxiety disorder: Secondary | ICD-10-CM

## 2020-09-03 DIAGNOSIS — E785 Hyperlipidemia, unspecified: Secondary | ICD-10-CM

## 2020-09-03 DIAGNOSIS — E559 Vitamin D deficiency, unspecified: Secondary | ICD-10-CM

## 2020-09-03 DIAGNOSIS — M797 Fibromyalgia: Secondary | ICD-10-CM

## 2020-09-03 MED ORDER — TRAMADOL HCL 50 MG PO TABS: 100.0000 mg | ORAL_TABLET | Freq: Four times a day (QID) | ORAL | 2 refills | Status: DC | PRN

## 2020-09-03 NOTE — Progress Notes (Signed)
Virtual Visit  Note Due to COVID-19 pandemic this visit was conducted virtually. This visit type was conducted due to national recommendations for restrictions regarding the COVID-19 Pandemic (e.g. social distancing, sheltering in place) in an effort to limit this patient's exposure and mitigate transmission in our community. All issues noted in this document were discussed and addressed.  A physical exam was not performed with this format.  I connected with Rita Barry on 09/03/20 at 9:05 AM  by telephone and verified that I am speaking with the correct person using two identifiers. Rita Barry is currently located at home and husband  is currently with her during visit. The provider, [name], FNP is located in their office at time of visit.  I discussed the limitations, risks, security and privacy concerns of performing an evaluation and management service by telephone and the availability of in person appointments. I also discussed with the patient that there may be a patient responsible charge related to this service. The patient expressed understanding and agreed to proceed.   History and Present Illness:  PT presents to the office today for chronic follow up and pain mediation. She has fibromyalgia that is stable. [place] and states she has generalized pain in the evenings of 7 out 10.  Hypertension This is a chronic problem. The current episode started more than 1 year ago. The problem has been resolved since onset. The problem is controlled. Associated symptoms include anxiety. Pertinent negatives include no malaise/fatigue, peripheral edema or shortness of breath. Risk factors for coronary artery disease include dyslipidemia and sedentary lifestyle.  Anxiety Presents for follow-up visit. Symptoms include excessive worry, irritability and restlessness. Patient reports no shortness of breath. Symptoms occur occasionally. The severity of symptoms  is moderate.   Her past medical history is significant for asthma.  Depression        This is a chronic problem.  The current episode started more than 1 year ago.   The onset quality is gradual.   The problem occurs intermittently.  Associated symptoms include restlessness.  Associated symptoms include no helplessness, no hopelessness and not irritable.  Past medical history includes anxiety.   Hyperlipidemia This is a chronic problem. The current episode started more than 1 year ago. The problem is controlled. Pertinent negatives include no shortness of breath. Current antihyperlipidemic treatment includes statins. Risk factors for coronary artery disease include dyslipidemia, hypertension and a sedentary lifestyle.  Asthma She complains of wheezing. There is no cough, difficulty breathing or shortness of breath. This is a chronic problem. The current episode started more than 1 year ago. The problem occurs intermittently. The problem has been waxing and waning. Associated symptoms include rhinorrhea. Pertinent negatives include no malaise/fatigue. She reports minimal improvement on treatment. Her past medical history is significant for asthma.      Review of Systems  Constitutional: Positive for irritability. Negative for malaise/fatigue.  HENT: Positive for rhinorrhea.   Respiratory: Positive for wheezing. Negative for cough and shortness of breath.   Psychiatric/Behavioral: Positive for depression.  All other systems reviewed and are negative.    Observations/Objective: No SOB or distress noted   Assessment and Plan: 1. Essential hypertension  2. Moderate episode of recurrent major depressive disorder (HCC)  3. GAD (generalized anxiety disorder - traMADol (ULTRAM) 50 MG tablet; Take 2 tablets (100 mg total) by mouth every 6 (six) hours as needed for severe pain.  Dispense: 90 tablet; Refill: 2  4. Hyperlipidemia, unspecified hyperlipidemia type  5. Pain medication agreement  signed - traMADol (ULTRAM) 50 MG tablet; Take 2 tablets (100 mg total) by mouth every 6 (six) hours as needed for severe pain.  Dispense: 90 tablet; Refill: 2  6. Uncomplicated opioid dependence (HCC) - traMADol (ULTRAM) 50 MG tablet; Take 2 tablets (100 mg total) by mouth every 6 (six) hours as needed for severe pain.  Dispense: 90 tablet; Refill: 2  7. Fibromyalgia - traMADol (ULTRAM) 50 MG tablet; Take 2 tablets (100 mg total) by mouth every 6 (six) hours as needed for severe pain.  Dispense: 90 tablet; Refill: 2  8. Vitamin D deficiency  Patient reviewed in [place] controlled database, no flags noted. Contract and drug screen are up to date.  Continue medications  RTO in 3 months      I discussed the assessment and treatment plan with the patient. The patient was provided an opportunity to ask questions and all were answered. The patient agreed with the plan and demonstrated an understanding of the instructions.   The patient was advised to call back or seek an in-person evaluation if the symptoms worsen or if the condition fails to improve as anticipated.  The above assessment and management plan was discussed with the patient. The patient verbalized understanding of and has agreed to the management plan. Patient is aware to call the clinic if symptoms persist or worsen. Patient is aware when to return to the clinic for a follow-up visit. Patient educated on when it is appropriate to go to the emergency department.   Time call ended:  9:18 AM   I provided 13 minutes of  non face-to-face time during this encounter.    [name], FNP

## 2020-09-28 ENCOUNTER — Other Ambulatory Visit: Payer: Self-pay | Admitting: Family

## 2020-09-28 DIAGNOSIS — I10 Essential (primary) hypertension: Secondary | ICD-10-CM

## 2020-09-30 ENCOUNTER — Other Ambulatory Visit: Payer: Self-pay | Admitting: Family

## 2020-11-29 ENCOUNTER — Other Ambulatory Visit: Payer: Self-pay | Admitting: Family

## 2020-12-09 ENCOUNTER — Encounter: Payer: Self-pay | Admitting: Family

## 2020-12-09 ENCOUNTER — Telehealth: Payer: Federal, State, Local not specified - PPO | Admitting: Family

## 2020-12-09 DIAGNOSIS — R1032 Left lower quadrant pain: Secondary | ICD-10-CM

## 2020-12-09 DIAGNOSIS — K5792 Diverticulitis of intestine, part unspecified, without perforation or abscess without bleeding: Secondary | ICD-10-CM

## 2020-12-09 MED ORDER — CIPROFLOXACIN HCL 500 MG PO TABS: 500.0000 mg | ORAL_TABLET | Freq: Two times a day (BID) | ORAL | 0 refills | Status: DC

## 2020-12-09 MED ORDER — METRONIDAZOLE 500 MG PO TABS: 500.0000 mg | ORAL_TABLET | Freq: Two times a day (BID) | ORAL | 0 refills | Status: DC

## 2020-12-09 MED ORDER — ONDANSETRON HCL 4 MG PO TABS: 4.0000 mg | ORAL_TABLET | Freq: Three times a day (TID) | ORAL | 0 refills | Status: DC | PRN

## 2020-12-09 NOTE — Progress Notes (Signed)
Virtual Visit Consent   Rita Barry, you are scheduled for a virtual visit with a [facility] provider today.     Just as with appointments in the office, your consent must be obtained to participate.  Your consent will be active for this visit and any virtual visit you may have with one of our providers in the next 365 days.     If you have a MyChart account, a copy of this consent can be sent to you electronically.  All virtual visits are billed to your insurance company just like a traditional visit in the office.    As this is a virtual visit, video technology does not allow for your provider to perform a traditional examination.  This may limit your provider's ability to fully assess your condition.  If your provider identifies any concerns that need to be evaluated in person or the need to arrange testing (such as labs, EKG, etc.), we will make arrangements to do so.     Although advances in technology are sophisticated, we cannot ensure that it will always work on either your end or our end.  If the connection with a video visit is poor, the visit may have to be switched to a telephone visit.  With either a video or telephone visit, we are not always able to ensure that we have a secure connection.     I need to obtain your verbal consent now.   Are you willing to proceed with your visit today?    Rita Barry has provided verbal consent on 12/09/2020 for a virtual visit (video or telephone).   [name], FNP   Date: 12/09/2020 9:22 AM   Virtual Visit via Video Note   I, [name], connected with  Rita Barry  ([id number], [birth year]) on 12/09/20 at  8:40 AM EDT by a video-enabled telemedicine application and verified that I am speaking with the correct person using two identifiers.  Location: Patient: Virtual Visit Location Patient: Home Provider: Virtual Visit Location Provider: Office/Clinic   I discussed the limitations of evaluation and management by  telemedicine and the availability of in person appointments. The patient expressed understanding and agreed to proceed.    History of Present Illness: Rita Barry is a 55 y.o. who identifies as a female who was assigned female at birth, and is being seen today abdominal pain.   HPI: Abdominal Pain This is a new problem. The current episode started in the past 7 days. The onset quality is gradual. The pain is located in the LLQ. The pain is at a severity of 6/10. The pain is moderate. The abdominal pain does not radiate. Associated symptoms include nausea. Pertinent negatives include no belching, constipation, diarrhea, dysuria, frequency, hematuria or vomiting. The pain is aggravated by eating. Relieved by: laying down. She has tried antacids for the symptoms. The treatment provided mild relief.   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 Has patient had a PCN reaction causing immediate rash, facial/tongue/throat swelling, SOB or lightheadedness with hypotension: Yes Has patient had a PCN reaction causing severe rash involving mucus membranes or skin necrosis: No Has patient had a PCN reaction that required hospitalization: No Has patient had a PCN reaction  occurring within the last 10 years: No If all of the above answers are "NO", then may proceed with Ce[facility]os   Medications:  Current Outpatient Medications:    ciprofloxacin (CIPRO) 500 MG tablet, Take 1 tablet (500 mg total) by mouth 2 (two) times daily., Disp: 14 tablet, Rfl: 0   metroNIDAZOLE (FLAGYL) 500 MG tablet, Take 1 tablet (500 mg total) by mouth 2 (two) times daily., Disp: 14 tablet, Rfl: 0   ondansetron (ZOFRAN) 4 MG tablet, Take 1 tablet (4 mg total) by mouth every 8 (eight) hours as needed for nausea or vomiting., Disp: 20 tablet, Rfl: 0   albuterol (VENTOLIN HFA) 108 (90 Base) MCG/ACT inhaler, TAKE 2 PUFFS BY MOUTH EVERY 6 HOURS AS NEEDED FOR WHEEZE OR SHORTNESS OF BREATH, Disp: 18 each, Rfl: 2   escitalopram (LEXAPRO) 20 MG tablet, TAKE 1 TABLET BY MOUTH EVERY DAY, Disp: 90 tablet, Rfl: 0   fluticasone (FLONASE) 50 MCG/ACT nasal spray, PLACE 2 SPRAYS INTO THE NOSE DAILY., Disp: 48 mL, Rfl: 1   GARLIC PO, Take 1 tablet by mouth daily., Disp: , Rfl:    hydrochlorothiazide (MICROZIDE) 12.5 MG capsule, TAKE 1 CAPSULE BY MOUTH EVERY DAY, Disp: 90 capsule, Rfl: 1   Magnesium 250 MG TABS, Take 250 mg by mouth at bedtime. , Disp: , Rfl:    Multiple Vitamins-Minerals (WOMENS MULTI VITAMIN & MINERAL PO), Take 1 tablet by mouth daily., Disp: , Rfl:    Probiotic Product (PHILLIPS COLON HEALTH PO), Take 1 tablet by mouth daily. (Patient not taking: Reported on 06/02/2020), Disp: , Rfl:    rosuvastatin (CRESTOR) 5 MG tablet, Take 1 tablet (5 mg total) by mouth daily., Disp: 90 tablet, Rfl: 3   traMADol (ULTRAM) 50 MG tablet, Take 2 tablets (100 mg total) by mouth every 6 (six) hours as needed for severe pain., Disp: 90 tablet, Rfl: 2   Vitamin D, Cholecalciferol, 1000 units CAPS, Take 1,000 Units by mouth daily. , Disp: , Rfl:    Vitamin D, Ergocalciferol, (DRISDOL) 1.25 MG (50000 UNIT) CAPS capsule, Take 1 capsule (50,000 Units total) by mouth every 7 (seven) days., Disp: 12 capsule, Rfl:  3  Observations/Objective: Patient is well-developed, well-nourished in no acute distress.  Resting comfortably  at home.  Head is normocephalic, atraumatic.  No labored breathing.  Speech is clear and coherent with logical content.  Patient is alert and oriented at baseline.  Pt pushes on abdominal area and moderate pain in LLQ.   Assessment and Plan: 1. Left lower quadrant abdominal pain  2. Diverticulitis - metroNIDAZOLE (FLAGYL) 500 MG tablet; Take 1 tablet (500 mg total) by mouth 2 (two) times daily.  Dispense: 14 tablet; Refill: 0 - ciprofloxacin (CIPRO) 500 MG tablet; Take 1 tablet (500 mg total) by mouth 2 (two) times daily.  Dispense: 14 tablet; Refill: 0 - ondansetron (ZOFRAN) 4 MG tablet; Take 1 tablet (4 mg total) by mouth every 8 (eight) hours as needed for nausea or vomiting.  Dispense: 20 tablet; Refill: 0 Full liquid diet Avoid red colored foods If abdominal pain worsens  go to ED. If pain is not improving in next 24-48 hours call office and may need CT scan.  Zofran as needed  Follow Up Instructions: I discussed the assessment and treatment plan with the patient. The patient was provided an opportunity to ask questions and all were answered. The patient agreed with the plan and demonstrated an understanding of the instructions.  A copy of instructions were sent to the patient via MyChart.  The patient was advised to call back or seek an in-person evaluation if the symptoms worsen or if the condition fails to improve as anticipated.  Time:  I spent 28 minutes with the patient via telehealth technology discussing the above problems/concerns.    [name], FNP

## 2020-12-15 ENCOUNTER — Telehealth: Payer: Self-pay | Admitting: Family

## 2020-12-15 NOTE — Telephone Encounter (Signed)
Left detailed message.   

## 2020-12-16 ENCOUNTER — Ambulatory Visit: Payer: Federal, State, Local not specified - PPO | Admitting: Family

## 2020-12-16 ENCOUNTER — Encounter: Payer: Self-pay | Admitting: Family

## 2020-12-16 ENCOUNTER — Other Ambulatory Visit: Payer: Self-pay

## 2020-12-16 VITALS — BP 136/82 | HR 86 | Temp 97.6°F | Ht 67.5 in | Wt 188.6 lb

## 2020-12-16 DIAGNOSIS — F331 Major depressive disorder, recurrent, moderate: Secondary | ICD-10-CM

## 2020-12-16 DIAGNOSIS — E785 Hyperlipidemia, unspecified: Secondary | ICD-10-CM

## 2020-12-16 DIAGNOSIS — Z0289 Encounter for other administrative examinations: Secondary | ICD-10-CM

## 2020-12-16 DIAGNOSIS — I10 Essential (primary) hypertension: Secondary | ICD-10-CM

## 2020-12-16 DIAGNOSIS — Z23 Encounter for immunization: Secondary | ICD-10-CM

## 2020-12-16 DIAGNOSIS — M797 Fibromyalgia: Secondary | ICD-10-CM

## 2020-12-16 DIAGNOSIS — F1120 Opioid dependence, uncomplicated: Secondary | ICD-10-CM

## 2020-12-16 DIAGNOSIS — F411 Generalized anxiety disorder: Secondary | ICD-10-CM | POA: Diagnosis not present

## 2020-12-16 DIAGNOSIS — Z1211 Encounter for screening for malignant neoplasm of colon: Secondary | ICD-10-CM

## 2020-12-16 MED ORDER — NAPROXEN 500 MG PO TABS: 500.0000 mg | ORAL_TABLET | Freq: Two times a day (BID) | ORAL | 3 refills | Status: DC

## 2020-12-16 MED ORDER — TRAMADOL HCL 50 MG PO TABS: 100.0000 mg | ORAL_TABLET | Freq: Four times a day (QID) | ORAL | 2 refills | Status: DC | PRN

## 2020-12-16 NOTE — Patient Instructions (Signed)
Health Maintenance, Female Adopting a healthy lifestyle and getting preventive care are important in promoting health and wellness. Ask your health care provider about: The right schedule for you to have regular tests and exams. Things you can do on your own to prevent diseases and keep yourself healthy. What should I know about diet, weight, and exercise? Eat a healthy diet  Eat a diet that includes plenty of vegetables, fruits, low-fat dairy products, and lean protein. Do not eat a lot of foods that are high in solid fats, added sugars, or sodium.  Maintain a healthy weight Body mass index (BMI) is used to identify weight problems. It estimates body fat based on height and weight. Your health care provider can help determineyour BMI and help you achieve or maintain a healthy weight. Get regular exercise Get regular exercise. This is one of the most important things you can do for your health. Most adults should: Exercise for at least 150 minutes each week. The exercise should increase your heart rate and make you sweat (moderate-intensity exercise). Do strengthening exercises at least twice a week. This is in addition to the moderate-intensity exercise. Spend less time sitting. Even light physical activity can be beneficial. Watch cholesterol and blood lipids Have your blood tested for lipids and cholesterol at 56 years of age, then havethis test every 5 years. Have your cholesterol levels checked more often if: Your lipid or cholesterol levels are high. You are older than 56 years of age. You are at high risk for heart disease. What should I know about cancer screening? Depending on your health history and family history, you may need to have cancer screening at various ages. This may include screening for: Breast cancer. Cervical cancer. Colorectal cancer. Skin cancer. Lung cancer. What should I know about heart disease, diabetes, and high blood pressure? Blood pressure and heart  disease High blood pressure causes heart disease and increases the risk of stroke. This is more likely to develop in people who have high blood pressure readings, are of African descent, or are overweight. Have your blood pressure checked: Every 3-5 years if you are 18-39 years of age. Every year if you are 40 years old or older. Diabetes Have regular diabetes screenings. This checks your fasting blood sugar level. Have the screening done: Once every three years after age 40 if you are at a normal weight and have a low risk for diabetes. More often and at a younger age if you are overweight or have a high risk for diabetes. What should I know about preventing infection? Hepatitis B If you have a higher risk for hepatitis B, you should be screened for this virus. Talk with your health care provider to find out if you are at risk forhepatitis B infection. Hepatitis C Testing is recommended for: Everyone born from 1945 through 1965. Anyone with known risk factors for hepatitis C. Sexually transmitted infections (STIs) Get screened for STIs, including gonorrhea and chlamydia, if: You are sexually active and are younger than 56 years of age. You are older than 56 years of age and your health care provider tells you that you are at risk for this type of infection. Your sexual activity has changed since you were last screened, and you are at increased risk for chlamydia or gonorrhea. Ask your health care provider if you are at risk. Ask your health care provider about whether you are at high risk for HIV. Your health care provider may recommend a prescription medicine to help   prevent HIV infection. If you choose to take medicine to prevent HIV, you should first get tested for HIV. You should then be tested every 3 months for as long as you are taking the medicine. Pregnancy If you are about to stop having your period (premenopausal) and you may become pregnant, seek counseling before you get  pregnant. Take 400 to 800 micrograms (mcg) of folic acid every day if you become pregnant. Ask for birth control (contraception) if you want to prevent pregnancy. Osteoporosis and menopause Osteoporosis is a disease in which the bones lose minerals and strength with aging. This can result in bone fractures. If you are 65 years old or older, or if you are at risk for osteoporosis and fractures, ask your health care provider if you should: Be screened for bone loss. Take a calcium or vitamin D supplement to lower your risk of fractures. Be given hormone replacement therapy (HRT) to treat symptoms of menopause. Follow these instructions at home: Lifestyle Do not use any products that contain nicotine or tobacco, such as cigarettes, e-cigarettes, and chewing tobacco. If you need help quitting, ask your health care provider. Do not use street drugs. Do not share needles. Ask your health care provider for help if you need support or information about quitting drugs. Alcohol use Do not drink alcohol if: Your health care provider tells you not to drink. You are pregnant, may be pregnant, or are planning to become pregnant. If you drink alcohol: Limit how much you use to 0-1 drink a day. Limit intake if you are breastfeeding. Be aware of how much alcohol is in your drink. In the [place] one drink equals one 12 oz bottle of beer (355 mL), one 5 oz glass of wine (148 mL), or one 1 oz glass of hard liquor (44 mL). General instructions Schedule regular health, dental, and eye exams. Stay current with your vaccines. Tell your health care provider if: You often feel depressed. You have ever been abused or do not feel safe at home. Summary Adopting a healthy lifestyle and getting preventive care are important in promoting health and wellness. Follow your health care provider's instructions about healthy diet, exercising, and getting tested or screened for diseases. Follow your health care provider's  instructions on monitoring your cholesterol and blood pressure. This information is not intended to replace advice given to you by your health care provider. Make sure you discuss any questions you have with your healthcare provider. Document Revised: 04/12/2018 Document Reviewed: 04/12/2018 Elsevier Patient Education  2022 [employer]  

## 2020-12-16 NOTE — Progress Notes (Signed)
Subjective:    Patient ID: Rita Barry, female    DOB: 1965-08-25, 55 y.o.   MRN: [id number]  Chief Complaint  Patient presents with   Medical Management of Chronic Issues   Knee Injury    Right knee wants naproxen   PT presents to the office today for chronic follow up and pain mediation. She has fibromyalgia that is stable. [place] and is [place] active and states she has generalized pain in the evenings of 6-7 out 10.  Hypertension This is a chronic problem. The current episode started more than 1 year ago. The problem has been resolved since onset. The problem is controlled. Associated symptoms include anxiety. Pertinent negatives include no malaise/fatigue, peripheral edema or shortness of breath. Risk factors for coronary artery disease include dyslipidemia. The current treatment provides moderate improvement.  Gastroesophageal Reflux She complains of belching and heartburn. This is a chronic problem. The current episode started more than 1 year ago. The problem occurs occasionally. The problem has been waxing and waning. She has tried a PPI for the symptoms. The treatment provided moderate relief.  Benign Prostatic Hypertrophy This is a chronic problem. The current episode started more than 1 year ago. The treatment provided mild relief.  Anxiety Presents for follow-up visit. Symptoms include excessive worry, irritability, nervous/anxious behavior and restlessness. Patient reports no shortness of breath. Symptoms occur most days. The severity of symptoms is moderate.    Depression        This is a chronic problem.  The current episode started more than 1 year ago.   The onset quality is gradual.   The problem occurs intermittently.  Associated symptoms include restlessness.  Associated symptoms include no helplessness, no hopelessness and not sad.  Past medical history includes anxiety.   Knee Pain  The incident occurred more than 1 week ago. The pain is present in the  right knee. The pain is at a severity of 10/10. The pain is moderate. She has tried acetaminophen for the symptoms. The treatment provided mild relief.     Review of Systems  Constitutional:  Positive for irritability. Negative for malaise/fatigue.  Respiratory:  Negative for shortness of breath.   Gastrointestinal:  Positive for heartburn.  Psychiatric/Behavioral:  Positive for depression. The patient is nervous/anxious.   All other systems reviewed and are negative.     Objective:   Physical Exam Vitals reviewed.  Constitutional:      General: She is not in acute distress.    Appearance: She is well-developed.  HENT:     Head: Normocephalic and atraumatic.     Right Ear: Tympanic membrane normal.     Left Ear: Tympanic membrane normal.  Eyes:     Pupils: Pupils are equal, round, and reactive to light.  Neck:     Thyroid: No thyromegaly.  Cardiovascular:     Rate and Rhythm: Normal rate and regular rhythm.     Heart sounds: Normal heart sounds. No murmur heard. Pulmonary:     Effort: Pulmonary effort is normal. No respiratory distress.     Breath sounds: Normal breath sounds. No wheezing.  Abdominal:     General: Bowel sounds are normal. There is no distension.     Palpations: Abdomen is soft.     Tenderness: There is no abdominal tenderness.  Musculoskeletal:        General: No tenderness. Normal range of motion.     Cervical back: Normal range of motion and neck supple.  Skin:    General: Skin is warm and dry.  Neurological:     Mental Status: She is alert and oriented to person, place, and time.     Cranial Nerves: No cranial nerve deficit.     Deep Tendon Reflexes: Reflexes are normal and symmetric.  Psychiatric:        Behavior: Behavior normal.        Thought Content: Thought content normal.        Judgment: Judgment normal.      BP 136/82   Pulse 86   Temp 97.6 F (36.4 C) (Temporal)   Ht 5' 7.5" (1.715 m)   Wt 188 lb 9.6 oz (85.5 kg)   LMP  04/16/2016 (Approximate)   BMI 29.10 kg/m      Assessment & Plan:  VERNETTA DIZDAREVIC comes in today with chief complaint of Medical Management of Chronic Issues and Knee Injury (Right knee wants naproxen)   Diagnosis and orders addressed:  1. Hyperlipidemia, unspecified hyperlipidemia type - CBC with Differential/Platelet - CMP14+EGFR - Lipid panel  2. GAD (generalized anxiety disorder) - traMADol (ULTRAM) 50 MG tablet; Take 2 tablets (100 mg total) by mouth every 6 (six) hours as needed for severe pain.  Dispense: 90 tablet; Refill: 2  3. Pain medication agreement signed - traMADol (ULTRAM) 50 MG tablet; Take 2 tablets (100 mg total) by mouth every 6 (six) hours as needed for severe pain.  Dispense: 90 tablet; Refill: 2  4. Uncomplicated opioid dependence (HCC) - traMADol (ULTRAM) 50 MG tablet; Take 2 tablets (100 mg total) by mouth every 6 (six) hours as needed for severe pain.  Dispense: 90 tablet; Refill: 2  5. Fibromyalgia - traMADol (ULTRAM) 50 MG tablet; Take 2 tablets (100 mg total) by mouth every 6 (six) hours as needed for severe pain.  Dispense: 90 tablet; Refill: 2 - naproxen (NAPROSYN) 500 MG tablet; Take 1 tablet (500 mg total) by mouth 2 (two) times daily with a meal.  Dispense: 60 tablet; Refill: 3  6. Essential hypertension  7. Moderate episode of recurrent major depressive disorder ([place])  8. Screening for colon cancer - Cologuard  9. Need for shingles vaccine - Varicella-zoster vaccine IM (Shingrix)   Labs pending Patient reviewed in [place] controlled database, no flags noted. Contract and drug screen are up to date.  Health Maintenance reviewed Diet and exercise encouraged  Follow up plan: 3 months    [name], FNP

## 2020-12-17 LAB — CMP14+EGFR
ALT: 37 IU/L — ABNORMAL HIGH (ref 0–32)
AST: 42 IU/L — ABNORMAL HIGH (ref 0–40)
Albumin/Globulin Ratio: 2 (ref 1.2–2.2)
Albumin: 4.5 g/dL (ref 3.8–4.9)
Alkaline Phosphatase: 32 IU/L — ABNORMAL LOW (ref 44–121)
BUN/Creatinine Ratio: 24 — ABNORMAL HIGH (ref 9–23)
BUN: 18 mg/dL (ref 6–24)
Bilirubin Total: 0.4 mg/dL (ref 0.0–1.2)
CO2: 22 mmol/L (ref 20–29)
Calcium: 9.7 mg/dL (ref 8.7–10.2)
Chloride: 99 mmol/L (ref 96–106)
Creatinine, Ser: 0.75 mg/dL (ref 0.57–1.00)
Globulin, Total: 2.2 g/dL (ref 1.5–4.5)
Glucose: 92 mg/dL (ref 65–99)
Potassium: 5.2 mmol/L (ref 3.5–5.2)
Sodium: 139 mmol/L (ref 134–144)
Total Protein: 6.7 g/dL (ref 6.0–8.5)
eGFR: 94 mL/min/{1.73_m2} (ref >59)

## 2020-12-17 LAB — CBC WITH DIFFERENTIAL/PLATELET
Basophils Absolute: 0.1 10*3/uL (ref 0.0–0.2)
Basos: 2 %
EOS (ABSOLUTE): 0.8 10*3/uL — ABNORMAL HIGH (ref 0.0–0.4)
Eos: 18 %
Hematocrit: 37.3 % (ref 34.0–46.6)
Hemoglobin: 12.8 g/dL (ref 11.1–15.9)
Immature Grans (Abs): 0 10*3/uL (ref 0.0–0.1)
Immature Granulocytes: 0 %
Lymphocytes Absolute: 1.2 10*3/uL (ref 0.7–3.1)
Lymphs: 27 %
MCH: 34.5 pg — ABNORMAL HIGH (ref 26.6–33.0)
MCHC: 34.3 g/dL (ref 31.5–35.7)
MCV: 101 fL — ABNORMAL HIGH (ref 79–97)
Monocytes Absolute: 0.3 10*3/uL (ref 0.1–0.9)
Monocytes: 6 %
Neutrophils Absolute: 2.1 10*3/uL (ref 1.4–7.0)
Neutrophils: 47 %
Platelets: 310 10*3/uL (ref 150–450)
RBC: 3.71 x10E6/uL — ABNORMAL LOW (ref 3.77–5.28)
RDW: 12.9 % (ref 11.7–15.4)
WBC: 4.5 10*3/uL (ref 3.4–10.8)

## 2020-12-17 LAB — LIPID PANEL
Chol/HDL Ratio: 5 ratio — ABNORMAL HIGH (ref 0.0–4.4)
Cholesterol, Total: 300 mg/dL — ABNORMAL HIGH (ref 100–199)
HDL: 60 mg/dL (ref >39)
LDL Chol Calc (NIH): 173 mg/dL — ABNORMAL HIGH (ref 0–99)
Triglycerides: 349 mg/dL — ABNORMAL HIGH (ref 0–149)
VLDL Cholesterol Cal: 67 mg/dL — ABNORMAL HIGH (ref 5–40)

## 2020-12-24 DIAGNOSIS — K080 Exfoliation of teeth due to systemic causes: Secondary | ICD-10-CM | POA: Diagnosis not present

## 2020-12-25 ENCOUNTER — Other Ambulatory Visit: Payer: Self-pay | Admitting: Family

## 2021-01-07 LAB — COLOGUARD: Cologuard: NEGATIVE

## 2021-02-26 ENCOUNTER — Ambulatory Visit (INDEPENDENT_AMBULATORY_CARE_PROVIDER_SITE_OTHER): Payer: Federal, State, Local not specified - PPO

## 2021-02-26 ENCOUNTER — Other Ambulatory Visit: Payer: Self-pay

## 2021-02-26 DIAGNOSIS — Z23 Encounter for immunization: Secondary | ICD-10-CM | POA: Diagnosis not present

## 2021-02-27 ENCOUNTER — Ambulatory Visit (INDEPENDENT_AMBULATORY_CARE_PROVIDER_SITE_OTHER): Payer: Federal, State, Local not specified - PPO

## 2021-02-27 ENCOUNTER — Ambulatory Visit: Payer: Federal, State, Local not specified - PPO | Admitting: Family Medicine

## 2021-02-27 ENCOUNTER — Encounter: Payer: Self-pay | Admitting: Family Medicine

## 2021-02-27 VITALS — BP 134/79 | HR 74 | Temp 97.5°F | Ht 67.5 in | Wt 193.1 lb

## 2021-02-27 DIAGNOSIS — M542 Cervicalgia: Secondary | ICD-10-CM | POA: Diagnosis not present

## 2021-02-27 DIAGNOSIS — S199XXA Unspecified injury of neck, initial encounter: Secondary | ICD-10-CM | POA: Diagnosis not present

## 2021-02-27 DIAGNOSIS — S0990XA Unspecified injury of head, initial encounter: Secondary | ICD-10-CM

## 2021-02-27 NOTE — Patient Instructions (Signed)
Concussion, Adult °A concussion is a brain injury from a hard, direct hit (trauma) to the head or body. This direct hit causes the brain to shake quickly back and forth inside the skull. This can damage brain cells and cause chemical changes in the brain. A concussion may also be known as a mild traumatic brain injury (TBI). °Concussions are usually not life-threatening, but the effects of a concussion can be serious. If you have a concussion, you should be very careful to avoid having a second concussion. °What are the causes? °This condition is caused by: °A direct hit to your head, such as: °Running into another player during a game. °Being hit in a fight. °Hitting your head on a hard surface. °Sudden movement of your body that causes your brain to move back and forth inside the skull, such as in a car crash. °What are the signs or symptoms? °The signs of a concussion can be hard to notice. Early on, they may be missed by you, family members, and health care providers. You may look fine on the outside but may act or feel differently. °Every head injury is different. Symptoms are usually temporary but may last for days, weeks, or even months. Some symptoms appear right away, but other symptoms may not show up for hours or days. If your symptoms last longer than normal, you may have post-concussion syndrome. °Physical symptoms °Headaches. °Dizziness and problems with coordination or balance. °Sensitivity to light or noise. °Nausea or vomiting. °Tiredness (fatigue). °Vision or hearing problems. °Changes in eating or sleeping patterns. °Seizure. °Mental and emotional symptoms °Irritability or mood changes. °Memory problems. °Trouble concentrating, organizing, or making decisions. °Slowness in thinking, acting or reacting, speaking, or reading. °Anxiety or depression. °How is this diagnosed? °This condition is diagnosed based on: °Your symptoms. °A description of your injury. °You may also have tests,  including: °Imaging tests, such as a CT scan or an MRI. °Neuropsychological tests. These measure your thinking, understanding, learning, and remembering abilities. °How is this treated? °Treatment for this condition includes: °Stopping sports or activity if you are injured. If you hit your head or show signs of concussion: °Do not return to sports or activities the same day. °Get checked by a health care provider before you return to your activities. °Physical and mental rest and careful observation, usually at home. Gradually return to your normal activities. °Medicines to help with symptoms such as headaches, nausea, or difficulty sleeping. °Avoid taking opioid pain medicine while recovering from a concussion. °Avoiding alcohol and drugs. These may slow your recovery and can put you at risk of further injury. °Referral to a concussion clinic or rehabilitation center. °Recovery from a concussion can take time. How fast you recover depends on many factors. Return to activities only when: °Your symptoms are completely gone. °Your health care provider says that it is safe. °Follow these instructions at home: °Activity °Limit activities that require a lot of thought or concentration, such as: °Doing homework or job-related work. °Watching TV. °Working on the computer or phone. °Playing memory games and puzzles. °Rest. Rest helps your brain heal. Make sure you: °Get plenty of sleep. Most adults should get 7-9 hours of sleep each night. °Rest during the day. Take naps or rest breaks when you feel tired. °Avoid physical activity like exercise until your health care provider says it is safe. Stop any activity that worsens symptoms. °Do not do high-risk activities that could cause a second concussion, such as riding a bike or playing sports. °  Ask your health care provider when you can return to your normal activities, such as school, work, athletics, and driving. Your ability to react may be slower after a brain injury.  Never do these activities if you are dizzy. Your health care provider will likely give you a plan for gradually returning to activities. °General instructions ° °Take over-the-counter and prescription medicines only as told by your health care provider. Some medicines, such as blood thinners (anticoagulants) and aspirin, may increase the risk for complications, such as bleeding. °Do not drink alcohol until your health care provider says you can. °Watch your symptoms and tell others around you to do the same. Complications sometimes occur after a concussion. Older adults with a brain injury may have a higher risk of serious complications. °Tell your work manager, teachers, school nurse, school counselor, coach, or athletic trainer about your injury, symptoms, and restrictions. °Keep all follow-up visits as told by your health care provider. This is important. °How is this prevented? °Avoiding another brain injury is very important. In rare cases, another injury can lead to permanent brain damage, brain swelling, or death. The risk of this is greatest during the first 7-10 days after a head injury. Avoid injuries by: °Stopping activities that could lead to a second concussion, such as contact or recreational sports, until your health care provider says it is okay. °Taking these actions once you have returned to sports or activities: °Avoiding plays or moves that can cause you to crash into another person. This is how most concussions occur. °Following the rules and being respectful of other players. Do not engage in violent or illegal plays. °Getting regular exercise that includes strength and balance training. °Wearing a properly fitting helmet during sports, biking, or other activities. Helmets can help protect you from serious skull and brain injuries, but they may not protect you from a concussion. Even when wearing a helmet, you should avoid being hit in the head. °Contact a health care provider if: °Your  symptoms do not improve. °You have new symptoms. °You have another injury. °Get help right away if: °You have new or worsening physical symptoms, such as: °A severe or worsening headache. °Weakness or numbness in any part of your body, slurred speech, vision changes, or confusion. °Your coordination gets worse. °Vomiting repeatedly. °You have a seizure. °You have unusual behavior changes. °You lose consciousness, are sleepier than normal, or are difficult to wake up. °These symptoms may represent a serious problem that is an emergency. Do not wait to see if the symptoms will go away. Get medical help right away. Call your local emergency services (911 in the [place] Do not drive yourself to the hospital. °Summary °A concussion is a brain injury that results from a hard, direct hit (trauma) to your head or body. °You may have imaging tests and neuropsychological tests to diagnose a concussion. °Treatment for this condition includes physical and mental rest and careful observation. °Ask your health care provider when you can return to your normal activities, such as school, work, athletics, and driving. °Get help right away if you have a severe headache, weakness in any part of the body, seizures, behavior changes, changes in vision, or if you are confused or sleepier than normal. °This information is not intended to replace advice given to you by your health care provider. Make sure you discuss any questions you have with your health care provider. °Document Revised: 07/03/2020 Document Reviewed: 07/03/2020 °Elsevier Patient Education © 2022 [employer] ° °

## 2021-02-27 NOTE — Progress Notes (Signed)
Acute Office Visit  Subjective:    Patient ID: Rita Barry, female    DOB: 06/29/1965, 56 y.o.   MRN: [id number]  Chief Complaint  Patient presents with  . Neck Pain    HPI Patient is in today for a head injury that occurred 3 days ago. She was reaching down to swat a bee away from her horse when her horse swung his head and hit the top of her head. She denies LOC. She did have a terrible headache and neck pain after the injury. Since then she has had intermittent mild headaches. She also has felt foggy and a little off balance. She has had neck pain since then. The pain is down the middle of her neck. The pain is intermittent. The pain is achy. She denies numbness or tingling. She does feel like she has decreased ROM in her neck. She denies focal weakness. Denies nausea or vomiting. Denies changes in gait, visual disturbances, or confusion. She has taken naproxen for her symptoms.   Past Medical History:  Diagnosis Date  . Anxiety   . Depression   . DVT (deep venous thrombosis) (HCC)    Right calf  . 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 Fibromyalgia    Questionable    Past Surgical History:  Procedure Laterality Date  . COLONOSCOPY N/A 09/01/2015   Procedure: COLONOSCOPY;  Surgeon: Danie Binder, MD;  Location: [facility];  Service: Endoscopy;  Laterality: N/A;  9:45 AM  . ESOPHAGOGASTRODUODENOSCOPY N/A 06/04/2016   Procedure: ESOPHAGOGASTRODUODENOSCOPY (EGD);  Surgeon: Danie Binder, MD;  Location: [facility];  Service: Endoscopy;  Laterality: N/A;  10:30 am  . FINGER SURGERY    . SAVORY DILATION N/A 06/04/2016   Procedure: SAVORY DILATION;  Surgeon: Danie Binder, MD;  Location: [facility];  Service: Endoscopy;  Laterality: N/A;  . SHOULDER SURGERY     Left  . SHOULDER SURGERY Left    x2 surgeries    Family History  Problem Relation Age of Onset  . Cancer Father        Throat  . Hypertension Mother   . Breast  cancer Mother   . Diabetes Maternal Grandmother     Social History   Socioeconomic History  . Marital status: Married    Spouse name: Not on file  . Number of children: Not on file  . Years of education: Not on file  . Highest education level: Not on file  Occupational History  . Occupation: Works with Horses  Tobacco Use  . Smoking status: Never  . Smokeless tobacco: Never  Vaping Use  . Vaping Use: Never used  Substance and Sexual Activity  . Alcohol use: No    Comment: quit drinking 3 weeks ago  . Drug use: No  . Sexual activity: Yes  Other Topics Concern  . Not on file  Social History Narrative  . Not on file   Social Determinants of Health   Financial Resource Strain: Not on file  Food Insecurity: Not on file  Transportation Needs: Not on file  Physical Activity: Not on file  Stress: Not on file  Social Connections: Not on file  Intimate Partner Violence: Not on file    Outpatient Medications Prior to Visit  Medication Sig Dispense Refill  . albuterol (VENTOLIN HFA) 108 (90 Base) MCG/ACT inhaler TAKE 2 PUFFS BY MOUTH EVERY 6 HOURS AS NEEDED FOR WHEEZE  OR SHORTNESS OF BREATH 18 each 2  . escitalopram (LEXAPRO) 20 MG tablet TAKE 1 TABLET BY MOUTH EVERY DAY 90 tablet 0  . fluticasone (FLONASE) 50 MCG/ACT nasal spray PLACE 2 SPRAYS INTO THE NOSE DAILY. 48 mL 1  . GARLIC PO Take 1 tablet by mouth daily.    . hydrochlorothiazide (MICROZIDE) 12.5 MG capsule TAKE 1 CAPSULE BY MOUTH EVERY DAY 90 capsule 1  . Magnesium 250 MG TABS Take 250 mg by mouth at bedtime.     . Multiple Vitamins-Minerals (WOMENS MULTI VITAMIN & MINERAL PO) Take 1 tablet by mouth daily.    . naproxen (NAPROSYN) 500 MG tablet Take 1 tablet (500 mg total) by mouth 2 (two) times daily with a meal. 60 tablet 3  . traMADol (ULTRAM) 50 MG tablet Take 2 tablets (100 mg total) by mouth every 6 (six) hours as needed for severe pain. 90 tablet 2  . Vitamin D, Cholecalciferol, 1000 units CAPS Take 1,000  Units by mouth daily.     . rosuvastatin (CRESTOR) 5 MG tablet Take 1 tablet (5 mg total) by mouth daily. (Patient not taking: Reported on 02/27/2021) 90 tablet 3  . ondansetron (ZOFRAN) 4 MG tablet Take 1 tablet (4 mg total) by mouth every 8 (eight) hours as needed for nausea or vomiting. 20 tablet 0  . Probiotic Product (PHILLIPS COLON HEALTH PO) Take 1 tablet by mouth daily.    . Vitamin D, Ergocalciferol, (DRISDOL) 1.25 MG (50000 UNIT) CAPS capsule Take 1 capsule (50,000 Units total) by mouth every 7 (seven) days. 12 capsule 3   No facility-administered medications prior to visit.    Allergies  Allergen Reactions  . Penicillins Swelling    As a child. Has patient had a PCN reaction causing immediate rash, facial/tongue/throat swelling, SOB or lightheadedness with hypotension: Yes Has patient had a PCN reaction causing severe rash involving mucus membranes or skin necrosis: No Has patient had a PCN reaction that required hospitalization: No Has patient had a PCN reaction occurring within the last 10 years: No If all of the above answers are "NO", then may proceed with Cephalos    Review of Systems As per HPI.    Objective:    Physical Exam Vitals and nursing note reviewed.  Constitutional:      General: She is not in acute distress.    Appearance: Normal appearance. She is not ill-appearing, toxic-appearing or diaphoretic.  HENT:     Head: Normocephalic and atraumatic.  Eyes:     Extraocular Movements: Extraocular movements intact.     Conjunctiva/sclera: Conjunctivae normal.     Pupils: Pupils are equal, round, and reactive to light.  Cardiovascular:     Rate and Rhythm: Normal rate and regular rhythm.     Heart sounds: Normal heart sounds. No murmur heard. Pulmonary:     Effort: Pulmonary effort is normal. No respiratory distress.     Breath sounds: Normal breath sounds.  Musculoskeletal:     Cervical back: Normal range of motion. No edema, erythema or rigidity. Pain  with movement present. No spinous process tenderness or muscular tenderness. Normal range of motion.     Right lower leg: No edema.     Left lower leg: No edema.  Skin:    General: Skin is dry.  Neurological:     General: No focal deficit present.     Mental Status: She is alert and oriented to person, place, and time.     Cranial Nerves: No cranial nerve  deficit.     Sensory: No sensory deficit.     Motor: No weakness.     Coordination: Coordination normal.     Gait: Gait normal.  Psychiatric:        Mood and Affect: Mood normal.        Behavior: Behavior normal.        Thought Content: Thought content normal.        Judgment: Judgment normal.    BP 134/79   Pulse 74   Temp (!) 97.5 F (36.4 C) (Temporal)   Ht 5' 7.5" (1.715 m)   Wt 193 lb 2 oz (87.6 kg)   LMP 04/16/2016 (Approximate)   BMI 29.80 kg/m  Wt Readings from Last 3 Encounters:  02/27/21 193 lb 2 oz (87.6 kg)  12/16/20 188 lb 9.6 oz (85.5 kg)  06/02/20 189 lb 9.6 oz (86 kg)    Health Maintenance Due  Topic Date Due  . COVID-19 Vaccine (4 - Booster for Moderna series) 05/13/2020  . Zoster Vaccines- Shingrix (2 of 2) 02/10/2021    There are no preventive care reminders to display for this patient.   Lab Results  Component Value Date   TSH 1.970 06/02/2020   Lab Results  Component Value Date   WBC 4.5 12/16/2020   HGB 12.8 12/16/2020   HCT 37.3 12/16/2020   MCV 101 (H) 12/16/2020   PLT 310 12/16/2020   Lab Results  Component Value Date   NA 139 12/16/2020   K 5.2 12/16/2020   CO2 22 12/16/2020   GLUCOSE 92 12/16/2020   BUN 18 12/16/2020   CREATININE 0.75 12/16/2020   BILITOT 0.4 12/16/2020   ALKPHOS 32 (L) 12/16/2020   AST 42 (H) 12/16/2020   ALT 37 (H) 12/16/2020   PROT 6.7 12/16/2020   ALBUMIN 4.5 12/16/2020   CALCIUM 9.7 12/16/2020   ANIONGAP 10 07/10/2018   EGFR 94 12/16/2020   Lab Results  Component Value Date   CHOL 300 (H) 12/16/2020   Lab Results  Component Value Date    HDL 60 12/16/2020   Lab Results  Component Value Date   LDLCALC 173 (H) 12/16/2020   Lab Results  Component Value Date   TRIG 349 (H) 12/16/2020   Lab Results  Component Value Date   CHOLHDL 5.0 (H) 12/16/2020   No results found for: HGBA1C     Assessment & Plan:   Danisha was seen today for neck pain.  Diagnoses and all orders for this visit:  Acute neck pain Xray negative for acute findings today. Bone spurring and DDD noted. Discussed care for muscular strain. Naproxen, heat, ice, rest, stretching. -     DG Cervical Spine 2 or 3 views; Future  Injury of head, initial encounter 3 days ago. Normal neuro exam today. Discussed likely mild concussion. Discussed physical and mental rest needed. Naproxen prn for headache. Strict return precautions given.   Return to office for new or worsening symptoms, or if symptoms persist.   The patient indicates understanding of these issues and agrees with the plan.  Gwenlyn Perking, FNP

## 2021-03-16 ENCOUNTER — Encounter (HOSPITAL_COMMUNITY): Payer: Self-pay | Admitting: *Deleted

## 2021-03-16 ENCOUNTER — Other Ambulatory Visit: Payer: Self-pay

## 2021-03-16 ENCOUNTER — Emergency Department (HOSPITAL_COMMUNITY)
Admission: EM | Admit: 2021-03-16 | Discharge: 2021-03-16 | Disposition: A | Discharge status: Home / Self Care | Payer: Federal, State, Local not specified - PPO | Attending: Emergency Medicine | Admitting: Emergency Medicine

## 2021-03-16 DIAGNOSIS — I10 Essential (primary) hypertension: Secondary | ICD-10-CM | POA: Diagnosis not present

## 2021-03-16 DIAGNOSIS — Z79899 Other long term (current) drug therapy: Secondary | ICD-10-CM | POA: Diagnosis not present

## 2021-03-16 DIAGNOSIS — K625 Hemorrhage of anus and rectum: Secondary | ICD-10-CM | POA: Diagnosis not present

## 2021-03-16 DIAGNOSIS — K529 Noninfective gastroenteritis and colitis, unspecified: Secondary | ICD-10-CM | POA: Diagnosis not present

## 2021-03-16 LAB — COMPREHENSIVE METABOLIC PANEL
ALT: 35 U/L (ref 0–44)
AST: 29 U/L (ref 15–41)
Albumin: 3.8 g/dL (ref 3.5–5.0)
Alkaline Phosphatase: 28 U/L — ABNORMAL LOW (ref 38–126)
Anion gap: 9 (ref 5–15)
BUN: 9 mg/dL (ref 6–20)
CO2: 26 mmol/L (ref 22–32)
Calcium: 8.8 mg/dL — ABNORMAL LOW (ref 8.9–10.3)
Chloride: 101 mmol/L (ref 98–111)
Creatinine, Ser: 0.6 mg/dL (ref 0.44–1.00)
GFR, Estimated: >60 mL/min (ref >60)
Glucose, Bld: 102 mg/dL — ABNORMAL HIGH (ref 70–99)
Potassium: 3.7 mmol/L (ref 3.5–5.1)
Sodium: 136 mmol/L (ref 135–145)
Total Bilirubin: 1.2 mg/dL (ref 0.3–1.2)
Total Protein: 7.2 g/dL (ref 6.5–8.1)

## 2021-03-16 LAB — CBC WITH DIFFERENTIAL/PLATELET
Abs Immature Granulocytes: 0.06 10*3/uL (ref 0.00–0.07)
Basophils Absolute: 0.1 10*3/uL (ref 0.0–0.1)
Basophils Relative: 1 %
Eosinophils Absolute: 1 10*3/uL — ABNORMAL HIGH (ref 0.0–0.5)
Eosinophils Relative: 9 %
HCT: 36.6 % (ref 36.0–46.0)
Hemoglobin: 12.1 g/dL (ref 12.0–15.0)
Immature Granulocytes: 1 %
Lymphocytes Relative: 11 %
Lymphs Abs: 1.2 10*3/uL (ref 0.7–4.0)
MCH: 33.2 pg (ref 26.0–34.0)
MCHC: 33.1 g/dL (ref 30.0–36.0)
MCV: 100.3 fL — ABNORMAL HIGH (ref 80.0–100.0)
Monocytes Absolute: 0.6 10*3/uL (ref 0.1–1.0)
Monocytes Relative: 5 %
Neutro Abs: 8.8 10*3/uL — ABNORMAL HIGH (ref 1.7–7.7)
Neutrophils Relative %: 73 %
Platelets: 260 10*3/uL (ref 150–400)
RBC: 3.65 MIL/uL — ABNORMAL LOW (ref 3.87–5.11)
RDW: 13.3 % (ref 11.5–15.5)
WBC: 11.8 10*3/uL — ABNORMAL HIGH (ref 4.0–10.5)
nRBC: 0 % (ref 0.0–0.2)

## 2021-03-16 LAB — POC OCCULT BLOOD, ED: Fecal Occult Bld: POSITIVE — AB

## 2021-03-16 MED ORDER — METRONIDAZOLE 500 MG PO TABS: 500.0000 mg | ORAL_TABLET | Freq: Two times a day (BID) | ORAL | 0 refills | Status: DC

## 2021-03-16 MED ORDER — CIPROFLOXACIN HCL 500 MG PO TABS: 500.0000 mg | ORAL_TABLET | Freq: Two times a day (BID) | ORAL | 0 refills | Status: DC

## 2021-03-16 MED ORDER — SODIUM CHLORIDE 0.9 % IV BOLUS
1000.0000 mL | Freq: Once | INTRAVENOUS | Status: AC
  Administered 2021-03-16: 1000 mL via INTRAVENOUS

## 2021-03-16 MED ORDER — DICYCLOMINE HCL 20 MG PO TABS: 20.0000 mg | ORAL_TABLET | Freq: Two times a day (BID) | ORAL | 0 refills | Status: DC

## 2021-03-16 MED ORDER — MORPHINE SULFATE (PF) 4 MG/ML IV SOLN
6.0000 mg | Freq: Once | INTRAVENOUS | Status: AC
  Administered 2021-03-16: 6 mg via INTRAVENOUS
  Filled 2021-03-16: qty 2

## 2021-03-16 MED ORDER — DIPHENHYDRAMINE HCL 25 MG PO CAPS: 25.0000 mg | ORAL_CAPSULE | Freq: Three times a day (TID) | ORAL | 0 refills | Status: DC

## 2021-03-16 NOTE — Discharge Instructions (Signed)
We suspect that your symptoms are because of colitis.  Other possibility, as discussed could be allergic reaction from the seafood.  For now, I recommend that he take the antibiotics that are prescribed along with Benadryl every 6 hours. If you start having worsening bloody stools, please return to the ER.  Otherwise, follow-up with your primary care doctor sometime next week.

## 2021-03-16 NOTE — ED Triage Notes (Signed)
Pt c/o rectal bleeding and lower abdominal pain since Saturday. Pt reports she felt she may have had food poisoning from eating oysters on Friday. She started having n/v, weakness and dizziness on Saturday.

## 2021-03-16 NOTE — ED Provider Notes (Signed)
[facility] EMERGENCY DEPARTMENT Provider Note   CSN: [id number] Arrival date & time: 03/16/21  4270     History Chief Complaint  Patient presents with   Rectal Bleeding    Rita Barry is a 55 y.o. female.  HPI    55 year old female with history of hypertension comes in with chief complaint of rectal bleeding.  She reports that she had some oysters on Friday.  Saturday she started having cramping abdominal pain with diarrhea.  She had some blood in her stools at that time.  The diarrhea has resolved, but she continues to have pretty persistent cramping lower quadrant abdominal pain.  Today her BM had larger content of blood with clots, prompting her to come to the ER.  Patient is not on any blood thinners or antiplatelet agent.  No known history of diverticulosis.  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   Erosive gastritis    Esophageal dysphagia 02/21/2016   Essential hypertension 11/26/2015   Allergic rhinitis 08/25/2015   Pain medication agreement signed 08/01/2015   Opioid dependence (HCC) 08/01/2015   Vitamin D deficiency 08/23/2014   Hyperlipidemia 08/23/2014   GAD (generalized anxiety disorder) 09/18/2013   Benign paroxysmal positional vertigo 03/02/2013   General counseling for prescription of oral contraceptives 01/09/2013   Depression 08/08/2012   Fibromyalgia 08/08/2012    Past Surgical History:  Procedure Laterality Date   COLONOSCOPY N/A 09/01/2015   Procedure: COLONOSCOPY;  Surgeon: West Bali, MD;  Location: [facility];  Service: Endoscopy;  Laterality: N/A;  9:45 AM    ESOPHAGOGASTRODUODENOSCOPY N/A 06/04/2016   Procedure: ESOPHAGOGASTRODUODENOSCOPY (EGD);  Surgeon: West Bali, MD;  Location: [facility];  Service: Endoscopy;  Laterality: N/A;  10:30 am   FINGER SURGERY     SAVORY DILATION N/A 06/04/2016   Procedure: SAVORY DILATION;  Surgeon: West Bali, MD;  Location: [facility];  Service: Endoscopy;  Laterality: N/A;   SHOULDER SURGERY     Left   SHOULDER SURGERY Left    x2 surgeries     OB History   No obstetric history on file.     Family History  Problem Relation Age of Onset   Cancer Father        Throat   Hypertension Mother    Breast cancer Mother    Diabetes Maternal Grandmother     Social History   Tobacco Use   Smoking status: Never   Smokeless tobacco: Never  Vaping Use   Vaping Use: Never used  Substance Use Topics   Alcohol use: Yes    Comment: 2-3 glassses of wine weekly   Drug use: No    Home Medications Prior to Admission medications   Medication Sig Start Date End Date Taking? Authorizing Provider  albuterol (VENTOLIN HFA) 108 (90 Base) MCG/ACT inhaler TAKE 2 PUFFS BY MOUTH EVERY 6 HOURS AS NEEDED FOR WHEEZE OR SHORTNESS OF BREATH Patient taking differently: Inhale 1-2 puffs into the lungs every 6 (six) hours as needed for wheezing or shortness of breath. 06/04/20  Yes Hawks, Christy A, FNP  ciprofloxacin (CIPRO) 500 MG tablet Take 1 tablet (500 mg total) by mouth every 12 (twelve) hours. 03/16/21  Yes Varney Biles, MD  dicyclomine (BENTYL) 20 MG tablet Take 1 tablet (20 mg total) by mouth 2 (two) times daily. 03/16/21  Yes Varney Biles, MD  diphenhydrAMINE (BENADRYL) 25 mg capsule Take 1 capsule (25 mg total) by mouth in the morning, at noon, and at bedtime. 03/16/21  Yes Deshunda Thackston, MD  escitalopram (LEXAPRO) 20 MG tablet TAKE 1 TABLET BY MOUTH EVERY DAY Patient taking differently: Take 20 mg by mouth daily. 12/25/20  Yes Hawks, Christy A, FNP  fluticasone (FLONASE) 50 MCG/ACT nasal spray  PLACE 2 SPRAYS INTO THE NOSE DAILY. Patient taking differently: Place 2 sprays into both nostrils daily. 12/01/20  Yes Hawks, Christy A, FNP  hydrochlorothiazide (MICROZIDE) 12.5 MG capsule TAKE 1 CAPSULE BY MOUTH EVERY DAY Patient taking differently: Take 12.5 mg by mouth daily. 09/30/20  Yes Hawks, Theador Hawthorne, FNP  Multiple Vitamins-Minerals (WOMENS MULTI VITAMIN & MINERAL PO) Take 1 tablet by mouth daily.   Yes [provider]  naproxen (NAPROSYN) 500 MG tablet Take 1 tablet (500 mg total) by mouth 2 (two) times daily with a meal. Patient taking differently: Take 500 mg by mouth daily as needed for mild pain. 12/16/20  Yes Hawks, Christy A, FNP  traMADol (ULTRAM) 50 MG tablet Take 2 tablets (100 mg total) by mouth every 6 (six) hours as needed for severe pain. Patient taking differently: Take 50 mg by mouth daily at 6 (six) AM. 12/16/20  Yes Hawks, Christy A, FNP  Vitamin D, Cholecalciferol, 1000 units CAPS Take 1,000 Units by mouth daily.    Yes [provider]  metroNIDAZOLE (FLAGYL) 500 MG tablet Take 1 tablet (500 mg total) by mouth 2 (two) times daily. 03/16/21   Varney Biles, MD  rosuvastatin (CRESTOR) 5 MG tablet Take 1 tablet (5 mg total) by mouth daily. Patient not taking: No sig reported 06/03/20   [name] A, FNP    Allergies    Penicillins  Review of Systems   Review of Systems  Constitutional:  Positive for activity change.  Respiratory:  Negative for shortness of breath.   Cardiovascular:  Negative for chest pain.  Gastrointestinal:  Positive for abdominal pain, blood in stool and nausea. Negative for vomiting.  Allergic/Immunologic: Negative for immunocompromised state.  All other systems reviewed and are negative.  Physical Exam Updated Vital Signs BP (!) 141/91   Pulse 73   Temp 99.4 F (37.4 C) (Oral)   Resp 14   Ht 5' 7.5" (1.715 m)   Wt 86.2 kg   LMP 04/16/2016 (Approximate)   SpO2 100%   BMI 29.32 kg/m   Physical Exam Vitals and  nursing note reviewed.  Constitutional:      Appearance: She is well-developed.  HENT:     Head: Atraumatic.  Cardiovascular:     Rate and Rhythm: Normal rate.  Pulmonary:     Effort: Pulmonary effort is normal.  Abdominal:     Tenderness: There is abdominal tenderness. There is guarding. There is no rebound.     Comments: Generalized lower quadrant abdominal tenderness  Musculoskeletal:     Cervical back: Normal range of motion and neck supple.  Skin:    General: Skin is warm and dry.  Neurological:     Mental Status: She is alert and oriented to person, place, and time.    ED Results / Procedures /  Treatments   Labs (all labs ordered are listed, but only abnormal results are displayed) Labs Reviewed  COMPREHENSIVE METABOLIC PANEL - Abnormal; Notable for the following components:      Result Value   Glucose, Bld 102 (*)    Calcium 8.8 (*)    Alkaline Phosphatase 28 (*)    All other components within normal limits  CBC WITH DIFFERENTIAL/PLATELET - Abnormal; Notable for the following components:   WBC 11.8 (*)    RBC 3.65 (*)    MCV 100.3 (*)    Neutro Abs 8.8 (*)    Eosinophils Absolute 1.0 (*)    All other components within normal limits  POC OCCULT BLOOD, ED - Abnormal; Notable for the following components:   Fecal Occult Bld POSITIVE (*)    All other components within normal limits  GASTROINTESTINAL PANEL BY PCR, STOOL (REPLACES STOOL CULTURE)    EKG None  Radiology No results found.  Procedures Procedures   Medications Ordered in ED Medications  sodium chloride 0.9 % bolus 1,000 mL (0 mLs Intravenous Stopped 03/16/21 1122)  morphine 4 MG/ML injection 6 mg (6 mg Intravenous Given 03/16/21 1016)    ED Course  I have reviewed the triage vital signs and the nursing notes.  Pertinent labs & imaging results that were available during my care of the patient were reviewed by me and considered in my medical decision making (see chart for details).  Clinical  Course as of 03/16/21 1155  Mon Mar 16, 2021  1153 Results discussed with the patient.  She has mild elevation in white count.  Hemoglobin is stable.  She informed me that after she took seafood on Thursday, she did wake up with some hives on Friday which resolved with Benadryl.  Since then she has not had any hives.  Her family members do have allergy to seafood.  At this time, I think we probably still have colitis as the primary diagnosis although combination of abdominal cramping and hives does make me think about allergic reaction as well.  We have advised patient to take Benadryl for the next 3 to 5 days along with the antibiotics that are prescribed.  If her symptoms persist, then she will need to see your primary care doctor.  If her symptoms get worse then she will have to come to the ER. [AN]    Clinical Course User Index [AN] Varney Biles, MD   MDM Rules/Calculators/A&P                          55 year old female comes in with chief complaint of abdominal pain and bloody stools. She is noted to have lower quadrant abdominal tenderness without any peritoneal findings.  She also is reporting worsening bloody stool today although her diarrhea is resolving.  It appears that she had some seafood the night before.  That could be contributing and this could be a food toxin mediated process.  Additionally, bacterial colitis in the differential diagnosis along with diverticulosis.  Hemodynamically she stable.  We will get some basic labs, hydrate her and then have a discussion on next best step.  At this time she does not want anything for pain or nausea  Final Clinical Impression(s) / ED Diagnoses Final diagnoses:  Colitis    Rx / DC Orders ED Discharge Orders          Ordered    ciprofloxacin (CIPRO) 500 MG tablet  Every 12 hours  03/16/21 1151    metroNIDAZOLE (FLAGYL) 500 MG tablet  2 times daily,   Status:  Discontinued        03/16/21 1151    metroNIDAZOLE  (FLAGYL) 500 MG tablet  2 times daily        03/16/21 1151    dicyclomine (BENTYL) 20 MG tablet  2 times daily        03/16/21 1152    diphenhydrAMINE (BENADRYL) 25 mg capsule  3 times daily        03/16/21 1153             Varney Biles, MD 03/16/21 1155

## 2021-03-18 ENCOUNTER — Other Ambulatory Visit: Payer: Self-pay

## 2021-03-18 ENCOUNTER — Encounter: Payer: Self-pay | Admitting: Family

## 2021-03-18 ENCOUNTER — Ambulatory Visit (INDEPENDENT_AMBULATORY_CARE_PROVIDER_SITE_OTHER): Payer: Federal, State, Local not specified - PPO | Admitting: Family

## 2021-03-18 VITALS — BP 117/72 | HR 77 | Temp 97.8°F | Ht 67.5 in | Wt 190.0 lb

## 2021-03-18 DIAGNOSIS — E785 Hyperlipidemia, unspecified: Secondary | ICD-10-CM

## 2021-03-18 DIAGNOSIS — I10 Essential (primary) hypertension: Secondary | ICD-10-CM

## 2021-03-18 DIAGNOSIS — F411 Generalized anxiety disorder: Secondary | ICD-10-CM

## 2021-03-18 DIAGNOSIS — K529 Noninfective gastroenteritis and colitis, unspecified: Secondary | ICD-10-CM

## 2021-03-18 DIAGNOSIS — F1120 Opioid dependence, uncomplicated: Secondary | ICD-10-CM

## 2021-03-18 DIAGNOSIS — Z0289 Encounter for other administrative examinations: Secondary | ICD-10-CM

## 2021-03-18 DIAGNOSIS — M797 Fibromyalgia: Secondary | ICD-10-CM

## 2021-03-18 DIAGNOSIS — F331 Major depressive disorder, recurrent, moderate: Secondary | ICD-10-CM

## 2021-03-18 DIAGNOSIS — Z09 Encounter for follow-up examination after completed treatment for conditions other than malignant neoplasm: Secondary | ICD-10-CM

## 2021-03-18 MED ORDER — TRAMADOL HCL 50 MG PO TABS: 50.0000 mg | ORAL_TABLET | Freq: Every day | ORAL | 2 refills | Status: DC

## 2021-03-18 NOTE — Progress Notes (Signed)
Subjective:    Patient ID: Rita Barry, female    DOB: [birth year], 55 y.o.   MRN: [id number]  Chief Complaint  Patient presents with   Medical Management of Chronic Issues   PT presents to the office today for chronic follow up and pain mediation. She has fibromyalgia that is stable. [place] is [place] and states she has generalized pain in the evenings of 5 out 10.   She went to the ED on 03/16/21 with colitis and started on Cipro and Flagyl.  Hypertension This is a chronic problem. The current episode started more than 1 year ago. The problem has been resolved since onset. The problem is controlled. Associated symptoms include anxiety. Pertinent negatives include no malaise/fatigue, peripheral edema or shortness of breath. Risk factors for coronary artery disease include dyslipidemia. The current treatment provides moderate improvement.  Gastroesophageal Reflux She complains of belching and heartburn. This is a chronic problem. The current episode started more than 1 year ago. The problem occurs rarely. She has tried a diet change for the symptoms. The treatment provided moderate relief.  Anxiety Presents for follow-up visit. Symptoms include depressed mood, excessive worry, irritability and nervous/anxious behavior. Patient reports no restlessness or shortness of breath. Symptoms occur occasionally. The severity of symptoms is moderate.    Hyperlipidemia This is a chronic problem. The current episode started more than 1 year ago. She has no history of obesity. Pertinent negatives include no shortness of breath. Current antihyperlipidemic treatment includes statins. The current treatment provides moderate improvement of lipids. Risk factors for coronary artery disease include dyslipidemia, hypertension, a sedentary lifestyle and post-menopausal.  Depression        This is a chronic problem.  The current episode started more than 1 year ago.   The onset quality is  gradual.   The problem occurs intermittently.  Associated symptoms include no helplessness, no hopelessness, not irritable, no restlessness and not sad.  Past treatments include SSRIs - Selective serotonin reuptake inhibitors.  Past medical history includes anxiety.      Review of Systems  Constitutional:  Positive for irritability. Negative for malaise/fatigue.  Respiratory:  Negative for shortness of breath.   Gastrointestinal:  Positive for heartburn.  Psychiatric/Behavioral:  Positive for depression. The patient is nervous/anxious.   All other systems reviewed and are negative.     Objective:   Physical Exam Vitals reviewed.  Constitutional:      General: She is not irritable.She is not in acute distress.    Appearance: She is well-developed.  HENT:     Head: Normocephalic and atraumatic.     Right Ear: Tympanic membrane normal.     Left Ear: Tympanic membrane normal.  Eyes:     Pupils: Pupils are equal, round, and reactive to light.  Neck:     Thyroid: No thyromegaly.  Cardiovascular:     Rate and Rhythm: Normal rate and regular rhythm.     Heart sounds: Normal heart sounds. No murmur heard. Pulmonary:     Effort: Pulmonary effort is normal. No respiratory distress.     Breath sounds: Normal breath sounds. No wheezing.  Abdominal:     General: Bowel sounds are normal. There is no distension.     Palpations: Abdomen is soft.     Tenderness: There is abdominal tenderness.  Musculoskeletal:        General: No tenderness. Normal range of motion.     Cervical back: Normal range of motion and  neck supple.  Skin:    General: Skin is warm and dry.  Neurological:     Mental Status: She is alert and oriented to person, place, and time.     Cranial Nerves: No cranial nerve deficit.     Deep Tendon Reflexes: Reflexes are normal and symmetric.  Psychiatric:        Behavior: Behavior normal.        Thought Content: Thought content normal.        Judgment: Judgment normal.          BP 117/72   Pulse 77   Temp 97.8 F (36.6 C) (Temporal)   Ht 5' 7.5" (1.715 m)   Wt 190 lb (86.2 kg)   LMP 04/16/2016 (Approximate)   BMI 29.32 kg/m   Assessment & Plan:  Rita Barry comes in today with chief complaint of Medical Management of Chronic Issues   Diagnosis and orders addressed:  1. GAD (generalized anxiety disorder) - traMADol (ULTRAM) 50 MG tablet; Take 1 tablet (50 mg total) by mouth daily at 6 (six) AM.  Dispense: 90 tablet; Refill: 2  2. Pain medication agreement signed - traMADol (ULTRAM) 50 MG tablet; Take 1 tablet (50 mg total) by mouth daily at 6 (six) AM.  Dispense: 90 tablet; Refill: 2  3. Uncomplicated opioid dependence (HCC) - traMADol (ULTRAM) 50 MG tablet; Take 1 tablet (50 mg total) by mouth daily at 6 (six) AM.  Dispense: 90 tablet; Refill: 2  4. Fibromyalgia - traMADol (ULTRAM) 50 MG tablet; Take 1 tablet (50 mg total) by mouth daily at 6 (six) AM.  Dispense: 90 tablet; Refill: 2  5. Essential hypertension  6. Moderate episode of recurrent major depressive disorder ([place])  7. Hyperlipidemia, unspecified hyperlipidemia type  8. Hospital discharge follow-up  9. Colitis Continue medications   Labs pending Patient reviewed in [place] controlled database, no flags noted. Contract and drug screen are up to date.  Health Maintenance reviewed Diet and exercise encouraged  Follow up plan: 3 months    [name], FNP

## 2021-03-18 NOTE — Patient Instructions (Signed)
Low-FODMAP Eating Plan °FODMAP stands for fermentable oligosaccharides, disaccharides, monosaccharides, and polyols. These are sugars that are hard for some people to digest. A low-FODMAP eating plan may help some people who have irritable bowel syndrome (IBS) and certain other bowel (intestinal) diseases to manage their symptoms. °This meal plan can be complicated to follow. Work with a diet and nutrition specialist (dietitian) to make a low-FODMAP eating plan that is right for you. A dietitian can help make sure that you get enough nutrition from this diet. °What are tips for following this plan? °Reading food labels °Check labels for hidden FODMAPs such as: °High-fructose syrup. °Honey. °Agave. °Natural fruit flavors. °Onion or garlic powder. °Choose low-FODMAP foods that contain 3-4 grams of fiber per serving. °Check food labels for serving sizes. Eat only one serving at a time to make sure FODMAP levels stay low. °Shopping °Shop with a list of foods that are recommended on this diet and make a meal plan. °Meal planning °Follow a low-FODMAP eating plan for up to 6 weeks, or as told by your health care provider or dietitian. °To follow the eating plan: °Eliminate high-FODMAP foods from your diet completely. Choose only low-FODMAP foods to eat. You will do this for 2-6 weeks. °Gradually reintroduce high-FODMAP foods into your diet one at a time. Most people should wait a few days before introducing the next new high-FODMAP food into their meal plan. Your dietitian can recommend how quickly you may reintroduce foods. °Keep a daily record of what and how much you eat and drink. Make note of any symptoms that you have after eating. °Review your daily record with a dietitian regularly to identify which foods you can eat and which foods you should avoid. °General tips °Drink enough fluid each day to keep your urine pale yellow. °Avoid processed foods. These often have added sugar and may be high in FODMAPs. °Avoid most  dairy products, whole grains, and sweeteners. °Work with a dietitian to make sure you get enough fiber in your diet. °Avoid high FODMAP foods at meals to manage symptoms. °Recommended foods °Fruits °Bananas, oranges, tangerines, lemons, limes, blueberries, raspberries, strawberries, grapes, cantaloupe, honeydew melon, kiwi, papaya, passion fruit, and pineapple. Limited amounts of dried cranberries, banana chips, and shredded coconut. °Vegetables °Eggplant, zucchini, cucumber, peppers, green beans, bean sprouts, lettuce, arugula, kale, Swiss chard, spinach, collard greens, bok choy, summer squash, potato, and tomato. Limited amounts of corn, carrot, and sweet potato. Green parts of scallions. °Grains °Gluten-free grains, such as rice, oats, buckwheat, quinoa, corn, polenta, and millet. Gluten-free pasta, bread, or cereal. Rice noodles. Corn tortillas. °Meats and other proteins °Unseasoned beef, pork, poultry, or fish. Eggs. Bacon. Tofu (firm) and tempeh. Limited amounts of nuts and seeds, such as almonds, walnuts, brazil nuts, pecans, peanuts, nut butters, pumpkin seeds, chia seeds, and sunflower seeds. °Dairy °Lactose-free milk, yogurt, and kefir. Lactose-free cottage cheese and ice cream. Non-dairy milks, such as almond, coconut, hemp, and rice milk. Non-dairy yogurt. Limited amounts of goat cheese, brie, mozzarella, parmesan, swiss, and other hard cheeses. °Fats and oils °Butter-free spreads. Vegetable oils, such as olive, canola, and sunflower oil. °Seasoning and other foods °Artificial sweeteners with names that do not end in "ol," such as aspartame, saccharine, and stevia. Maple syrup, white table sugar, raw sugar, brown sugar, and molasses. Mayonnaise, soy sauce, and tamari. Fresh basil, coriander, parsley, rosemary, and thyme. °Beverages °Water and mineral water. Sugar-sweetened soft drinks. Small amounts of orange juice or cranberry juice. Black and green tea. Most dry wines. Coffee. °  The items listed above  may not be a complete list of foods and beverages you can eat. Contact a dietitian for more information. °Foods to avoid °Fruits °Fresh, dried, and juiced forms of apple, pear, watermelon, peach, plum, cherries, apricots, blackberries, boysenberries, figs, nectarines, and mango. Avocado. °Vegetables °Chicory root, artichoke, asparagus, cabbage, snow peas, Brussels sprouts, broccoli, sugar snap peas, mushrooms, celery, and cauliflower. Onions, garlic, leeks, and the white part of scallions. °Grains °Wheat, including kamut, durum, and semolina. Barley and bulgur. Couscous. Wheat-based cereals. Wheat noodles, bread, crackers, and pastries. °Meats and other proteins °Fried or fatty meat. Sausage. Cashews and pistachios. Soybeans, baked beans, black beans, chickpeas, kidney beans, fava beans, navy beans, lentils, black-eyed peas, and split peas. °Dairy °Milk, yogurt, ice cream, and soft cheese. Cream and sour cream. Milk-based sauces. Custard. Buttermilk. Soy milk. °Seasoning and other foods °Any sugar-free gum or candy. Foods that contain artificial sweeteners such as sorbitol, mannitol, isomalt, or xylitol. Foods that contain honey, high-fructose corn syrup, or agave. Bouillon, vegetable stock, beef stock, and chicken stock. Garlic and onion powder. Condiments made with onion, such as hummus, chutney, pickles, relish, salad dressing, and salsa. Tomato paste. °Beverages °Chicory-based drinks. Coffee substitutes. Chamomile tea. Fennel tea. Sweet or fortified wines such as port or sherry. Diet soft drinks made with isomalt, mannitol, maltitol, sorbitol, or xylitol. Apple, pear, and mango juice. Juices with high-fructose corn syrup. °The items listed above may not be a complete list of foods and beverages you should avoid. Contact a dietitian for more information. °Summary °FODMAP stands for fermentable oligosaccharides, disaccharides, monosaccharides, and polyols. These are sugars that are hard for some people to  digest. °A low-FODMAP eating plan is a short-term diet that helps to ease symptoms of certain bowel diseases. °The eating plan usually lasts up to 6 weeks. After that, high-FODMAP foods are reintroduced gradually and one at a time. This can help you find out which foods may be causing symptoms. °A low-FODMAP eating plan can be complicated. It is best to work with a dietitian who has experience with this type of plan. °This information is not intended to replace advice given to you by your health care provider. Make sure you discuss any questions you have with your health care provider. °Document Revised: 09/06/2019 Document Reviewed: 09/06/2019 °Elsevier Patient Education © 2022 [employer] Inc[employer] ° °

## 2021-03-20 ENCOUNTER — Telehealth: Payer: Self-pay | Admitting: Family

## 2021-03-20 DIAGNOSIS — Z91018 Allergy to other foods: Secondary | ICD-10-CM

## 2021-03-20 NOTE — Telephone Encounter (Signed)
REFERRAL REQUEST Telephone Note  Have you been seen at our office for this problem? No, but just seen [facility] & she forgot to mention it (Advise that they may need an appointment with their PCP before a referral can be done)  Reason for Referral: food allergy Referral discussed with patient: no  Best contact number of patient for referral team: [phone]    Has patient been seen by a specialist for this issue before: no  Patient provider preference for referral: no Patient location preference for referral: [place] or [place] area? [facility]   Patient notified that referrals can take up to a week or longer to process. If they haven't heard anything within a week they should call back and speak with the referral department.    Rita Barry' pt.  Please call pt.

## 2021-03-23 ENCOUNTER — Other Ambulatory Visit: Payer: Self-pay | Admitting: Family

## 2021-03-23 DIAGNOSIS — I10 Essential (primary) hypertension: Secondary | ICD-10-CM

## 2021-04-09 ENCOUNTER — Other Ambulatory Visit: Payer: Self-pay | Admitting: Family

## 2021-04-09 DIAGNOSIS — M797 Fibromyalgia: Secondary | ICD-10-CM

## 2021-05-07 DIAGNOSIS — M79672 Pain in left foot: Secondary | ICD-10-CM | POA: Diagnosis not present

## 2021-05-07 DIAGNOSIS — M722 Plantar fascial fibromatosis: Secondary | ICD-10-CM | POA: Diagnosis not present

## 2021-05-11 ENCOUNTER — Other Ambulatory Visit: Payer: Self-pay | Admitting: Family

## 2021-05-11 ENCOUNTER — Telehealth: Payer: Self-pay | Admitting: Family

## 2021-05-11 DIAGNOSIS — F1120 Opioid dependence, uncomplicated: Secondary | ICD-10-CM

## 2021-05-11 DIAGNOSIS — Z0289 Encounter for other administrative examinations: Secondary | ICD-10-CM

## 2021-05-11 DIAGNOSIS — F411 Generalized anxiety disorder: Secondary | ICD-10-CM

## 2021-05-11 DIAGNOSIS — M797 Fibromyalgia: Secondary | ICD-10-CM

## 2021-05-11 NOTE — Telephone Encounter (Signed)
Pt was trying to get a refill on her old rx. Advised pt to call [facility] and have the rx from 03/18/21 filled and pt voiced understanding.

## 2021-05-11 NOTE — Telephone Encounter (Signed)
Patient has appt with Alyse Low 2-16 and she is going to call the pharmacy to see what is going on with RX.

## 2021-05-11 NOTE — Telephone Encounter (Signed)
I sent in her Ultram on 03/18/21 with two refills. Is she having difficulty getting refills?

## 2021-05-11 NOTE — Telephone Encounter (Signed)
Controlled substance - [employer] for refills

## 2021-06-02 ENCOUNTER — Ambulatory Visit: Payer: Federal, State, Local not specified - PPO | Admitting: Nurse Practitioner

## 2021-06-02 ENCOUNTER — Encounter: Payer: Self-pay | Admitting: Nurse Practitioner

## 2021-06-02 VITALS — BP 132/82 | HR 79 | Temp 98.5°F | Ht 67.5 in | Wt 192.0 lb

## 2021-06-02 DIAGNOSIS — R052 Subacute cough: Secondary | ICD-10-CM | POA: Diagnosis not present

## 2021-06-02 MED ORDER — PSEUDOEPH-BROMPHEN-DM 30-2-10 MG/5ML PO SYRP: 5.0000 mL | ORAL_SOLUTION | Freq: Four times a day (QID) | ORAL | 0 refills | Status: DC | PRN

## 2021-06-02 MED ORDER — PREDNISONE 10 MG (21) PO TBPK: ORAL_TABLET | ORAL | 0 refills | Status: DC

## 2021-06-02 NOTE — Patient Instructions (Signed)

## 2021-06-02 NOTE — Progress Notes (Signed)
Acute Office Visit  Subjective:    Patient ID: Rita Barry, female    DOB: 02/07/1966, 56 y.o.   MRN: [id number]  Chief Complaint  Patient presents with   Cough    Non-productive cough Sneezing  Lungs feel irritated No fever    Cough This is a recurrent problem. The current episode started more than 1 month ago. The cough is Non-productive. Associated symptoms include ear congestion and nasal congestion. Pertinent negatives include no fever, headaches, postnasal drip, rash, sore throat or wheezing. Risk factors for lung disease include animal exposure. She has tried OTC cough suppressant (Albuterol) for the symptoms. The treatment provided mild relief. Her past medical history is significant for asthma.    Past Medical History:  Diagnosis Date   Anxiety    Depression    DVT (deep venous thrombosis) (HCC)    Right calf   Esophagitis, eosinophilic 1/75/1025   EGD FEB 2018 35 CM 22 HPF, 20 CM 23 HPF   Fibromyalgia    Questionable    Past Surgical History:  Procedure Laterality Date   COLONOSCOPY N/A 09/01/2015   Procedure: COLONOSCOPY;  Surgeon: Danie Binder, MD;  Location: [facility];  Service: Endoscopy;  Laterality: N/A;  9:45 AM   ESOPHAGOGASTRODUODENOSCOPY N/A 06/04/2016   Procedure: ESOPHAGOGASTRODUODENOSCOPY (EGD);  Surgeon: Danie Binder, MD;  Location: [facility];  Service: Endoscopy;  Laterality: N/A;  10:30 am   FINGER SURGERY     SAVORY DILATION N/A 06/04/2016   Procedure: SAVORY DILATION;  Surgeon: Danie Binder, MD;  Location: [facility];  Service: Endoscopy;  Laterality: N/A;   SHOULDER SURGERY     Left   SHOULDER SURGERY Left    x2 surgeries    Family History  Problem Relation Age of Onset   Cancer Father        Throat   Hypertension Mother    Breast cancer Mother    Diabetes Maternal Grandmother     Social History   Socioeconomic History   Marital status: Married    Spouse name: Not on file   Number of children: Not on file    Years of education: Not on file   Highest education level: Not on file  Occupational History   Occupation: Works with Horses  Tobacco Use   Smoking status: Never   Smokeless tobacco: Never  Vaping Use   Vaping Use: Never used  Substance and Sexual Activity   Alcohol use: Yes    Comment: 2-3 glassses of wine weekly   Drug use: No   Sexual activity: Yes  Other Topics Concern   Not on file  Social History Narrative   Not on file   Social Determinants of Health   Financial Resource Strain: Not on file  Food Insecurity: Not on file  Transportation Needs: Not on file  Physical Activity: Not on file  Stress: Not on file  Social Connections: Not on file  Intimate Partner Violence: Not on file    Outpatient Medications Prior to Visit  Medication Sig Dispense Refill   albuterol (VENTOLIN HFA) 108 (90 Base) MCG/ACT inhaler TAKE 2 PUFFS BY MOUTH EVERY 6 HOURS AS NEEDED FOR WHEEZE OR SHORTNESS OF BREATH (Patient taking differently: Inhale 1-2 puffs into the lungs every 6 (six) hours as needed for wheezing or shortness of breath.) 18 each 2   dicyclomine (BENTYL) 20 MG tablet Take 1 tablet (20 mg total) by mouth 2 (two) times daily. 20 tablet 0  diphenhydrAMINE (BENADRYL) 25 mg capsule Take 1 capsule (25 mg total) by mouth in the morning, at noon, and at bedtime. 10 capsule 0   escitalopram (LEXAPRO) 20 MG tablet TAKE 1 TABLET BY MOUTH EVERY DAY 90 tablet 1   fluticasone (FLONASE) 50 MCG/ACT nasal spray PLACE 2 SPRAYS INTO THE NOSE DAILY. (Patient taking differently: Place 2 sprays into both nostrils daily.) 48 mL 1   hydrochlorothiazide (MICROZIDE) 12.5 MG capsule TAKE 1 CAPSULE BY MOUTH EVERY DAY 90 capsule 1   metroNIDAZOLE (FLAGYL) 500 MG tablet Take 1 tablet (500 mg total) by mouth 2 (two) times daily. 10 tablet 0   Multiple Vitamins-Minerals (WOMENS MULTI VITAMIN & MINERAL PO) Take 1 tablet by mouth daily.     naproxen (NAPROSYN) 500 MG tablet TAKE 1 TABLET BY MOUTH 2 TIMES DAILY  WITH A MEAL. 60 tablet 2   rosuvastatin (CRESTOR) 5 MG tablet Take 1 tablet (5 mg total) by mouth daily. 90 tablet 3   traMADol (ULTRAM) 50 MG tablet Take 1 tablet (50 mg total) by mouth daily at 6 (six) AM. 90 tablet 2   Vitamin D, Cholecalciferol, 1000 units CAPS Take 1,000 Units by mouth daily.      ciprofloxacin (CIPRO) 500 MG tablet Take 1 tablet (500 mg total) by mouth every 12 (twelve) hours. 10 tablet 0   No facility-administered medications prior to visit.    Allergies  Allergen Reactions   Penicillins Swelling    As a child. Has patient had a PCN reaction causing immediate rash, facial/tongue/throat swelling, SOB or lightheadedness with hypotension: Yes Has patient had a PCN reaction causing severe rash involving mucus membranes or skin necrosis: No Has patient had a PCN reaction that required hospitalization: No Has patient had a PCN reaction occurring within the last 10 years: No If all of the above answers are "NO", then may proceed with Cephalos    Review of Systems  Constitutional:  Negative for fever.  HENT:  Negative for postnasal drip and sore throat.   Respiratory:  Positive for cough. Negative for wheezing.   Gastrointestinal: Negative.   Skin:  Negative for rash.  Neurological:  Negative for headaches.  All other systems reviewed and are negative.     Objective:    Physical Exam Vitals and nursing note reviewed.  Constitutional:      Appearance: She is normal weight.  HENT:     Right Ear: External ear normal.     Left Ear: External ear normal.     Nose: Congestion present.     Mouth/Throat:     Mouth: Mucous membranes are moist.     Pharynx: Oropharynx is clear.  Eyes:     Conjunctiva/sclera: Conjunctivae normal.  Cardiovascular:     Rate and Rhythm: Normal rate and regular rhythm.     Pulses: Normal pulses.     Heart sounds: Normal heart sounds.  Pulmonary:     Effort: Pulmonary effort is normal.     Breath sounds: Normal breath sounds.   Abdominal:     General: Bowel sounds are normal.  Skin:    General: Skin is warm.  Neurological:     Mental Status: She is alert and oriented to person, place, and time.  Psychiatric:        Behavior: Behavior normal.    BP 132/82    Pulse 79    Temp 98.5 F (36.9 C)    Ht 5' 7.5" (1.715 m)    Wt 192 lb (87.1 kg)  LMP 04/16/2016 (Approximate)    SpO2 96%    BMI 29.63 kg/m  Wt Readings from Last 3 Encounters:  06/02/21 192 lb (87.1 kg)  03/18/21 190 lb (86.2 kg)  03/16/21 190 lb (86.2 kg)      Lab Results  Component Value Date   TSH 1.970 06/02/2020   Lab Results  Component Value Date   WBC 11.8 (H) 03/16/2021   HGB 12.1 03/16/2021   HCT 36.6 03/16/2021   MCV 100.3 (H) 03/16/2021   PLT 260 03/16/2021   Lab Results  Component Value Date   NA 136 03/16/2021   K 3.7 03/16/2021   CO2 26 03/16/2021   GLUCOSE 102 (H) 03/16/2021   BUN 9 03/16/2021   CREATININE 0.60 03/16/2021   BILITOT 1.2 03/16/2021   ALKPHOS 28 (L) 03/16/2021   AST 29 03/16/2021   ALT 35 03/16/2021   PROT 7.2 03/16/2021   ALBUMIN 3.8 03/16/2021   CALCIUM 8.8 (L) 03/16/2021   ANIONGAP 9 03/16/2021   EGFR 94 12/16/2020   Lab Results  Component Value Date   CHOL 300 (H) 12/16/2020   Lab Results  Component Value Date   HDL 60 12/16/2020   Lab Results  Component Value Date   LDLCALC 173 (H) 12/16/2020   Lab Results  Component Value Date   TRIG 349 (H) 12/16/2020   Lab Results  Component Value Date   CHOLHDL 5.0 (H) 12/16/2020   No results found for: HGBA1C     Assessment & Plan:  Take meds as prescribed - Use a cool mist humidifier  -Use saline nose sprays frequently -Force fluids -For fever or aches or pains- take Tylenol or ibuprofen. -completed COVID-19 test results pending  -Bromfed for cough and cold symptom -Prednisone taper for non productive cough -continue Albuterol  Follow up with worsening unresolved symptoms  Problem List Items Addressed This Visit        Other   Subacute cough - Primary   Relevant Medications   brompheniramine-pseudoephedrine-DM 30-2-10 MG/5ML syrup   predniSONE (STERAPRED UNI-PAK 21 TAB) 10 MG (21) TBPK tablet   Other Relevant Orders   Novel Coronavirus, NAA ([facility])     Meds ordered this encounter  Medications   brompheniramine-pseudoephedrine-DM 30-2-10 MG/5ML syrup    Sig: Take 5 mLs by mouth 4 (four) times daily as needed.    Dispense:  120 mL    Refill:  0    Order Specific Question:   Supervising Provider    Answer:   Jeneen Rinks   predniSONE (STERAPRED UNI-PAK 21 TAB) 10 MG (21) TBPK tablet    Sig: 6 tablet day 1, 5 tablet day 2, 4  tablet day 3. 3 tablet day 4, 2 tablet day 5, 1 tablet day 6    Dispense:  1 each    Refill:  0    Order Specific Question:   Supervising Provider    AnswerJeneen Rinks     Ivy Lynn, NP

## 2021-06-03 LAB — SARS-COV-2, NAA 2 DAY TAT

## 2021-06-03 LAB — NOVEL CORONAVIRUS, NAA: SARS-CoV-2, NAA: NOT DETECTED

## 2021-06-18 ENCOUNTER — Ambulatory Visit: Payer: Federal, State, Local not specified - PPO | Admitting: Family

## 2021-06-18 ENCOUNTER — Encounter: Payer: Self-pay | Admitting: Family

## 2021-06-18 VITALS — BP 121/76 | HR 74 | Temp 97.3°F | Ht 67.5 in | Wt 193.0 lb

## 2021-06-18 DIAGNOSIS — F411 Generalized anxiety disorder: Secondary | ICD-10-CM | POA: Diagnosis not present

## 2021-06-18 DIAGNOSIS — E559 Vitamin D deficiency, unspecified: Secondary | ICD-10-CM | POA: Diagnosis not present

## 2021-06-18 DIAGNOSIS — I10 Essential (primary) hypertension: Secondary | ICD-10-CM | POA: Diagnosis not present

## 2021-06-18 DIAGNOSIS — S30860A Insect bite (nonvenomous) of lower back and pelvis, initial encounter: Secondary | ICD-10-CM

## 2021-06-18 DIAGNOSIS — E785 Hyperlipidemia, unspecified: Secondary | ICD-10-CM | POA: Diagnosis not present

## 2021-06-18 DIAGNOSIS — W57XXXA Bitten or stung by nonvenomous insect and other nonvenomous arthropods, initial encounter: Secondary | ICD-10-CM

## 2021-06-18 DIAGNOSIS — R5383 Other fatigue: Secondary | ICD-10-CM

## 2021-06-18 DIAGNOSIS — M797 Fibromyalgia: Secondary | ICD-10-CM

## 2021-06-18 DIAGNOSIS — F331 Major depressive disorder, recurrent, moderate: Secondary | ICD-10-CM

## 2021-06-18 DIAGNOSIS — Z23 Encounter for immunization: Secondary | ICD-10-CM | POA: Diagnosis not present

## 2021-06-18 NOTE — Patient Instructions (Signed)
Fatigue °If you have fatigue, you feel tired all the time and have a lack of energy or a lack of motivation. Fatigue may make it difficult to start or complete tasks because of exhaustion. In general, occasional or mild fatigue is often a normal response to activity or life. However, long-lasting (chronic) or extreme fatigue may be a symptom of a medical condition. °Follow these instructions at home: °General instructions °Watch your fatigue for any changes. °Go to bed and get up at the same time every day. °Avoid fatigue by pacing yourself during the day and getting enough sleep at night. °Maintain a healthy weight. °Medicines °Take over-the-counter and prescription medicines only as told by your health care provider. °Take a multivitamin, if told by your health care provider.  °Do not use herbal or dietary supplements unless they are approved by your health care provider. °Activity ° °Exercise regularly, as told by your health care provider. °Use or practice techniques to help you relax, such as yoga, tai chi, meditation, or massage therapy. °Eating and drinking ° °Avoid heavy meals in the evening. °Eat a well-balanced diet, which includes lean proteins, whole grains, plenty of fruits and vegetables, and low-fat dairy products. °Avoid consuming too much caffeine. °Avoid the use of alcohol. °Drink enough fluid to keep your urine pale yellow. °Lifestyle °Change situations that cause you stress. Try to keep your work and personal schedule in balance. °Do not use any products that contain nicotine or tobacco, such as cigarettes and e-cigarettes. If you need help quitting, ask your health care provider. °Do not use drugs. °Contact a health care provider if: °Your fatigue does not get better. °You have a fever. °You suddenly lose or gain weight. °You have headaches. °You have trouble falling asleep or sleeping through the night. °You feel angry, guilty, anxious, or sad. °You are unable to have a bowel movement  (constipation). °Your skin is dry. °You have swelling in your legs or another part of your body. °Get help right away if: °You feel confused. °Your vision is blurry. °You feel faint or you pass out. °You have a severe headache. °You have severe pain in your abdomen, your back, or the area between your waist and hips (pelvis). °You have chest pain, shortness of breath, or an irregular or fast heartbeat. °You are unable to urinate, or you urinate less than normal. °You have abnormal bleeding, such as bleeding from the rectum, vagina, nose, lungs, or nipples. °You vomit blood. °You have thoughts about hurting yourself or others. °If you ever feel like you may hurt yourself or others, or have thoughts about taking your own life, get help right away. You can go to your nearest emergency department or call: °Your local emergency services (911 in the [place] °A suicide crisis helpline, such as the National Suicide Prevention Lifeline at 1-800-273-8255 or 988 in the [place] This is open 24 hours a day. °Summary °If you have fatigue, you feel tired all the time and have a lack of energy or a lack of motivation. °Fatigue may make it difficult to start or complete tasks because of exhaustion. °Long-lasting (chronic) or extreme fatigue may be a symptom of a medical condition. °Exercise regularly, as told by your health care provider. °Change situations that cause you stress. Try to keep your work and personal schedule in balance. °This information is not intended to replace advice given to you by your health care provider. Make sure you discuss any questions you have with your health care provider. °Document Revised:   11/12/2020 Document Reviewed: 02/28/2020 °Elsevier Patient Education © 2022 [employer] ° °

## 2021-06-18 NOTE — Progress Notes (Signed)
Subjective:    Patient ID: Rita Barry, female    DOB: 1965-08-30, 56 y.o.   MRN: [id number]  Chief Complaint  Patient presents with   Medical Management of Chronic Issues   PT presents to the office today for chronic follow up. She has fibromyalgia that is stable. [place] and states she has generalized pain in the evenings of 4-5 out 10.   She reports she stopped her Ultram since our last visit.   She [place] and gets multiple ticks. Her last tick she removed on her back. She has chronic fatigue and wants to be checked for lyme.  Hypertension This is a chronic problem. The current episode started more than 1 year ago. The problem has been resolved since onset. The problem is controlled. Associated symptoms include anxiety. Pertinent negatives include no malaise/fatigue, peripheral edema or shortness of breath. Risk factors for coronary artery disease include dyslipidemia and obesity. The current treatment provides moderate improvement.  Anxiety Presents for follow-up visit. Symptoms include excessive worry, irritability and nervous/anxious behavior. Patient reports no depressed mood or shortness of breath. Symptoms occur occasionally. The severity of symptoms is moderate. The quality of sleep is good.    Hyperlipidemia This is a chronic problem. The current episode started more than 1 year ago. The problem is controlled. Pertinent negatives include no shortness of breath. Current antihyperlipidemic treatment includes diet change. The current treatment provides mild improvement of lipids. Risk factors for coronary artery disease include dyslipidemia.  Depression        This is a chronic problem.  The current episode started more than 1 year ago.   Associated symptoms include no helplessness, no hopelessness, not irritable and not sad.  Past medical history includes anxiety.      Review of Systems  Constitutional:  Positive for  irritability. Negative for malaise/fatigue.  Respiratory:  Negative for shortness of breath.   Psychiatric/Behavioral:  Positive for depression. The patient is nervous/anxious.   All other systems reviewed and are negative.     Objective:   Physical Exam Vitals reviewed.  Constitutional:      General: She is not irritable.She is not in acute distress.    Appearance: She is well-developed.  HENT:     Head: Normocephalic and atraumatic.     Right Ear: Tympanic membrane normal.     Left Ear: Tympanic membrane normal.  Eyes:     Pupils: Pupils are equal, round, and reactive to light.  Neck:     Thyroid: No thyromegaly.  Cardiovascular:     Rate and Rhythm: Normal rate and regular rhythm.     Heart sounds: Normal heart sounds. No murmur heard. Pulmonary:     Effort: Pulmonary effort is normal. No respiratory distress.     Breath sounds: Normal breath sounds. No wheezing.  Abdominal:     General: Bowel sounds are normal. There is no distension.     Palpations: Abdomen is soft.     Tenderness: There is no abdominal tenderness.  Musculoskeletal:        General: No tenderness. Normal range of motion.     Cervical back: Normal range of motion and neck supple.  Skin:    General: Skin is warm and dry.  Neurological:     Mental Status: She is alert and oriented to person, place, and time.     Cranial Nerves: No cranial nerve deficit.     Deep  Tendon Reflexes: Reflexes are normal and symmetric.  Psychiatric:        Behavior: Behavior normal.        Thought Content: Thought content normal.        Judgment: Judgment normal.         BP 121/76    Pulse 74    Temp (!) 97.3 F (36.3 C) (Temporal)    Ht 5' 7.5" (1.715 m)    Wt 193 lb (87.5 kg)    LMP 04/16/2016 (Approximate)    BMI 29.78 kg/m   Assessment & Plan:   [name] comes in today with chief complaint of Medical Management of Chronic Issues   Diagnosis and orders addressed:  1. Essential hypertension -  CMP14+EGFR - CBC with Differential/Platelet  2. Vitamin D deficiency - CMP14+EGFR - CBC with Differential/Platelet  3. Hyperlipidemia, unspecified hyperlipidemia type - CMP14+EGFR - CBC with Differential/Platelet  4. GAD (generalized anxiety disorder) - CMP14+EGFR - CBC with Differential/Platelet  5. Moderate episode of recurrent major depressive disorder (HCC) - CMP14+EGFR - CBC with Differential/Platelet  6. Fibromyalgia - CMP14+EGFR - CBC with Differential/Platelet  7. Tick bite of lower back, initial encounter - CMP14+EGFR - CBC with Differential/Platelet - Lyme Disease Serology w/Reflex - [place] mtn spotted fvr abs pnl(IgG+IgM)  8. Other fatigue - CMP14+EGFR - CBC with Differential/Platelet - Lyme Disease Serology w/Reflex - TSH   Labs pending Health Maintenance reviewed Diet and exercise encouraged  Follow up plan: 6 months    [name], FNP

## 2021-06-19 LAB — ROCKY MTN SPOTTED FVR ABS PNL(IGG+IGM)

## 2021-06-22 LAB — CBC WITH DIFFERENTIAL/PLATELET
Basophils Absolute: 0.1 10*3/uL (ref 0.0–0.2)
Basos: 1 %
EOS (ABSOLUTE): 0.8 10*3/uL — ABNORMAL HIGH (ref 0.0–0.4)
Eos: 14 %
Hematocrit: 40 % (ref 34.0–46.6)
Hemoglobin: 13.4 g/dL (ref 11.1–15.9)
Immature Grans (Abs): 0 10*3/uL (ref 0.0–0.1)
Immature Granulocytes: 0 %
Lymphocytes Absolute: 1.3 10*3/uL (ref 0.7–3.1)
Lymphs: 22 %
MCH: 32.8 pg (ref 26.6–33.0)
MCHC: 33.5 g/dL (ref 31.5–35.7)
MCV: 98 fL — ABNORMAL HIGH (ref 79–97)
Monocytes Absolute: 0.4 10*3/uL (ref 0.1–0.9)
Monocytes: 7 %
Neutrophils Absolute: 3.2 10*3/uL (ref 1.4–7.0)
Neutrophils: 56 %
Platelets: 294 10*3/uL (ref 150–450)
RBC: 4.08 x10E6/uL (ref 3.77–5.28)
RDW: 13.4 % (ref 11.7–15.4)
WBC: 5.8 10*3/uL (ref 3.4–10.8)

## 2021-06-22 LAB — CMP14+EGFR
ALT: 39 IU/L — ABNORMAL HIGH (ref 0–32)
AST: 40 IU/L (ref 0–40)
Albumin/Globulin Ratio: 1.8 (ref 1.2–2.2)
Albumin: 4.4 g/dL (ref 3.8–4.9)
Alkaline Phosphatase: 27 IU/L — ABNORMAL LOW (ref 44–121)
BUN/Creatinine Ratio: 13 (ref 9–23)
BUN: 10 mg/dL (ref 6–24)
Bilirubin Total: 0.7 mg/dL (ref 0.0–1.2)
CO2: 24 mmol/L (ref 20–29)
Calcium: 9.8 mg/dL (ref 8.7–10.2)
Chloride: 99 mmol/L (ref 96–106)
Creatinine, Ser: 0.8 mg/dL (ref 0.57–1.00)
Globulin, Total: 2.4 g/dL (ref 1.5–4.5)
Glucose: 106 mg/dL — ABNORMAL HIGH (ref 70–99)
Potassium: 4.9 mmol/L (ref 3.5–5.2)
Sodium: 137 mmol/L (ref 134–144)
Total Protein: 6.8 g/dL (ref 6.0–8.5)
eGFR: 86 mL/min/{1.73_m2} (ref >59)

## 2021-06-22 LAB — ROCKY MTN SPOTTED FVR ABS PNL(IGG+IGM)
RMSF IgG: UNDETERMINED
RMSF IgM: 0.66 index (ref 0.00–0.89)

## 2021-06-22 LAB — TSH: TSH: 1.54 u[IU]/mL (ref 0.450–4.500)

## 2021-06-22 LAB — LYME DISEASE SEROLOGY W/REFLEX: Lyme Total Antibody EIA: NEGATIVE

## 2021-06-22 LAB — RMSF, IGG, IFA: RMSF, IGG, IFA: 1:128 {titer} — ABNORMAL HIGH

## 2021-06-23 ENCOUNTER — Other Ambulatory Visit: Payer: Self-pay | Admitting: Family

## 2021-06-23 MED ORDER — DOXYCYCLINE HYCLATE 100 MG PO TABS: 100.0000 mg | ORAL_TABLET | Freq: Two times a day (BID) | ORAL | 0 refills | Status: AC

## 2021-06-28 ENCOUNTER — Other Ambulatory Visit: Payer: Self-pay | Admitting: Family

## 2021-06-28 DIAGNOSIS — M797 Fibromyalgia: Secondary | ICD-10-CM

## 2021-07-14 DIAGNOSIS — M79672 Pain in left foot: Secondary | ICD-10-CM | POA: Diagnosis not present

## 2021-07-14 DIAGNOSIS — M722 Plantar fascial fibromatosis: Secondary | ICD-10-CM | POA: Diagnosis not present

## 2021-08-03 DIAGNOSIS — Z1231 Encounter for screening mammogram for malignant neoplasm of breast: Secondary | ICD-10-CM | POA: Diagnosis not present

## 2021-08-04 DIAGNOSIS — M79672 Pain in left foot: Secondary | ICD-10-CM | POA: Diagnosis not present

## 2021-08-04 DIAGNOSIS — M722 Plantar fascial fibromatosis: Secondary | ICD-10-CM | POA: Diagnosis not present

## 2021-08-14 DIAGNOSIS — S298XXA Other specified injuries of thorax, initial encounter: Secondary | ICD-10-CM | POA: Diagnosis not present

## 2021-08-14 DIAGNOSIS — S4992XA Unspecified injury of left shoulder and upper arm, initial encounter: Secondary | ICD-10-CM | POA: Diagnosis not present

## 2021-08-14 DIAGNOSIS — S40022A Contusion of left upper arm, initial encounter: Secondary | ICD-10-CM | POA: Diagnosis not present

## 2021-08-14 DIAGNOSIS — S20212A Contusion of left front wall of thorax, initial encounter: Secondary | ICD-10-CM | POA: Diagnosis not present

## 2021-08-18 ENCOUNTER — Ambulatory Visit: Payer: Federal, State, Local not specified - PPO | Admitting: Family Medicine

## 2021-09-18 ENCOUNTER — Other Ambulatory Visit: Payer: Self-pay | Admitting: Family

## 2021-09-18 DIAGNOSIS — I10 Essential (primary) hypertension: Secondary | ICD-10-CM

## 2021-09-25 DIAGNOSIS — H16223 Keratoconjunctivitis sicca, not specified as Sjogren's, bilateral: Secondary | ICD-10-CM | POA: Diagnosis not present

## 2021-09-25 DIAGNOSIS — H52223 Regular astigmatism, bilateral: Secondary | ICD-10-CM | POA: Diagnosis not present

## 2021-09-25 DIAGNOSIS — H5212 Myopia, left eye: Secondary | ICD-10-CM | POA: Diagnosis not present

## 2021-09-25 DIAGNOSIS — H0288A Meibomian gland dysfunction right eye, upper and lower eyelids: Secondary | ICD-10-CM | POA: Diagnosis not present

## 2021-09-25 DIAGNOSIS — H0288B Meibomian gland dysfunction left eye, upper and lower eyelids: Secondary | ICD-10-CM | POA: Diagnosis not present

## 2021-09-25 DIAGNOSIS — H5201 Hypermetropia, right eye: Secondary | ICD-10-CM | POA: Diagnosis not present

## 2021-09-25 DIAGNOSIS — H524 Presbyopia: Secondary | ICD-10-CM | POA: Diagnosis not present

## 2021-09-25 DIAGNOSIS — H40053 Ocular hypertension, bilateral: Secondary | ICD-10-CM | POA: Diagnosis not present

## 2021-10-02 ENCOUNTER — Encounter: Payer: Self-pay | Admitting: Family Medicine

## 2021-10-02 ENCOUNTER — Ambulatory Visit (INDEPENDENT_AMBULATORY_CARE_PROVIDER_SITE_OTHER): Payer: Federal, State, Local not specified - PPO

## 2021-10-02 ENCOUNTER — Ambulatory Visit: Payer: Federal, State, Local not specified - PPO | Admitting: Family Medicine

## 2021-10-02 VITALS — BP 138/81 | HR 90 | Temp 97.3°F | Ht 67.5 in | Wt 193.0 lb

## 2021-10-02 DIAGNOSIS — M79672 Pain in left foot: Secondary | ICD-10-CM

## 2021-10-02 MED ORDER — DICLOFENAC SODIUM 75 MG PO TBEC: 75.0000 mg | DELAYED_RELEASE_TABLET | Freq: Two times a day (BID) | ORAL | 0 refills | Status: DC

## 2021-10-02 NOTE — Progress Notes (Signed)
   Acute Office Visit  Subjective:     Patient ID: Rita Barry, female    DOB: 06-06-1965, 56 y.o.   MRN: [id number]  Chief Complaint  Patient presents with   Foot Pain    Foot Pain  Patient is in today for left foot pain x 2 weeks. It is present on the top mid-lateral aspect of her foot. The pain is very sharp when she flexes her foot. It is also worse when walking. She denies recent injury. She does have a history of old fractures from horse related injuries. She has tried naprosyn and rest without improvement. Denies numbness or tingling.   ROS As per HPI.      Objective:    BP 138/81   Pulse 90   Temp (!) 97.3 F (36.3 C) (Temporal)   Ht 5' 7.5" (1.715 m)   Wt 193 lb (87.5 kg)   LMP 04/16/2016 (Approximate)   SpO2 95%   BMI 29.78 kg/m    Physical Exam Vitals and nursing note reviewed.  Constitutional:      General: She is not in acute distress.    Appearance: She is not ill-appearing, toxic-appearing or diaphoretic.  Cardiovascular:     Rate and Rhythm: Normal rate and regular rhythm.     Heart sounds: Normal heart sounds. No murmur heard. Pulmonary:     Effort: Pulmonary effort is normal. No respiratory distress.     Breath sounds: Normal breath sounds.  Musculoskeletal:     Comments: Tenderness with mild swelling up mid dorsal foot. No erythema or rash present. No bony tenderness. Full ROM. Sensation intact. Brisk cap refill  Skin:    General: Skin is warm and dry.  Neurological:     General: No focal deficit present.     Mental Status: She is alert and oriented to person, place, and time.     Gait: Gait abnormal (antalgic gait).  Psychiatric:        Mood and Affect: Mood normal.        Behavior: Behavior normal.    No results found for any visits on 10/02/21.      Assessment & Plan:   Aryam was seen today for foot pain.  Diagnoses and all orders for this visit:  Left foot pain Xray negative for acute findings today. Does show spurring  of mid dorsal foot and a small heal spur. Discussed results with patient. Try voltaren as below, do not take other NSAIDs with this. Discussed rest, ice, heat, and elevation. She does have a boot at home that she can wear. Return to office for new or worsening symptoms, or if symptoms persist.  -     DG Foot Complete Left; Future -     diclofenac (VOLTAREN) 75 MG EC tablet; Take 1 tablet (75 mg total) by mouth 2 (two) times daily.   Return if symptoms worsen or fail to improve.  The patient indicates understanding of these issues and agrees with the plan.   Gabriel Earing, FNP

## 2021-10-02 NOTE — Patient Instructions (Signed)

## 2021-10-04 ENCOUNTER — Other Ambulatory Visit: Payer: Self-pay | Admitting: Family

## 2021-10-04 DIAGNOSIS — M797 Fibromyalgia: Secondary | ICD-10-CM

## 2021-10-22 ENCOUNTER — Telehealth: Payer: Self-pay | Admitting: Family

## 2021-10-22 NOTE — Telephone Encounter (Signed)
Patient would like the results from her x ray on 6/2 added to a disc for her to be able to take or other doctors offices.Please call when it is ready to be picked up.

## 2021-10-23 NOTE — Telephone Encounter (Signed)
Disk up front - pt aware to pick up

## 2021-11-02 DIAGNOSIS — M19071 Primary osteoarthritis, right ankle and foot: Secondary | ICD-10-CM | POA: Diagnosis not present

## 2021-11-13 DIAGNOSIS — H16223 Keratoconjunctivitis sicca, not specified as Sjogren's, bilateral: Secondary | ICD-10-CM | POA: Diagnosis not present

## 2021-11-13 DIAGNOSIS — H0288B Meibomian gland dysfunction left eye, upper and lower eyelids: Secondary | ICD-10-CM | POA: Diagnosis not present

## 2021-11-13 DIAGNOSIS — H0288A Meibomian gland dysfunction right eye, upper and lower eyelids: Secondary | ICD-10-CM | POA: Diagnosis not present

## 2021-11-24 DIAGNOSIS — M19071 Primary osteoarthritis, right ankle and foot: Secondary | ICD-10-CM | POA: Diagnosis not present

## 2021-11-24 DIAGNOSIS — S92325D Nondisplaced fracture of second metatarsal bone, left foot, subsequent encounter for fracture with routine healing: Secondary | ICD-10-CM | POA: Diagnosis not present

## 2021-11-24 DIAGNOSIS — R262 Difficulty in walking, not elsewhere classified: Secondary | ICD-10-CM | POA: Diagnosis not present

## 2021-12-01 DIAGNOSIS — M5031 Other cervical disc degeneration,  high cervical region: Secondary | ICD-10-CM | POA: Diagnosis not present

## 2021-12-01 DIAGNOSIS — M9901 Segmental and somatic dysfunction of cervical region: Secondary | ICD-10-CM | POA: Diagnosis not present

## 2021-12-03 DIAGNOSIS — M5031 Other cervical disc degeneration,  high cervical region: Secondary | ICD-10-CM | POA: Diagnosis not present

## 2021-12-03 DIAGNOSIS — M9901 Segmental and somatic dysfunction of cervical region: Secondary | ICD-10-CM | POA: Diagnosis not present

## 2021-12-16 DIAGNOSIS — R262 Difficulty in walking, not elsewhere classified: Secondary | ICD-10-CM | POA: Diagnosis not present

## 2021-12-16 DIAGNOSIS — S92325D Nondisplaced fracture of second metatarsal bone, left foot, subsequent encounter for fracture with routine healing: Secondary | ICD-10-CM | POA: Diagnosis not present

## 2021-12-17 ENCOUNTER — Ambulatory Visit: Payer: Federal, State, Local not specified - PPO | Admitting: Family

## 2021-12-17 ENCOUNTER — Encounter: Payer: Self-pay | Admitting: Family

## 2021-12-17 VITALS — BP 134/85 | HR 79 | Temp 97.1°F | Ht 67.5 in | Wt 192.0 lb

## 2021-12-17 DIAGNOSIS — I10 Essential (primary) hypertension: Secondary | ICD-10-CM

## 2021-12-17 DIAGNOSIS — F411 Generalized anxiety disorder: Secondary | ICD-10-CM

## 2021-12-17 DIAGNOSIS — E559 Vitamin D deficiency, unspecified: Secondary | ICD-10-CM

## 2021-12-17 DIAGNOSIS — Z0001 Encounter for general adult medical examination with abnormal findings: Secondary | ICD-10-CM | POA: Diagnosis not present

## 2021-12-17 DIAGNOSIS — M797 Fibromyalgia: Secondary | ICD-10-CM

## 2021-12-17 DIAGNOSIS — D6862 Lupus anticoagulant syndrome: Secondary | ICD-10-CM | POA: Diagnosis not present

## 2021-12-17 DIAGNOSIS — Z0000 Encounter for general adult medical examination without abnormal findings: Secondary | ICD-10-CM

## 2021-12-17 DIAGNOSIS — E785 Hyperlipidemia, unspecified: Secondary | ICD-10-CM

## 2021-12-17 DIAGNOSIS — F331 Major depressive disorder, recurrent, moderate: Secondary | ICD-10-CM

## 2021-12-17 DIAGNOSIS — Z713 Dietary counseling and surveillance: Secondary | ICD-10-CM

## 2021-12-17 DIAGNOSIS — E663 Overweight: Secondary | ICD-10-CM

## 2021-12-17 MED ORDER — HYDROCHLOROTHIAZIDE 12.5 MG PO CAPS: ORAL_CAPSULE | ORAL | 1 refills | Status: DC

## 2021-12-17 MED ORDER — PHENTERMINE HCL 37.5 MG PO TABS: 37.5000 mg | ORAL_TABLET | Freq: Every day | ORAL | 2 refills | Status: DC

## 2021-12-17 MED ORDER — ESCITALOPRAM OXALATE 20 MG PO TABS: 20.0000 mg | ORAL_TABLET | Freq: Every day | ORAL | 1 refills | Status: DC

## 2021-12-17 NOTE — Patient Instructions (Addendum)
Calorie Counting for Weight Loss Calories are units of energy. Your body needs a certain number of calories from food to keep going throughout the day. When you eat or drink more calories than your body needs, your body stores the extra calories mostly as fat. When you eat or drink fewer calories than your body needs, your body burns fat to get the energy it needs. Calorie counting means keeping track of how many calories you eat and drink each day. Calorie counting can be helpful if you need to lose weight. If you eat fewer calories than your body needs, you should lose weight. Ask your health care provider what a healthy weight is for you. For calorie counting to work, you will need to eat the right number of calories each day to lose a healthy amount of weight per week. A dietitian can help you figure out how many calories you need in a day and will suggest ways to reach your calorie goal. A healthy amount of weight to lose each week is usually 1-2 lb (0.5-0.9 kg). This usually means that your daily calorie intake should be reduced by 500-750 calories. Eating 1,200-1,500 calories a day can help most women lose weight. Eating 1,500-1,800 calories a day can help most men lose weight. What do I need to know about calorie counting? Work with your health care provider or dietitian to determine how many calories you should get each day. To meet your daily calorie goal, you will need to: Find out how many calories are in each food that you would like to eat. Try to do this before you eat. Decide how much of the food you plan to eat. Keep a food log. Do this by writing down what you ate and how many calories it had. To successfully lose weight, it is important to balance calorie counting with a healthy lifestyle that includes regular activity. Where do I find calorie information?  The number of calories in a food can be found on a Nutrition Facts label. If a food does not have a Nutrition Facts label, try  to look up the calories online or ask your dietitian for help. Remember that calories are listed per serving. If you choose to have more than one serving of a food, you will have to multiply the calories per serving by the number of servings you plan to eat. For example, the label on a package of bread might say that a serving size is 1 slice and that there are 90 calories in a serving. If you eat 1 slice, you will have eaten 90 calories. If you eat 2 slices, you will have eaten 180 calories. How do I keep a food log? After each time that you eat, record the following in your food log as soon as possible: What you ate. Be sure to include toppings, sauces, and other extras on the food. How much you ate. This can be measured in cups, ounces, or number of items. How many calories were in each food and drink. The total number of calories in the food you ate. Keep your food log near you, such as in a pocket-sized notebook or on an app or website on your mobile phone. Some programs will calculate calories for you and show you how many calories you have left to meet your daily goal. What are some portion-control tips? Know how many calories are in a serving. This will help you know how many servings you can have of a certain   food. Use a measuring cup to measure serving sizes. You could also try weighing out portions on a kitchen scale. With time, you will be able to estimate serving sizes for some foods. Take time to put servings of different foods on your favorite plates or in your favorite bowls and cups so you know what a serving looks like. Try not to eat straight from a food's packaging, such as from a bag or box. Eating straight from the package makes it hard to see how much you are eating and can lead to overeating. Put the amount you would like to eat in a cup or on a plate to make sure you are eating the right portion. Use smaller plates, glasses, and bowls for smaller portions and to prevent  overeating. Try not to multitask. For example, avoid watching TV or using your computer while eating. If it is time to eat, sit down at a table and enjoy your food. This will help you recognize when you are full. It will also help you be more mindful of what and how much you are eating. What are tips for following this plan? Reading food labels Check the calorie count compared with the serving size. The serving size may be smaller than what you are used to eating. Check the source of the calories. Try to choose foods that are high in protein, fiber, and vitamins, and low in saturated fat, trans fat, and sodium. Shopping Read nutrition labels while you shop. This will help you make healthy decisions about which foods to buy. Pay attention to nutrition labels for low-fat or fat-free foods. These foods sometimes have the same number of calories or more calories than the full-fat versions. They also often have added sugar, starch, or salt to make up for flavor that was removed with the fat. Make a grocery list of lower-calorie foods and stick to it. Cooking Try to cook your favorite foods in a healthier way. For example, try baking instead of frying. Use low-fat dairy products. Meal planning Use more fruits and vegetables. One-half of your plate should be fruits and vegetables. Include lean proteins, such as chicken, turkey, and fish. Lifestyle Each week, aim to do one of the following: 150 minutes of moderate exercise, such as walking. 75 minutes of vigorous exercise, such as running. General information Know how many calories are in the foods you eat most often. This will help you calculate calorie counts faster. Find a way of tracking calories that works for you. Get creative. Try different apps or programs if writing down calories does not work for you. What foods should I eat?  Eat nutritious foods. It is better to have a nutritious, high-calorie food, such as an avocado, than a food with  few nutrients, such as a bag of potato chips. Use your calories on foods and drinks that will fill you up and will not leave you hungry soon after eating. Examples of foods that fill you up are nuts and nut butters, vegetables, lean proteins, and high-fiber foods such as whole grains. High-fiber foods are foods with more than 5 g of fiber per serving. Pay attention to calories in drinks. Low-calorie drinks include water and unsweetened drinks. The items listed above may not be a complete list of foods and beverages you can eat. Contact a dietitian for more information. What foods should I limit? Limit foods or drinks that are not good sources of vitamins, minerals, or protein or that are high in unhealthy fats. These   include: Candy. Other sweets. Sodas, specialty coffee drinks, alcohol, and juice. The items listed above may not be a complete list of foods and beverages you should avoid. Contact a dietitian for more information. How do I count calories when eating out? Pay attention to portions. Often, portions are much larger when eating out. Try these tips to keep portions smaller: Consider sharing a meal instead of getting your own. If you get your own meal, eat only half of it. Before you start eating, ask for a container and put half of your meal into it. When available, consider ordering smaller portions from the menu instead of full portions. Pay attention to your food and drink choices. Knowing the way food is cooked and what is included with the meal can help you eat fewer calories. If calories are listed on the menu, choose the lower-calorie options. Choose dishes that include vegetables, fruits, whole grains, low-fat dairy products, and lean proteins. Choose items that are boiled, broiled, grilled, or steamed. Avoid items that are buttered, battered, fried, or served with cream sauce. Items labeled as crispy are usually fried, unless stated otherwise. Choose water, low-fat milk,  unsweetened iced tea, or other drinks without added sugar. If you want an alcoholic beverage, choose a lower-calorie option, such as a glass of wine or light beer. Ask for dressings, sauces, and syrups on the side. These are usually high in calories, so you should limit the amount you eat. If you want a salad, choose a garden salad and ask for grilled meats. Avoid extra toppings such as bacon, cheese, or fried items. Ask for the dressing on the side, or ask for olive oil and vinegar or lemon to use as dressing. Estimate how many servings of a food you are given. Knowing serving sizes will help you be aware of how much food you are eating at restaurants. Where to find more information Centers for Disease Control and Prevention: www.cdc.gov [place] Department of Agriculture: myplate.gov Summary Calorie counting means keeping track of how many calories you eat and drink each day. If you eat fewer calories than your body needs, you should lose weight. A healthy amount of weight to lose per week is usually 1-2 lb (0.5-0.9 kg). This usually means reducing your daily calorie intake by 500-750 calories. The number of calories in a food can be found on a Nutrition Facts label. If a food does not have a Nutrition Facts label, try to look up the calories online or ask your dietitian for help. Use smaller plates, glasses, and bowls for smaller portions and to prevent overeating. Use your calories on foods and drinks that will fill you up and not leave you hungry shortly after a meal. This information is not intended to replace advice given to you by your health care provider. Make sure you discuss any questions you have with your health care provider. Document Revised: 05/31/2019 Document Reviewed: 05/31/2019 Elsevier Patient Education  2023 Else[employer] Inc.  

## 2021-12-17 NOTE — Progress Notes (Signed)
Subjective:    Patient ID: Rita Barry, female    DOB: 05/28/1965, 56 y.o.   MRN: [id number]  Chief Complaint  Patient presents with   Medical Management of Chronic Issues   PT presents to the office today for CPE without pap and chronic follow up. She has fibromyalgia that is stable. [place] and states she has generalized pain in the evenings of 4-5 out 10.    She reports she stopped her Ultram.  Her horse stepped on her foot and fractured on left foot 08/2021.  Since the fracture of her foot, she has not been as active as usually and has gained 10-15 lbs. Requesting medication to help.  Hypertension This is a chronic problem. The current episode started more than 1 year ago. The problem has been resolved since onset. The problem is controlled. Associated symptoms include anxiety, malaise/fatigue and peripheral edema (left foot). Pertinent negatives include no shortness of breath. Risk factors for coronary artery disease include dyslipidemia, obesity and sedentary lifestyle. The current treatment provides moderate improvement.  Anxiety Presents for follow-up visit. Symptoms include depressed mood, excessive worry, nervous/anxious behavior and restlessness. Patient reports no irritability or shortness of breath. Symptoms occur occasionally. The severity of symptoms is moderate.    Hyperlipidemia This is a chronic problem. The current episode started more than 1 year ago. The problem is uncontrolled. Pertinent negatives include no shortness of breath. Current antihyperlipidemic treatment includes diet change. The current treatment provides mild improvement of lipids. Risk factors for coronary artery disease include dyslipidemia, hypertension, a sedentary lifestyle and post-menopausal.  Depression        This is a chronic problem.  The current episode started more than 1 year ago.   Associated symptoms include restlessness.  Associated symptoms include no  helplessness, no hopelessness and not sad.  Past medical history includes anxiety.       Review of Systems  Constitutional:  Positive for malaise/fatigue. Negative for irritability.  Respiratory:  Negative for shortness of breath.   Psychiatric/Behavioral:  Positive for depression. The patient is nervous/anxious.   All other systems reviewed and are negative.  Family History  Problem Relation Age of Onset   Cancer Father        Throat   Hypertension Mother    Breast cancer Mother    Diabetes Maternal Grandmother    Social History   Socioeconomic History   Marital status: Married    Spouse name: Not on file   Number of children: Not on file   Years of education: Not on file   Highest education level: Not on file  Occupational History   Occupation: Works with Horses  Tobacco Use   Smoking status: Never   Smokeless tobacco: Never  Vaping Use   Vaping Use: Never used  Substance and Sexual Activity   Alcohol use: Yes    Comment: 2-3 glassses of wine weekly   Drug use: No   Sexual activity: Yes  Other Topics Concern   Not on file  Social History Narrative   Not on file   Social Determinants of Health   Financial Resource Strain: Not on file  Food Insecurity: Not on file  Transportation Needs: Not on file  Physical Activity: Not on file  Stress: Not on file  Social Connections: Not on file        Objective:   Physical Exam Vitals reviewed.  Constitutional:      General: She is  not in acute distress.    Appearance: She is well-developed.  HENT:     Head: Normocephalic and atraumatic.     Right Ear: Tympanic membrane normal.     Left Ear: Tympanic membrane normal.  Eyes:     Pupils: Pupils are equal, round, and reactive to light.  Neck:     Thyroid: No thyromegaly.  Cardiovascular:     Rate and Rhythm: Normal rate and regular rhythm.     Heart sounds: Normal heart sounds. No murmur heard. Pulmonary:     Effort: Pulmonary effort is normal. No  respiratory distress.     Breath sounds: Normal breath sounds. No wheezing.  Abdominal:     General: Bowel sounds are normal. There is no distension.     Palpations: Abdomen is soft.     Tenderness: There is no abdominal tenderness.  Musculoskeletal:        General: Tenderness (left left in boot) present. Normal range of motion.     Cervical back: Normal range of motion and neck supple.  Skin:    General: Skin is warm and dry.  Neurological:     Mental Status: She is alert and oriented to person, place, and time.     Cranial Nerves: No cranial nerve deficit.     Deep Tendon Reflexes: Reflexes are normal and symmetric.  Psychiatric:        Behavior: Behavior normal.        Thought Content: Thought content normal.        Judgment: Judgment normal.       BP 134/85   Pulse 79   Temp (!) 97.1 F (36.2 C) (Temporal)   Ht 5' 7.5" (1.715 m)   Wt 192 lb (87.1 kg) Comment: hurt foot  LMP 04/16/2016 (Approximate)   BMI 29.63 kg/m      Assessment & Plan:  Rita Barry comes in today with chief complaint of Medical Management of Chronic Issues   Diagnosis and orders addressed:  1. Essential hypertension - hydrochlorothiazide (MICROZIDE) 12.5 MG capsule; TAKE 1 CAPSULE BY MOUTH EVERY DAY  Dispense: 90 capsule; Refill: 1 - CMP14+EGFR - CBC with Differential/Platelet  2. Lupus anticoagulant syndrome (HCC) - CMP14+EGFR - CBC with Differential/Platelet  3. Vitamin D deficiency - CMP14+EGFR - CBC with Differential/Platelet - VITAMIN D 25 Hydroxy (Vit-D Deficiency, Fractures)  4. Hyperlipidemia, unspecified hyperlipidemia type - CMP14+EGFR - CBC with Differential/Platelet - Lipid panel  5. Moderate episode of recurrent major depressive disorder (HCC) - escitalopram (LEXAPRO) 20 MG tablet; Take 1 tablet (20 mg total) by mouth daily.  Dispense: 90 tablet; Refill: 1 - CMP14+EGFR - CBC with Differential/Platelet  6. Fibromyalgia - CMP14+EGFR - CBC with  Differential/Platelet  7. GAD (generalized anxiety disorder) - escitalopram (LEXAPRO) 20 MG tablet; Take 1 tablet (20 mg total) by mouth daily.  Dispense: 90 tablet; Refill: 1 - CMP14+EGFR - CBC with Differential/Platelet  8. Annual physical exam - CMP14+EGFR - CBC with Differential/Platelet - Lipid panel - TSH - VITAMIN D 25 Hydroxy (Vit-D Deficiency, Fractures)  9. Overweight (BMI 25.0-29.9)  10. Weight loss counseling, encounter for Start phentermine, start 1/2 tab Avoid caffeine  Encourage healthy diet and exercise   Labs pending Health Maintenance reviewed Diet and exercise encouraged  Follow up plan: 3 months    [name], FNP

## 2021-12-18 ENCOUNTER — Telehealth: Payer: Self-pay | Admitting: Family

## 2021-12-18 LAB — CBC WITH DIFFERENTIAL/PLATELET
Basophils Absolute: 0.1 10*3/uL (ref 0.0–0.2)
Basos: 1 %
EOS (ABSOLUTE): 1.4 10*3/uL — ABNORMAL HIGH (ref 0.0–0.4)
Eos: 21 %
Hematocrit: 37.6 % (ref 34.0–46.6)
Hemoglobin: 13.1 g/dL (ref 11.1–15.9)
Immature Grans (Abs): 0 10*3/uL (ref 0.0–0.1)
Immature Granulocytes: 0 %
Lymphocytes Absolute: 1.5 10*3/uL (ref 0.7–3.1)
Lymphs: 23 %
MCH: 33.6 pg — ABNORMAL HIGH (ref 26.6–33.0)
MCHC: 34.8 g/dL (ref 31.5–35.7)
MCV: 96 fL (ref 79–97)
Monocytes Absolute: 0.3 10*3/uL (ref 0.1–0.9)
Monocytes: 5 %
Neutrophils Absolute: 3.1 10*3/uL (ref 1.4–7.0)
Neutrophils: 50 %
Platelets: 321 10*3/uL (ref 150–450)
RBC: 3.9 x10E6/uL (ref 3.77–5.28)
RDW: 12.2 % (ref 11.7–15.4)
WBC: 6.4 10*3/uL (ref 3.4–10.8)

## 2021-12-18 LAB — CMP14+EGFR
ALT: 51 IU/L — ABNORMAL HIGH (ref 0–32)
AST: 44 IU/L — ABNORMAL HIGH (ref 0–40)
Albumin/Globulin Ratio: 2.1 (ref 1.2–2.2)
Albumin: 4.6 g/dL (ref 3.8–4.9)
Alkaline Phosphatase: 35 IU/L — ABNORMAL LOW (ref 44–121)
BUN/Creatinine Ratio: 16 (ref 9–23)
BUN: 12 mg/dL (ref 6–24)
Bilirubin Total: 0.7 mg/dL (ref 0.0–1.2)
CO2: 22 mmol/L (ref 20–29)
Calcium: 9.9 mg/dL (ref 8.7–10.2)
Chloride: 99 mmol/L (ref 96–106)
Creatinine, Ser: 0.76 mg/dL (ref 0.57–1.00)
Globulin, Total: 2.2 g/dL (ref 1.5–4.5)
Glucose: 121 mg/dL — ABNORMAL HIGH (ref 70–99)
Potassium: 4.6 mmol/L (ref 3.5–5.2)
Sodium: 138 mmol/L (ref 134–144)
Total Protein: 6.8 g/dL (ref 6.0–8.5)
eGFR: 92 mL/min/{1.73_m2} (ref >59)

## 2021-12-18 LAB — LIPID PANEL
Chol/HDL Ratio: 4.8 ratio — ABNORMAL HIGH (ref 0.0–4.4)
Cholesterol, Total: 281 mg/dL — ABNORMAL HIGH (ref 100–199)
HDL: 59 mg/dL (ref >39)
LDL Chol Calc (NIH): 175 mg/dL — ABNORMAL HIGH (ref 0–99)
Triglycerides: 250 mg/dL — ABNORMAL HIGH (ref 0–149)
VLDL Cholesterol Cal: 47 mg/dL — ABNORMAL HIGH (ref 5–40)

## 2021-12-18 LAB — VITAMIN D 25 HYDROXY (VIT D DEFICIENCY, FRACTURES): Vit D, 25-Hydroxy: 37.1 ng/mL (ref 30.0–100.0)

## 2021-12-18 LAB — TSH: TSH: 1.94 u[IU]/mL (ref 0.450–4.500)

## 2021-12-18 NOTE — Telephone Encounter (Signed)
Patient wants to discuss lab work. Please call back.

## 2021-12-18 NOTE — Telephone Encounter (Signed)
Patient aware and verbalized understanding. °

## 2022-01-05 DIAGNOSIS — M79672 Pain in left foot: Secondary | ICD-10-CM | POA: Diagnosis not present

## 2022-01-05 DIAGNOSIS — M89371 Hypertrophy of bone, right ankle and foot: Secondary | ICD-10-CM | POA: Diagnosis not present

## 2022-02-02 DIAGNOSIS — M79672 Pain in left foot: Secondary | ICD-10-CM | POA: Diagnosis not present

## 2022-02-02 DIAGNOSIS — M89371 Hypertrophy of bone, right ankle and foot: Secondary | ICD-10-CM | POA: Diagnosis not present

## 2022-02-04 DIAGNOSIS — H0288B Meibomian gland dysfunction left eye, upper and lower eyelids: Secondary | ICD-10-CM | POA: Diagnosis not present

## 2022-02-04 DIAGNOSIS — H0288A Meibomian gland dysfunction right eye, upper and lower eyelids: Secondary | ICD-10-CM | POA: Diagnosis not present

## 2022-02-04 DIAGNOSIS — H0102B Squamous blepharitis left eye, upper and lower eyelids: Secondary | ICD-10-CM | POA: Diagnosis not present

## 2022-02-04 DIAGNOSIS — H16223 Keratoconjunctivitis sicca, not specified as Sjogren's, bilateral: Secondary | ICD-10-CM | POA: Diagnosis not present

## 2022-02-04 DIAGNOSIS — H0102A Squamous blepharitis right eye, upper and lower eyelids: Secondary | ICD-10-CM | POA: Diagnosis not present

## 2022-03-19 ENCOUNTER — Ambulatory Visit: Payer: Federal, State, Local not specified - PPO | Admitting: Family

## 2022-03-19 ENCOUNTER — Encounter: Payer: Self-pay | Admitting: Family

## 2022-03-19 VITALS — BP 137/78 | HR 85 | Temp 97.8°F | Ht 67.5 in | Wt 199.4 lb

## 2022-03-19 DIAGNOSIS — Z713 Dietary counseling and surveillance: Secondary | ICD-10-CM

## 2022-03-19 DIAGNOSIS — F331 Major depressive disorder, recurrent, moderate: Secondary | ICD-10-CM

## 2022-03-19 DIAGNOSIS — M797 Fibromyalgia: Secondary | ICD-10-CM

## 2022-03-19 DIAGNOSIS — E785 Hyperlipidemia, unspecified: Secondary | ICD-10-CM

## 2022-03-19 DIAGNOSIS — E663 Overweight: Secondary | ICD-10-CM | POA: Insufficient documentation

## 2022-03-19 DIAGNOSIS — Z6830 Body mass index (BMI) 30.0-30.9, adult: Secondary | ICD-10-CM

## 2022-03-19 DIAGNOSIS — F411 Generalized anxiety disorder: Secondary | ICD-10-CM

## 2022-03-19 DIAGNOSIS — E559 Vitamin D deficiency, unspecified: Secondary | ICD-10-CM | POA: Diagnosis not present

## 2022-03-19 DIAGNOSIS — I10 Essential (primary) hypertension: Secondary | ICD-10-CM

## 2022-03-19 DIAGNOSIS — E6609 Other obesity due to excess calories: Secondary | ICD-10-CM

## 2022-03-19 LAB — CMP14+EGFR
ALT: 31 IU/L (ref 0–32)
AST: 35 IU/L (ref 0–40)
Albumin/Globulin Ratio: 2.2 (ref 1.2–2.2)
Albumin: 4.4 g/dL (ref 3.8–4.9)
Alkaline Phosphatase: 31 IU/L — ABNORMAL LOW (ref 44–121)
BUN/Creatinine Ratio: 21 (ref 9–23)
BUN: 17 mg/dL (ref 6–24)
Bilirubin Total: 0.5 mg/dL (ref 0.0–1.2)
CO2: 26 mmol/L (ref 20–29)
Calcium: 9.5 mg/dL (ref 8.7–10.2)
Chloride: 98 mmol/L (ref 96–106)
Creatinine, Ser: 0.81 mg/dL (ref 0.57–1.00)
Globulin, Total: 2 g/dL (ref 1.5–4.5)
Glucose: 96 mg/dL (ref 70–99)
Potassium: 5.5 mmol/L — ABNORMAL HIGH (ref 3.5–5.2)
Sodium: 136 mmol/L (ref 134–144)
Total Protein: 6.4 g/dL (ref 6.0–8.5)
eGFR: 85 mL/min/{1.73_m2} (ref >59)

## 2022-03-19 MED ORDER — SEMAGLUTIDE-WEIGHT MANAGEMENT 0.5 MG/0.5ML ~~LOC~~ SOAJ: 0.5000 mg | SUBCUTANEOUS | 0 refills | Status: AC

## 2022-03-19 MED ORDER — SEMAGLUTIDE-WEIGHT MANAGEMENT 1 MG/0.5ML ~~LOC~~ SOAJ: 1.0000 mg | SUBCUTANEOUS | 0 refills | Status: DC

## 2022-03-19 MED ORDER — SEMAGLUTIDE-WEIGHT MANAGEMENT 0.25 MG/0.5ML ~~LOC~~ SOAJ: 0.2500 mg | SUBCUTANEOUS | 0 refills | Status: AC

## 2022-03-19 NOTE — Progress Notes (Signed)
Subjective:    Patient ID: Rita Barry, female    DOB: Mar 19, 1966, 56 y.o.   MRN: [id number]  Chief Complaint  Patient presents with   Medical Management of Chronic Issues    Would like to try something for injection.   PT presents to the office today for  chronic follow up. She has fibromyalgia that is stable. [place] and is very active and states she has generalized pain in the evenings of 4-5 out 10.    She reports she stopped her Ultram.   Her horse stepped on her foot and fractured on left foot 08/2021.   Since the fracture of her foot, she has not been as active as usually and has gained 10-15 lbs. Requesting medication to help. She tried phentermine, but states this did not help.      03/19/2022   10:46 AM 12/17/2021   10:08 AM 10/02/2021    9:26 AM  Last 3 Weights  Weight (lbs) 199 lb 6.4 oz 192 lb 193 lb  Weight (kg) 90.447 kg 87.091 kg 87.544 kg     Hypertension This is a chronic problem. The current episode started more than 1 year ago. The problem has been waxing and waning since onset. The problem is uncontrolled. Associated symptoms include anxiety. Pertinent negatives include no malaise/fatigue, palpitations, peripheral edema or shortness of breath. Risk factors for coronary artery disease include dyslipidemia and sedentary lifestyle. The current treatment provides moderate improvement. There is no history of heart failure.  Gastroesophageal Reflux She complains of belching and heartburn. This is a chronic problem. The current episode started more than 1 year ago. She has tried a PPI, an antacid and a diet change for the symptoms. The treatment provided mild relief.  Anxiety Presents for follow-up visit. Patient reports no excessive worry, irritability, nervous/anxious behavior, palpitations or shortness of breath. Symptoms occur rarely. The severity of symptoms is mild.    Hyperlipidemia This is a chronic problem. The current episode started more  than 1 year ago. The problem is uncontrolled. Recent lipid tests were reviewed and are high. Exacerbating diseases include obesity. Pertinent negatives include no shortness of breath. Current antihyperlipidemic treatment includes statins. The current treatment provides moderate improvement of lipids. Risk factors for coronary artery disease include dyslipidemia, hypertension, a sedentary lifestyle and post-menopausal.  Depression        This is a chronic problem.  The current episode started more than 1 year ago.   The problem occurs intermittently.  Associated symptoms include no helplessness, no hopelessness, not irritable and not sad.  Past treatments include SSRIs - Selective serotonin reuptake inhibitors.  Past medical history includes anxiety.       Review of Systems  Constitutional:  Negative for irritability and malaise/fatigue.  Respiratory:  Negative for shortness of breath.   Cardiovascular:  Negative for palpitations.  Gastrointestinal:  Positive for heartburn.  Psychiatric/Behavioral:  Positive for depression. The patient is not nervous/anxious.   All other systems reviewed and are negative.      Objective:   Physical Exam Vitals reviewed.  Constitutional:      General: She is not irritable.She is not in acute distress.    Appearance: She is well-developed.  HENT:     Head: Normocephalic and atraumatic.     Right Ear: Tympanic membrane normal.     Left Ear: Tympanic membrane normal.  Eyes:     Pupils: Pupils are equal, round, and reactive to light.  Neck:  Thyroid: No thyromegaly.  Cardiovascular:     Rate and Rhythm: Normal rate and regular rhythm.     Heart sounds: Normal heart sounds. No murmur heard. Pulmonary:     Effort: Pulmonary effort is normal. No respiratory distress.     Breath sounds: Normal breath sounds. No wheezing.  Abdominal:     General: Bowel sounds are normal. There is no distension.     Palpations: Abdomen is soft.     Tenderness: There is  no abdominal tenderness.  Musculoskeletal:        General: No tenderness. Normal range of motion.     Cervical back: Normal range of motion and neck supple.  Skin:    General: Skin is warm and dry.  Neurological:     Mental Status: She is alert and oriented to person, place, and time.     Cranial Nerves: No cranial nerve deficit.     Deep Tendon Reflexes: Reflexes are normal and symmetric.  Psychiatric:        Behavior: Behavior normal.        Thought Content: Thought content normal.        Judgment: Judgment normal.      BP 137/78   Pulse 85   Temp 97.8 F (36.6 C) (Temporal)   Ht 5' 7.5" (1.715 m)   Wt 199 lb 6.4 oz (90.4 kg)   LMP 04/16/2016 (Approximate)   BMI 30.77 kg/m       Assessment & Plan:   ANIKAH HOGGE comes in today with chief complaint of Medical Management of Chronic Issues (Would like to try something for injection.)   Diagnosis and orders addressed:  1. Moderate episode of recurrent major depressive disorder (HCC) - CMP14+EGFR  2. Vitamin D deficiency - CMP14+EGFR  3. GAD (generalized anxiety disorder) - CMP14+EGFR  4. Hyperlipidemia, unspecified hyperlipidemia type - CMP14+EGFR - Semaglutide-Weight Management 0.25 MG/0.5ML SOAJ; Inject 0.25 mg into the skin once a week for 28 days.  Dispense: 2 mL; Refill: 0 - Semaglutide-Weight Management 0.5 MG/0.5ML SOAJ; Inject 0.5 mg into the skin once a week for 28 days.  Dispense: 2 mL; Refill: 0 - Semaglutide-Weight Management 1 MG/0.5ML SOAJ; Inject 1 mg into the skin once a week for 28 days.  Dispense: 2 mL; Refill: 0  5. Fibromyalgia - CMP14+EGFR  6. Essential hypertension - CMP14+EGFR - Semaglutide-Weight Management 0.25 MG/0.5ML SOAJ; Inject 0.25 mg into the skin once a week for 28 days.  Dispense: 2 mL; Refill: 0 - Semaglutide-Weight Management 0.5 MG/0.5ML SOAJ; Inject 0.5 mg into the skin once a week for 28 days.  Dispense: 2 mL; Refill: 0 - Semaglutide-Weight Management 1 MG/0.5ML SOAJ;  Inject 1 mg into the skin once a week for 28 days.  Dispense: 2 mL; Refill: 0  7. Class 1 obesity due to excess calories with serious comorbidity and body mass index (BMI) of 30.0 to 30.9 in adult - CMP14+EGFR - Semaglutide-Weight Management 0.25 MG/0.5ML SOAJ; Inject 0.25 mg into the skin once a week for 28 days.  Dispense: 2 mL; Refill: 0 - Semaglutide-Weight Management 0.5 MG/0.5ML SOAJ; Inject 0.5 mg into the skin once a week for 28 days.  Dispense: 2 mL; Refill: 0 - Semaglutide-Weight Management 1 MG/0.5ML SOAJ; Inject 1 mg into the skin once a week for 28 days.  Dispense: 2 mL; Refill: 0  8. Weight loss counseling, encounter for Start Wegovy 0.25 mg for one month then increase to 0.5 mg and then 1 mg Encourage exercise Encourage healthy  diet  - CMP14+EGFR - Semaglutide-Weight Management 0.25 MG/0.5ML SOAJ; Inject 0.25 mg into the skin once a week for 28 days.  Dispense: 2 mL; Refill: 0 - Semaglutide-Weight Management 0.5 MG/0.5ML SOAJ; Inject 0.5 mg into the skin once a week for 28 days.  Dispense: 2 mL; Refill: 0 - Semaglutide-Weight Management 1 MG/0.5ML SOAJ; Inject 1 mg into the skin once a week for 28 days.  Dispense: 2 mL; Refill: 0   Labs pending Health Maintenance reviewed Diet and exercise encouraged  Follow up plan: 3 months    [name], FNP

## 2022-03-19 NOTE — Patient Instructions (Signed)
Calorie Counting for Weight Loss Calories are units of energy. Your body needs a certain number of calories from food to keep going throughout the day. When you eat or drink more calories than your body needs, your body stores the extra calories mostly as fat. When you eat or drink fewer calories than your body needs, your body burns fat to get the energy it needs. Calorie counting means keeping track of how many calories you eat and drink each day. Calorie counting can be helpful if you need to lose weight. If you eat fewer calories than your body needs, you should lose weight. Ask your health care provider what a healthy weight is for you. For calorie counting to work, you will need to eat the right number of calories each day to lose a healthy amount of weight per week. A dietitian can help you figure out how many calories you need in a day and will suggest ways to reach your calorie goal. A healthy amount of weight to lose each week is usually 1-2 lb (0.5-0.9 kg). This usually means that your daily calorie intake should be reduced by 500-750 calories. Eating 1,200-1,500 calories a day can help most women lose weight. Eating 1,500-1,800 calories a day can help most men lose weight. What do I need to know about calorie counting? Work with your health care provider or dietitian to determine how many calories you should get each day. To meet your daily calorie goal, you will need to: Find out how many calories are in each food that you would like to eat. Try to do this before you eat. Decide how much of the food you plan to eat. Keep a food log. Do this by writing down what you ate and how many calories it had. To successfully lose weight, it is important to balance calorie counting with a healthy lifestyle that includes regular activity. Where do I find calorie information?  The number of calories in a food can be found on a Nutrition Facts label. If a food does not have a Nutrition Facts label, try  to look up the calories online or ask your dietitian for help. Remember that calories are listed per serving. If you choose to have more than one serving of a food, you will have to multiply the calories per serving by the number of servings you plan to eat. For example, the label on a package of bread might say that a serving size is 1 slice and that there are 90 calories in a serving. If you eat 1 slice, you will have eaten 90 calories. If you eat 2 slices, you will have eaten 180 calories. How do I keep a food log? After each time that you eat, record the following in your food log as soon as possible: What you ate. Be sure to include toppings, sauces, and other extras on the food. How much you ate. This can be measured in cups, ounces, or number of items. How many calories were in each food and drink. The total number of calories in the food you ate. Keep your food log near you, such as in a pocket-sized notebook or on an app or website on your mobile phone. Some programs will calculate calories for you and show you how many calories you have left to meet your daily goal. What are some portion-control tips? Know how many calories are in a serving. This will help you know how many servings you can have of a certain   food. Use a measuring cup to measure serving sizes. You could also try weighing out portions on a kitchen scale. With time, you will be able to estimate serving sizes for some foods. Take time to put servings of different foods on your favorite plates or in your favorite bowls and cups so you know what a serving looks like. Try not to eat straight from a food's packaging, such as from a bag or box. Eating straight from the package makes it hard to see how much you are eating and can lead to overeating. Put the amount you would like to eat in a cup or on a plate to make sure you are eating the right portion. Use smaller plates, glasses, and bowls for smaller portions and to prevent  overeating. Try not to multitask. For example, avoid watching TV or using your computer while eating. If it is time to eat, sit down at a table and enjoy your food. This will help you recognize when you are full. It will also help you be more mindful of what and how much you are eating. What are tips for following this plan? Reading food labels Check the calorie count compared with the serving size. The serving size may be smaller than what you are used to eating. Check the source of the calories. Try to choose foods that are high in protein, fiber, and vitamins, and low in saturated fat, trans fat, and sodium. Shopping Read nutrition labels while you shop. This will help you make healthy decisions about which foods to buy. Pay attention to nutrition labels for low-fat or fat-free foods. These foods sometimes have the same number of calories or more calories than the full-fat versions. They also often have added sugar, starch, or salt to make up for flavor that was removed with the fat. Make a grocery list of lower-calorie foods and stick to it. Cooking Try to cook your favorite foods in a healthier way. For example, try baking instead of frying. Use low-fat dairy products. Meal planning Use more fruits and vegetables. One-half of your plate should be fruits and vegetables. Include lean proteins, such as chicken, turkey, and fish. Lifestyle Each week, aim to do one of the following: 150 minutes of moderate exercise, such as walking. 75 minutes of vigorous exercise, such as running. General information Know how many calories are in the foods you eat most often. This will help you calculate calorie counts faster. Find a way of tracking calories that works for you. Get creative. Try different apps or programs if writing down calories does not work for you. What foods should I eat?  Eat nutritious foods. It is better to have a nutritious, high-calorie food, such as an avocado, than a food with  few nutrients, such as a bag of potato chips. Use your calories on foods and drinks that will fill you up and will not leave you hungry soon after eating. Examples of foods that fill you up are nuts and nut butters, vegetables, lean proteins, and high-fiber foods such as whole grains. High-fiber foods are foods with more than 5 g of fiber per serving. Pay attention to calories in drinks. Low-calorie drinks include water and unsweetened drinks. The items listed above may not be a complete list of foods and beverages you can eat. Contact a dietitian for more information. What foods should I limit? Limit foods or drinks that are not good sources of vitamins, minerals, or protein or that are high in unhealthy fats. These   include: Candy. Other sweets. Sodas, specialty coffee drinks, alcohol, and juice. The items listed above may not be a complete list of foods and beverages you should avoid. Contact a dietitian for more information. How do I count calories when eating out? Pay attention to portions. Often, portions are much larger when eating out. Try these tips to keep portions smaller: Consider sharing a meal instead of getting your own. If you get your own meal, eat only half of it. Before you start eating, ask for a container and put half of your meal into it. When available, consider ordering smaller portions from the menu instead of full portions. Pay attention to your food and drink choices. Knowing the way food is cooked and what is included with the meal can help you eat fewer calories. If calories are listed on the menu, choose the lower-calorie options. Choose dishes that include vegetables, fruits, whole grains, low-fat dairy products, and lean proteins. Choose items that are boiled, broiled, grilled, or steamed. Avoid items that are buttered, battered, fried, or served with cream sauce. Items labeled as crispy are usually fried, unless stated otherwise. Choose water, low-fat milk,  unsweetened iced tea, or other drinks without added sugar. If you want an alcoholic beverage, choose a lower-calorie option, such as a glass of wine or light beer. Ask for dressings, sauces, and syrups on the side. These are usually high in calories, so you should limit the amount you eat. If you want a salad, choose a garden salad and ask for grilled meats. Avoid extra toppings such as bacon, cheese, or fried items. Ask for the dressing on the side, or ask for olive oil and vinegar or lemon to use as dressing. Estimate how many servings of a food you are given. Knowing serving sizes will help you be aware of how much food you are eating at restaurants. Where to find more information Centers for Disease Control and Prevention: www.cdc.gov U.[place] Department of Agriculture: myplate.gov Summary Calorie counting means keeping track of how many calories you eat and drink each day. If you eat fewer calories than your body needs, you should lose weight. A healthy amount of weight to lose per week is usually 1-2 lb (0.5-0.9 kg). This usually means reducing your daily calorie intake by 500-750 calories. The number of calories in a food can be found on a Nutrition Facts label. If a food does not have a Nutrition Facts label, try to look up the calories online or ask your dietitian for help. Use smaller plates, glasses, and bowls for smaller portions and to prevent overeating. Use your calories on foods and drinks that will fill you up and not leave you hungry shortly after a meal. This information is not intended to replace advice given to you by your health care provider. Make sure you discuss any questions you have with your health care provider. Document Revised: 05/31/2019 Document Reviewed: 05/31/2019 Elsevier Patient Education  2023 Elsevier Inc.  

## 2022-03-22 ENCOUNTER — Other Ambulatory Visit: Payer: Self-pay | Admitting: Family Medicine

## 2022-03-22 DIAGNOSIS — E875 Hyperkalemia: Secondary | ICD-10-CM

## 2022-03-23 ENCOUNTER — Telehealth: Payer: Self-pay | Admitting: Family

## 2022-03-23 ENCOUNTER — Other Ambulatory Visit: Payer: Federal, State, Local not specified - PPO

## 2022-03-23 DIAGNOSIS — E875 Hyperkalemia: Secondary | ICD-10-CM | POA: Diagnosis not present

## 2022-03-23 NOTE — Telephone Encounter (Signed)
PA for Wegovy-APPROVED    This is to inform you that your Prior Authorization request for the above member's Wegovy 0.25MG /0.5ML [place] SOAJ has been approved. If you are changing the member's therapy the previously approved therapy will be canceled and replaced. The authorization is valid from 02/21/2022 through 09/19/2022. A letter of explanation will also be mailed to the patient.    Left message on pt VM advising pt medication approved and to call pharmacy to get medication filled.

## 2022-03-23 NOTE — Telephone Encounter (Signed)
Patient checked with [facility] in [place] and they told her that the [facility] that was called in is on backorder but they have 1.7 MG and 2.4 MG  She wants to know if she would be able to take one of these. Patient is also calling other pharmacies to see if they have the 0.25MG  and will call us back to let us know.

## 2022-03-23 NOTE — Telephone Encounter (Signed)
Lmtcb.

## 2022-03-23 NOTE — Telephone Encounter (Signed)
No, that is too high. She will be sick.

## 2022-03-23 NOTE — Telephone Encounter (Signed)
Pt has called other pharmacies and they have the same situation and will wait to see if Neysa Bonito can send in 1.7 or 2.4MG  to [facility]

## 2022-03-24 LAB — BMP8+EGFR
BUN/Creatinine Ratio: 15 (ref 9–23)
BUN: 11 mg/dL (ref 6–24)
CO2: 22 mmol/L (ref 20–29)
Calcium: 9.5 mg/dL (ref 8.7–10.2)
Chloride: 103 mmol/L (ref 96–106)
Creatinine, Ser: 0.73 mg/dL (ref 0.57–1.00)
Glucose: 84 mg/dL (ref 70–99)
Potassium: 4 mmol/L (ref 3.5–5.2)
Sodium: 143 mmol/L (ref 134–144)
eGFR: 96 mL/min/{1.73_m2} (ref >59)

## 2022-03-24 NOTE — Telephone Encounter (Signed)
Pt aware that hight of a dose of Wegovy  will make her sick and he says she will just wait for them to get the lower dose back in stock.

## 2022-03-24 NOTE — Telephone Encounter (Signed)
Patient returning call. Please call back

## 2022-05-04 ENCOUNTER — Encounter: Payer: Self-pay | Admitting: Family

## 2022-05-04 ENCOUNTER — Telehealth: Payer: Federal, State, Local not specified - PPO | Admitting: Family

## 2022-05-04 DIAGNOSIS — F411 Generalized anxiety disorder: Secondary | ICD-10-CM

## 2022-05-04 DIAGNOSIS — F4321 Adjustment disorder with depressed mood: Secondary | ICD-10-CM | POA: Diagnosis not present

## 2022-05-04 DIAGNOSIS — F410 Panic disorder [episodic paroxysmal anxiety] without agoraphobia: Secondary | ICD-10-CM

## 2022-05-04 MED ORDER — ALPRAZOLAM 0.25 MG PO TABS
0.2500 mg | ORAL_TABLET | Freq: Two times a day (BID) | ORAL | 0 refills | Status: DC | PRN
Start: 2022-05-04 — End: 2022-07-02

## 2022-05-04 MED ORDER — BUSPIRONE HCL 5 MG PO TABS: 5.0000 mg | ORAL_TABLET | Freq: Three times a day (TID) | ORAL | 1 refills | Status: DC | PRN

## 2022-05-04 NOTE — Progress Notes (Signed)
Virtual Visit Consent   [name], you are scheduled for a virtual visit with a [facility] provider today. Just as with appointments in the office, your consent must be obtained to participate. Your consent will be active for this visit and any virtual visit you may have with one of our providers in the next 365 days. If you have a MyChart account, a copy of this consent can be sent to you electronically.  As this is a virtual visit, video technology does not allow for your provider to perform a traditional examination. This may limit your provider's ability to fully assess your condition. If your provider identifies any concerns that need to be evaluated in person or the need to arrange testing (such as labs, EKG, etc.), we will make arrangements to do so. Although advances in technology are sophisticated, we cannot ensure that it will always work on either your end or our end. If the connection with a video visit is poor, the visit may have to be switched to a telephone visit. With either a video or telephone visit, we are not always able to ensure that we have a secure connection.  By engaging in this virtual visit, you consent to the provision of healthcare and authorize for your insurance to be billed (if applicable) for the services provided during this visit. Depending on your insurance coverage, you may receive a charge related to this service.  I need to obtain your verbal consent now. Are you willing to proceed with your visit today? Rita Barry has provided verbal consent on 05/04/2022 for a virtual visit (video or telephone). [name], FNP  Date: 05/04/2022 11:19 AM  Virtual Visit via Video Note   I, [name], connected with  Rita Barry  ([id number], 14-Feb-1966) on 05/04/22 at  3:55 PM EST by a video-enabled telemedicine application and verified that I am speaking with the correct person using two identifiers.  Location: Patient: Virtual Visit Location Patient:  Home Provider: Virtual Visit Location Provider: Office/Clinic   I discussed the limitations of evaluation and management by telemedicine and the availability of in person appointments. The patient expressed understanding and agreed to proceed.    History of Present Illness: Rita Barry is a 57 y.o. who identifies as a female who was assigned female at birth, and is being seen today for increased anxiety. Reports her brother passed away unexpected and having panic attacks. Reports he was moving next door. She can not sleep and trying to get through the funeral.   HPI: Anxiety Presents for follow-up visit. Symptoms include depressed mood, excessive worry, insomnia, irritability, nervous/anxious behavior, palpitations, panic and restlessness. Symptoms occur constantly. The severity of symptoms is severe.      Problems:  Patient Active Problem List   Diagnosis Date Noted   Class 1 obesity due to excess calories with serious comorbidity and body mass index (BMI) of 30.0 to 30.9 in adult 03/19/2022   Trigger finger, right middle finger 10/18/2019   Esophagitis, eosinophilic 95/18/8416   Erosive gastritis    Esophageal dysphagia 02/21/2016   Essential hypertension 11/26/2015   Allergic rhinitis 08/25/2015   Vitamin D deficiency 08/23/2014   Hyperlipidemia 08/23/2014   GAD (generalized anxiety disorder) 09/18/2013   Benign paroxysmal positional vertigo 03/02/2013   Depression 08/08/2012   Fibromyalgia 08/08/2012    Allergies:  Allergies  Allergen Reactions   Penicillins Swelling    As a child. Has patient had a PCN reaction causing immediate rash, facial/tongue/throat swelling,  SOB or lightheadedness with hypotension: Yes Has patient had a PCN reaction causing severe rash involving mucus membranes or skin necrosis: No Has patient had a PCN reaction that required hospitalization: No Has patient had a PCN reaction occurring within the last 10 years: No If all of the above answers are  "NO", then may proceed with Ce[facility]os   Medications:  Current Outpatient Medications:    ALPRAZolam (XANAX) 0.25 MG tablet, Take 1 tablet (0.25 mg total) by mouth 2 (two) times daily as needed for anxiety., Disp: 20 tablet, Rfl: 0   busPIRone (BUSPAR) 5 MG tablet, Take 1 tablet (5 mg total) by mouth 3 (three) times daily as needed., Disp: 90 tablet, Rfl: 1   albuterol (VENTOLIN HFA) 108 (90 Base) MCG/ACT inhaler, TAKE 2 PUFFS BY MOUTH EVERY 6 HOURS AS NEEDED FOR WHEEZE OR SHORTNESS OF BREATH (Patient taking differently: Inhale 1-2 puffs into the lungs every 6 (six) hours as needed for wheezing or shortness of breath.), Disp: 18 each, Rfl: 2   escitalopram (LEXAPRO) 20 MG tablet, Take 1 tablet (20 mg total) by mouth daily., Disp: 90 tablet, Rfl: 1   fluticasone (FLONASE) 50 MCG/ACT nasal spray, PLACE 2 SPRAYS INTO THE NOSE DAILY. (Patient taking differently: Place 2 sprays into both nostrils daily.), Disp: 48 mL, Rfl: 1   hydrochlorothiazide (MICROZIDE) 12.5 MG capsule, TAKE 1 CAPSULE BY MOUTH EVERY DAY, Disp: 90 capsule, Rfl: 1   Multiple Vitamins-Minerals (WOMENS MULTI VITAMIN & MINERAL PO), Take 1 tablet by mouth daily., Disp: , Rfl:    naproxen (NAPROSYN) 500 MG tablet, TAKE 1 TABLET BY MOUTH 2 TIMES DAILY WITH A MEAL., Disp: 60 tablet, Rfl: 0   Semaglutide-Weight Management 0.5 MG/0.5ML SOAJ, Inject 0.5 mg into the skin once a week for 28 days., Disp: 2 mL, Rfl: 0   [START ON 05/16/2022] Semaglutide-Weight Management 1 MG/0.5ML SOAJ, Inject 1 mg into the skin once a week for 28 days., Disp: 2 mL, Rfl: 0   Vitamin D, Cholecalciferol, 1000 units CAPS, Take 1,000 Units by mouth daily. , Disp: , Rfl:   Observations/Objective: Patient is well-developed, well-nourished in no acute distress.  Resting comfortably  at home.  Head is normocephalic, atraumatic.  No labored breathing.  Speech is clear and coherent with logical content.  Patient is alert and oriented at baseline.  Crying   Assessment  and Plan: 1. GAD (generalized anxiety disorder) - busPIRone (BUSPAR) 5 MG tablet; Take 1 tablet (5 mg total) by mouth 3 (three) times daily as needed.  Dispense: 90 tablet; Refill: 1 - ALPRAZolam (XANAX) 0.25 MG tablet; Take 1 tablet (0.25 mg total) by mouth 2 (two) times daily as needed for anxiety.  Dispense: 20 tablet; Refill: 0  2. Panic attack - busPIRone (BUSPAR) 5 MG tablet; Take 1 tablet (5 mg total) by mouth 3 (three) times daily as needed.  Dispense: 90 tablet; Refill: 1 - ALPRAZolam (XANAX) 0.25 MG tablet; Take 1 tablet (0.25 mg total) by mouth 2 (two) times daily as needed for anxiety.  Dispense: 20 tablet; Refill: 0  3. Grief  Start Buspar 5 mg TID prn  Will give xanax to take as needed if Buspar not working Stress management   Follow Up Instructions: I discussed the assessment and treatment plan with the patient. The patient was provided an opportunity to ask questions and all were answered. The patient agreed with the plan and demonstrated an understanding of the instructions.  A copy of instructions were sent to the patient via MyChart  unless otherwise noted below.    The patient was advised to call back or seek an in-person evaluation if the symptoms worsen or if the condition fails to improve as anticipated.  Time:  I spent 3 minutes with the patient via telehealth technology discussing the above problems/concerns.    [name], FNP

## 2022-05-26 ENCOUNTER — Other Ambulatory Visit: Payer: Self-pay | Admitting: Family

## 2022-05-26 DIAGNOSIS — F411 Generalized anxiety disorder: Secondary | ICD-10-CM

## 2022-05-26 DIAGNOSIS — F410 Panic disorder [episodic paroxysmal anxiety] without agoraphobia: Secondary | ICD-10-CM

## 2022-05-28 MED ORDER — WEGOVY 0.25 MG/0.5ML ~~LOC~~ SOAJ: 0.5000 mg | SUBCUTANEOUS | 2 refills | Status: DC

## 2022-05-28 NOTE — Addendum Note (Signed)
Addended by: [name] A on: 05/28/2022 01:37 PM   Modules accepted: Orders

## 2022-05-31 ENCOUNTER — Telehealth: Payer: Self-pay | Admitting: Family

## 2022-05-31 MED ORDER — WEGOVY 0.5 MG/0.5ML ~~LOC~~ SOAJ: 0.5000 mg | SUBCUTANEOUS | 2 refills | Status: DC

## 2022-05-31 NOTE — Telephone Encounter (Signed)
[facility] says they have Wegovy 0.25 in stock.   The confusion is does the provider want to start the patient on lowest dose 0.25 or  start the patient at 0.5 dose. They have both scripts on file. It looks as though the patient has not started [facility] due to back log.   Please advise

## 2022-05-31 NOTE — Telephone Encounter (Signed)
Martinez needs dosage and directions to be clarified for [facility].

## 2022-05-31 NOTE — Telephone Encounter (Signed)
Prescription sent to pharmacy.

## 2022-05-31 NOTE — Telephone Encounter (Signed)
Pharmacy needs to know when patient went on Wegovy.

## 2022-06-01 NOTE — Telephone Encounter (Signed)
Spoke to provider this morning. She agreed with low dose 0.25 wegovy.  [place].

## 2022-06-10 ENCOUNTER — Other Ambulatory Visit: Payer: Self-pay | Admitting: Family

## 2022-06-10 DIAGNOSIS — F331 Major depressive disorder, recurrent, moderate: Secondary | ICD-10-CM

## 2022-06-10 DIAGNOSIS — I10 Essential (primary) hypertension: Secondary | ICD-10-CM

## 2022-06-10 DIAGNOSIS — F411 Generalized anxiety disorder: Secondary | ICD-10-CM

## 2022-06-21 ENCOUNTER — Ambulatory Visit: Payer: Federal, State, Local not specified - PPO | Admitting: Family

## 2022-06-25 MED ORDER — WEGOVY 0.5 MG/0.5ML ~~LOC~~ SOAJ: 0.5000 mg | SUBCUTANEOUS | 2 refills | Status: DC

## 2022-06-30 ENCOUNTER — Telehealth: Payer: Self-pay | Admitting: Family

## 2022-06-30 NOTE — Telephone Encounter (Signed)
Called and spoke to patient she is aware if miss does by a few days want hurt but if she misses by a week to stay at same does before she goes up

## 2022-07-02 ENCOUNTER — Ambulatory Visit: Payer: Federal, State, Local not specified - PPO | Admitting: Family

## 2022-07-02 ENCOUNTER — Ambulatory Visit (INDEPENDENT_AMBULATORY_CARE_PROVIDER_SITE_OTHER): Payer: Federal, State, Local not specified - PPO

## 2022-07-02 ENCOUNTER — Encounter: Payer: Self-pay | Admitting: Family

## 2022-07-02 VITALS — BP 128/87 | HR 84 | Temp 97.6°F | Ht 67.5 in | Wt 189.0 lb

## 2022-07-02 DIAGNOSIS — F411 Generalized anxiety disorder: Secondary | ICD-10-CM

## 2022-07-02 DIAGNOSIS — E785 Hyperlipidemia, unspecified: Secondary | ICD-10-CM | POA: Diagnosis not present

## 2022-07-02 DIAGNOSIS — M25552 Pain in left hip: Secondary | ICD-10-CM

## 2022-07-02 DIAGNOSIS — M797 Fibromyalgia: Secondary | ICD-10-CM

## 2022-07-02 DIAGNOSIS — Z6830 Body mass index (BMI) 30.0-30.9, adult: Secondary | ICD-10-CM

## 2022-07-02 DIAGNOSIS — F331 Major depressive disorder, recurrent, moderate: Secondary | ICD-10-CM

## 2022-07-02 DIAGNOSIS — E559 Vitamin D deficiency, unspecified: Secondary | ICD-10-CM

## 2022-07-02 DIAGNOSIS — I10 Essential (primary) hypertension: Secondary | ICD-10-CM

## 2022-07-02 DIAGNOSIS — E6609 Other obesity due to excess calories: Secondary | ICD-10-CM

## 2022-07-02 DIAGNOSIS — Z713 Dietary counseling and surveillance: Secondary | ICD-10-CM

## 2022-07-02 DIAGNOSIS — M1612 Unilateral primary osteoarthritis, left hip: Secondary | ICD-10-CM | POA: Diagnosis not present

## 2022-07-02 DIAGNOSIS — J301 Allergic rhinitis due to pollen: Secondary | ICD-10-CM

## 2022-07-02 MED ORDER — DICLOFENAC SODIUM 75 MG PO TBEC: 75.0000 mg | DELAYED_RELEASE_TABLET | Freq: Two times a day (BID) | ORAL | 0 refills | Status: DC

## 2022-07-02 MED ORDER — PREDNISONE 10 MG (21) PO TBPK: ORAL_TABLET | ORAL | 0 refills | Status: DC

## 2022-07-02 MED ORDER — WEGOVY 1 MG/0.5ML ~~LOC~~ SOAJ: 1.0000 mg | SUBCUTANEOUS | 1 refills | Status: DC

## 2022-07-02 MED ORDER — FLUTICASONE PROPIONATE 50 MCG/ACT NA SUSP: 2.0000 | Freq: Every day | NASAL | 2 refills | Status: DC

## 2022-07-02 NOTE — Progress Notes (Signed)
Subjective:    Patient ID: Rita Barry, female    DOB: 08-08-1965, 57 y.o.   MRN: [id number]  Chief Complaint  Patient presents with   Medical Management of Chronic Issues   Back Pain   Hip Pain    Both hips left worse    PT presents to the office today for  chronic follow up. She has fibromyalgia that is stable. [place] and is very active and states she has generalized pain in the evenings of 8 out 10.    She reports she stopped her Ultram.   She is currently taking Ozempic 0.5 mg. She has lost 10 lbs.      07/02/2022    9:03 AM 03/19/2022   10:46 AM 12/17/2021   10:08 AM  Last 3 Weights  Weight (lbs) 189 lb 199 lb 6.4 oz 192 lb  Weight (kg) 85.73 kg 90.447 kg 87.091 kg    Back Pain This is a chronic problem. The current episode started more than 1 year ago. The problem occurs intermittently. The problem has been waxing and waning since onset. The pain is present in the lumbar spine. The quality of the pain is described as aching. The pain is at a severity of 8/10. The pain is mild. The symptoms are aggravated by bending. Pertinent negatives include no numbness or tingling.  Hip Pain  The incident occurred more than 1 week ago. There was no injury mechanism. The pain is present in the left hip. The quality of the pain is described as aching. The pain is at a severity of 8/10. The pain has been Intermittent since onset. Pertinent negatives include no loss of motion, numbness or tingling. She reports no foreign bodies present. She has tried non-weight bearing and acetaminophen for the symptoms. The treatment provided mild relief.  Hypertension This is a chronic problem. The current episode started more than 1 year ago. The problem has been resolved since onset. The problem is controlled. Associated symptoms include anxiety and malaise/fatigue. Pertinent negatives include no peripheral edema or shortness of breath. Risk factors for coronary artery disease include  dyslipidemia and sedentary lifestyle. The current treatment provides moderate improvement.  Anxiety Presents for follow-up visit. Symptoms include excessive worry, irritability and nervous/anxious behavior. Patient reports no shortness of breath. Symptoms occur occasionally. The severity of symptoms is moderate.    Hyperlipidemia This is a chronic problem. The current episode started more than 1 year ago. Exacerbating diseases include obesity. Pertinent negatives include no shortness of breath. Current antihyperlipidemic treatment includes diet change. The current treatment provides moderate improvement of lipids. Risk factors for coronary artery disease include dyslipidemia, hypertension and a sedentary lifestyle.  Depression        This is a chronic problem.  The current episode started more than 1 year ago.   The problem occurs intermittently.  Associated symptoms include fatigue and sad.  Associated symptoms include no helplessness and no hopelessness.  Past medical history includes anxiety.       Review of Systems  Constitutional:  Positive for fatigue, irritability and malaise/fatigue.  Respiratory:  Negative for shortness of breath.   Musculoskeletal:  Positive for back pain.  Neurological:  Negative for tingling and numbness.  Psychiatric/Behavioral:  Positive for depression. The patient is nervous/anxious.   All other systems reviewed and are negative.      Objective:   Physical Exam Vitals reviewed.  Constitutional:      General: She is not in acute distress.  Appearance: She is well-developed.  HENT:     Head: Normocephalic and atraumatic.     Right Ear: Tympanic membrane normal.     Left Ear: Tympanic membrane normal.  Eyes:     Pupils: Pupils are equal, round, and reactive to light.  Neck:     Thyroid: No thyromegaly.  Cardiovascular:     Rate and Rhythm: Normal rate and regular rhythm.     Heart sounds: Normal heart sounds. No murmur heard. Pulmonary:      Effort: Pulmonary effort is normal. No respiratory distress.     Breath sounds: Normal breath sounds. No wheezing.  Abdominal:     General: Bowel sounds are normal. There is no distension.     Palpations: Abdomen is soft.     Tenderness: There is no abdominal tenderness.  Musculoskeletal:        General: Tenderness (pain in left hip with internal and external rotation) present. Normal range of motion.     Cervical back: Normal range of motion and neck supple.  Skin:    General: Skin is warm and dry.  Neurological:     Mental Status: She is alert and oriented to person, place, and time.     Cranial Nerves: No cranial nerve deficit.     Deep Tendon Reflexes: Reflexes are normal and symmetric.  Psychiatric:        Behavior: Behavior normal.        Thought Content: Thought content normal.        Judgment: Judgment normal.       BP 128/87   Pulse 84   Temp 97.6 F (36.4 C) (Temporal)   Ht 5' 7.5" (1.715 m)   Wt 189 lb (85.7 kg)   LMP 04/16/2016 (Approximate)   SpO2 94%   BMI 29.16 kg/m      Assessment & Plan:  LANAUTICA WINCHELL comes in today with chief complaint of Medical Management of Chronic Issues, Back Pain, and Hip Pain (Both hips left worse)   Diagnosis and orders addressed:  1. Moderate episode of recurrent major depressive disorder ([place])  2. Fibromyalgia  3. GAD (generalized anxiety disorder)  4. Hyperlipidemia, unspecified hyperlipidemia type  5. Essential hypertension - Semaglutide-Weight Management (WEGOVY) 1 MG/0.5ML SOAJ; Inject 1 mg into the skin once a week.  Dispense: 2 mL; Refill: 1  6. Class 1 obesity due to excess calories with serious comorbidity and body mass index (BMI) of 30.0 to 30.9 in adult Will increase Wegovy to 1 mg from 0.5 mg  Encourage healthy diet and exercise  - Semaglutide-Weight Management (WEGOVY) 1 MG/0.5ML SOAJ; Inject 1 mg into the skin once a week.  Dispense: 2 mL; Refill: 1  7. Vitamin D deficiency  8. Weight loss  counseling, encounter for - Semaglutide-Weight Management (WEGOVY) 1 MG/0.5ML SOAJ; Inject 1 mg into the skin once a week.  Dispense: 2 mL; Refill: 1  9. Allergic rhinitis due to pollen, unspecified seasonality - fluticasone (FLONASE) 50 MCG/ACT nasal spray; Place 2 sprays into both nostrils daily.  Dispense: 15.8 mL; Refill: 2  10. Left hip pain Start diclofenac BID  No other NSAID's  X-ray pending, may need Ortho referral  - DG HIP UNILAT W OR W/O PELVIS 2-3 VIEWS LEFT - predniSONE (STERAPRED UNI-PAK 21 TAB) 10 MG (21) TBPK tablet; Use as directed  Dispense: 21 tablet; Refill: 0 - diclofenac (VOLTAREN) 75 MG EC tablet; Take 1 tablet (75 mg total) by mouth 2 (two) times daily.  Dispense: 30 tablet;  Refill: 0   Labs pending Health Maintenance reviewed Diet and exercise encouraged  Follow up plan: 6 months   [name], FNP

## 2022-07-02 NOTE — Patient Instructions (Signed)
Hip Pain The hip is the joint between the upper legs and the lower pelvis. The bones, cartilage, tendons, and muscles of your hip joint support your body and allow you to move around. Hip pain can range from a minor ache to severe pain in one or both of your hips. The pain may be felt on the inside of the hip joint near the groin, or on the outside near the buttocks and upper thigh. You may also have swelling or stiffness in your hip area. Follow these instructions at home: Managing pain, stiffness, and swelling     If told, put ice on the painful area. Put ice in a plastic bag. Place a towel between your skin and the bag. Leave the ice on for 20 minutes, 2-3 times a day. If told, apply heat to the affected area as often as told by your health care provider. Use the heat source that your provider recommends, such as a moist heat pack or a heating pad. Place a towel between your skin and the heat source. Leave the heat on for 20-30 minutes. If your skin turns bright red, remove the ice or heat right away to prevent skin damage. The risk of damage is higher if you cannot feel pain, heat, or cold. Activity Do exercises as told by your provider. Avoid activities that cause pain. General instructions  Take over-the-counter and prescription medicines only as told by your provider. Keep a journal of your symptoms. Write down: How often you have hip pain. The location of your pain. What the pain feels like. What makes the pain worse. Sleep with a pillow between your legs on your most comfortable side. Keep all follow-up visits. Your provider will monitor your pain and activity. Contact a health care provider if: You cannot put weight on your leg. Your pain or swelling gets worse after a week. It gets harder to walk. You have a fever. Get help right away if: You fall. You have a sudden increase in pain and swelling in your hip. Your hip is red or swollen or very tender to touch. This  information is not intended to replace advice given to you by your health care provider. Make sure you discuss any questions you have with your health care provider. Document Revised: 12/22/2021 Document Reviewed: 12/22/2021 Elsevier Patient Education  [place].

## 2022-07-06 ENCOUNTER — Other Ambulatory Visit: Payer: Self-pay | Admitting: Family

## 2022-07-06 DIAGNOSIS — M25552 Pain in left hip: Secondary | ICD-10-CM

## 2022-07-08 ENCOUNTER — Other Ambulatory Visit: Payer: Self-pay | Admitting: Family

## 2022-07-29 DIAGNOSIS — M25552 Pain in left hip: Secondary | ICD-10-CM | POA: Diagnosis not present

## 2022-07-29 DIAGNOSIS — M545 Low back pain, unspecified: Secondary | ICD-10-CM | POA: Diagnosis not present

## 2022-08-09 DIAGNOSIS — Z1231 Encounter for screening mammogram for malignant neoplasm of breast: Secondary | ICD-10-CM | POA: Diagnosis not present

## 2022-08-11 ENCOUNTER — Ambulatory Visit: Payer: Federal, State, Local not specified - PPO | Attending: Student

## 2022-08-11 DIAGNOSIS — M5416 Radiculopathy, lumbar region: Secondary | ICD-10-CM | POA: Insufficient documentation

## 2022-08-11 NOTE — Therapy (Signed)
OUTPATIENT PHYSICAL THERAPY THORACOLUMBAR EVALUATION   Patient Name: Rita Barry MRN: [id number] DOB:Sep 18, 1965, 57 y.o., female Today's Date: 08/11/2022  END OF SESSION:  PT End of Session - 08/11/22 1252     Visit Number 1    Number of Visits 8    Date for PT Re-Evaluation 09/10/22    PT Start Time 1300    PT Stop Time 1345    PT Time Calculation (min) 45 min    Activity Tolerance Patient tolerated treatment well    [facility] tasks assessed/performed             Past Medical History:  Diagnosis Date   Anxiety    Depression    DVT (deep venous thrombosis)    Right calf   Esophagitis, 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   EGD FEB 2018 35 CM 22 HPF, 20 CM 23 HPF   Fibromyalgia    Questionable   Past Surgical History:  Procedure Laterality Date   COLONOSCOPY N/A 09/01/2015   Procedure: COLONOSCOPY;  Surgeon: West BaliSandi L Fields, MD;  Location: [facility];  Service: Endoscopy;  Laterality: N/A;  9:45 AM   ESOPHAGOGASTRODUODENOSCOPY N/A 06/04/2016   Procedure: ESOPHAGOGASTRODUODENOSCOPY (EGD);  Surgeon: West BaliSandi L Fields, MD;  Location: [facility];  Service: Endoscopy;  Laterality: N/A;  10:30 am   FINGER SURGERY     SAVORY DILATION N/A 06/04/2016   Procedure: SAVORY DILATION;  Surgeon: West BaliSandi L Fields, MD;  Location: [facility];  Service: Endoscopy;  Laterality: N/A;   SHOULDER SURGERY     Left   SHOULDER SURGERY Left    x2 surgeries   Patient Active Problem List   Diagnosis Date Noted   Class 1 obesity due to excess calories with serious comorbidity and body mass index (BMI) of 30.0 to 30.9 in adult 03/19/2022   Trigger finger, right middle finger 10/18/2019   Esophagitis, 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   Erosive gastritis    Esophageal dysphagia 02/21/2016   Essential hypertension 11/26/2015   Allergic rhinitis 08/25/2015   Vitamin D deficiency 08/23/2014   Hyperlipidemia 08/23/2014   GAD (generalized anxiety disorder) 09/18/2013   Benign paroxysmal  positional vertigo 03/02/2013   Depression 08/08/2012   Fibromyalgia 08/08/2012    PCP: Junie SpencerHawks, Christy A, FNP  REFERRING PROVIDER: Derenda FennelEdmisten, Kristie L, PA   REFERRING DIAG: Low back pain, unspecified   Rationale for Evaluation and Treatment: Rehabilitation  THERAPY DIAG:  Radiculopathy, lumbar region  ONSET DATE: about 2 months ago  SUBJECTIVE:  SUBJECTIVE STATEMENT: Patient reports that her back and left hip have been bothering her for about two months. She feels that it may have been caused by falling off a horse three different times and landing on her back and sacrum. However, it has not been helped by having to lift her mother. She feels that her pain has been getting worse since it first started .   PERTINENT HISTORY:  Hypertension, anxiety, depression, fibromyalgia, and vertigo  PAIN:  Are you having pain? Yes: NPRS scale: 7-8/10 Pain location: low back and left hip Pain description: intermittent, throbbing Aggravating factors: prolonged sitting, left side lying, and transfers Relieving factors: pain reduces with movement and medication  PRECAUTIONS: None  WEIGHT BEARING RESTRICTIONS: No  FALLS:  Has patient fallen in last 6 months? 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  PLOF: Independent  PATIENT GOALS: reduced pain, improved sleep (would sleep on her left side prior to her pain, but unable currently) and ease with transfers  NEXT MD VISIT: none scheduled  OBJECTIVE:   PATIENT SURVEYS:  FOTO to be completed at first follow-up as able  SCREENING FOR  RED FLAGS: Bowel or bladder incontinence: No Spinal tumors: No Cauda equina syndrome: No Compression fracture: No Abdominal aneurysm: No  COGNITION: Overall cognitive status: Within functional limits for tasks assessed     SENSATION: Patient reports no numbness or tingling.   MUSCLE LENGTH: Hamstrings: No deficits bilaterally  POSTURE: forward head  PALPATION: TTP: left piriformis  LUMBAR JOINT MOBILITY:  L3-5; mild hypomobility and "sore"   LUMBAR ROM:   AROM eval  Flexion 68  Extension 18; familiar low back and hip pain   Right lateral flexion 25% limited; slight pain  Left lateral flexion 25% limited; increased pain  Right rotation WFL   Left rotation WFL; slight left hip pain   (Blank rows = not tested)  LOWER EXTREMITY ROM: WFL for activities assessed  LOWER EXTREMITY MMT:    MMT Right eval Left eval  Hip flexion 4+/5 4/5; burning  Hip extension    Hip abduction    Hip adduction    Hip internal rotation    Hip external rotation    Knee flexion 4/5 4-/5; familiar pain  Knee extension 4+/5 5/5  Ankle dorsiflexion 4/5 4/5  Ankle plantarflexion    Ankle inversion    Ankle eversion     (Blank rows = not tested)  LUMBAR SPECIAL TESTS:  Straight leg raise test: Negative and FABER test: Positive  GAIT: Assistive device utilized: None Level of assistance: Complete Independence Comments: no significant gait deviations  TODAY'S TREATMENT:  DATE:                                     4/10 EXERCISE LOG  Exercise Repetitions and Resistance Comments  Bridge  10 reps   Piriformis stretch  3 x 30 seconds each   Lower trunk rotation 10 reps each            Blank cell = exercise not performed today   PATIENT EDUCATION:  Education details: Plan of care, healing, prognosis, HEP, and goals for therapy Person educated:  Patient Education method: Explanation Education comprehension: verbalized understanding  HOME EXERCISE PROGRAM: [id number]  ASSESSMENT:  CLINICAL IMPRESSION: Patient is a 57 y.o. female who was seen today for physical therapy evaluation and treatment for left lumbar radiculopathy.  She presented with moderate pain severity and irritability with lumbar extension and left knee flexion manual muscle testing being the most aggravating to her familiar symptoms.  She was provided a home exercise program which she was able to properly demonstrate.  She reported feeling comfortable performing these interventions.  Recommend that she continue with skilled physical therapy to address her impairments to return to her prior level of function.  OBJECTIVE IMPAIRMENTS: decreased activity tolerance, decreased mobility, decreased ROM, decreased strength, hypomobility, impaired tone, and pain.   ACTIVITY LIMITATIONS: sitting, sleeping, and transfers  PARTICIPATION LIMITATIONS: occupation  PERSONAL FACTORS: 3+ comorbidities: Hypertension, anxiety, depression, fibromyalgia, and vertigo  are also affecting patient's functional outcome.   REHAB POTENTIAL: Good  CLINICAL DECISION MAKING: Evolving/moderate complexity  EVALUATION COMPLEXITY: Moderate   GOALS: Goals reviewed with patient? Yes  LONG TERM GOALS: Target date: 09/08/22  Patient will be independent with her HEP. Baseline:  Goal status: INITIAL  2.  Patient will be able to complete her daily activities without her familiar pain exceeding 5/10. Baseline:  Goal status: INITIAL  3.  Patient will report being able to sleep throughout the night without being awakened by her familiar symptoms. Baseline:  Goal status: INITIAL  4.  Patient will improve her left knee flexor strength to at least 4/5 for improved ease with her daily activities. Baseline:  Goal status: INITIAL  PLAN:  PT FREQUENCY: 2x/week  PT DURATION: 4 weeks  PLANNED  INTERVENTIONS: Therapeutic exercises, Therapeutic activity, Neuromuscular re-education, Balance training, Patient/Family education, Self Care, Joint mobilization, Joint manipulation, Stair training, Dry Needling, Electrical stimulation, Spinal manipulation, Spinal mobilization, Cryotherapy, Moist heat, Traction, Manual therapy, and Re-evaluation.  PLAN FOR NEXT SESSION: NuStep/recumbent bike, lumbar stabilization, lumbar and lower extremity strengthening, lumbar joint mobilizations, and modalities as needed   Granville Lewis, PT 08/11/2022, 4:05 PM

## 2022-08-17 ENCOUNTER — Encounter: Payer: Self-pay | Admitting: Family

## 2022-08-17 ENCOUNTER — Ambulatory Visit: Payer: Federal, State, Local not specified - PPO

## 2022-08-17 DIAGNOSIS — M5416 Radiculopathy, lumbar region: Secondary | ICD-10-CM | POA: Diagnosis not present

## 2022-08-17 NOTE — Therapy (Signed)
OUTPATIENT PHYSICAL THERAPY THORACOLUMBAR TREATMENT   Patient Name: ANAALICIA REIMANN MRN: [id number] DOB:1966/02/14, 57 y.o., female Today's Date: 08/17/2022  END OF SESSION:  PT End of Session - 08/17/22 1422     Visit Number 2    Number of Visits 8    Date for PT Re-Evaluation 09/10/22    PT Start Time 1345    PT Stop Time 1427    PT Time Calculation (min) 42 min    Activity Tolerance Patient tolerated treatment well    Behavior During Therapy [facility] for tasks assessed/performed              Past Medical History:  Diagnosis Date   Anxiety    Depression    DVT (deep venous thrombosis)    Right calf   Esophagitis, 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   EGD FEB 2018 35 CM 22 HPF, 20 CM 23 HPF   Fibromyalgia    Questionable   Past Surgical History:  Procedure Laterality Date   COLONOSCOPY N/A 09/01/2015   Procedure: COLONOSCOPY;  Surgeon: West Bali, MD;  [facility]: [facility];  Service: Endoscopy;  Laterality: N/A;  9:45 AM   ESOPHAGOGASTRODUODENOSCOPY N/A 06/04/2016   Procedure: ESOPHAGOGASTRODUODENOSCOPY (EGD);  Surgeon: West Bali, MD;  Location: [facility];  Service: Endoscopy;  Laterality: N/A;  10:30 am   FINGER SURGERY     SAVORY DILATION N/A 06/04/2016   Procedure: SAVORY DILATION;  Surgeon: West Bali, MD;  Location: [facility];  Service: Endoscopy;  Laterality: N/A;   SHOULDER SURGERY     Left   SHOULDER SURGERY Left    x2 surgeries   Patient Active Problem List   Diagnosis Date Noted   Class 1 obesity due to excess calories with serious comorbidity and body mass index (BMI) of 30.0 to 30.9 in adult 03/19/2022   Trigger finger, right middle finger 10/18/2019   Esophagitis, 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   Erosive gastritis    Esophageal dysphagia 02/21/2016   Essential hypertension 11/26/2015   Allergic rhinitis 08/25/2015   Vitamin D deficiency 08/23/2014   Hyperlipidemia 08/23/2014   GAD (generalized anxiety disorder) 09/18/2013   Benign paroxysmal  positional vertigo 03/02/2013   Depression 08/08/2012   Fibromyalgia 08/08/2012    PCP: Junie Spencer, FNP  REFERRING PROVIDER: Derenda Fennel, PA   REFERRING DIAG: Low back pain, unspecified   Rationale for Evaluation and Treatment: Rehabilitation  THERAPY DIAG:  Radiculopathy, lumbar region  ONSET DATE: about 2 months ago  SUBJECTIVE:  SUBJECTIVE STATEMENT: Patient reports that she forgot to mention at her initial evaluation that she has some tingling and pain going from her hip to her knee when she has her left leg kicked up while she is sitting. She notes that her back is sore today when tries to pick up something.   PERTINENT HISTORY:  Hypertension, anxiety, depression, fibromyalgia, and vertigo  PAIN:  Are you having pain? Yes: NPRS scale: 3/10 Pain location: low back and left hip Pain description: intermittent, throbbing Aggravating factors: prolonged sitting, left side lying, and transfers Relieving factors: pain reduces with movement and medication  PRECAUTIONS: None  WEIGHT BEARING RESTRICTIONS: No  FALLS:  Has patient fallen in last 6 months? 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  PLOF: Independent  PATIENT GOALS: reduced pain, improved sleep (would sleep on her left side prior to her pain, but unable currently) and ease with transfers  NEXT MD VISIT: none scheduled  OBJECTIVE: all objective measures were assessed at her initial evaluation on 08/11/22 unless otherwise noted   PATIENT SURVEYS:  FOTO 67.04  SCREENING FOR RED FLAGS: Bowel or  bladder incontinence: No Spinal tumors: No Cauda equina syndrome: No Compression fracture: No Abdominal aneurysm: No  COGNITION: Overall cognitive status: Within functional limits for tasks assessed     SENSATION: Patient reports no numbness or tingling.   MUSCLE LENGTH: Hamstrings: No deficits bilaterally  POSTURE: forward head  PALPATION: TTP: left piriformis  LUMBAR JOINT MOBILITY:  L3-5; mild hypomobility and "sore"   LUMBAR ROM:   AROM eval  Flexion 68  Extension 18; familiar low back and hip pain   Right lateral flexion 25% limited; slight pain  Left lateral flexion 25% limited; increased pain  Right rotation WFL   Left rotation WFL; slight left hip pain   (Blank rows = not tested)  LOWER EXTREMITY ROM: WFL for activities assessed  LOWER EXTREMITY MMT:    MMT Right eval Left eval  Hip flexion 4+/5 4/5; burning  Hip extension    Hip abduction    Hip adduction    Hip internal rotation    Hip external rotation    Knee flexion 4/5 4-/5; familiar pain  Knee extension 4+/5 5/5  Ankle dorsiflexion 4/5 4/5  Ankle plantarflexion    Ankle inversion    Ankle eversion     (Blank rows = not tested)  LUMBAR SPECIAL TESTS:  Straight leg raise test: Negative and FABER test: Positive  GAIT: Assistive device utilized: None Level of assistance: Complete Independence Comments: no significant gait deviations  TODAY'S TREATMENT:                                                                                                                              DATE:  4/16 EXERCISE LOG  Exercise Repetitions and Resistance Comments  Isometric ball press  20 reps w/ 5 second hold    Ball roll out  3 minutes    Wood chops  Blue XTS x 2 minutes each    Seated HS stretch  3 x 30 seconds  LLE   Multifidus press out with rotation  Green t-band x 2 minutes each    Wall squat  3 minutes    Blank cell = exercise not performed today                                     4/10 EXERCISE LOG  Exercise Repetitions and Resistance Comments  Bridge  10 reps   Piriformis stretch  3 x 30 seconds each   Lower trunk rotation 10 reps each            Blank cell = exercise not performed today   PATIENT EDUCATION:  Education details: Plan of care, healing, prognosis, HEP, and goals for therapy Person educated: Patient Education method: Explanation Education comprehension: verbalized understanding  HOME EXERCISE PROGRAM: [id number] and [id number] (08/17/22)  ASSESSMENT:  CLINICAL IMPRESSION: Patient was introduced to multiple new interventions for improved lumbar strength and stability. She required minimal cueing with these new interventions for proper exercise performance. She experienced no pain or discomfort with any of today's interventions. Her HEP was updated with today's intervention and she was able to properly perform these interventions. She reported understanding these activities. She reported feeling better upon the conclusion of treatment. She continues to require skilled physical therapy to address her remaining impairments to return to her prior level of function.   OBJECTIVE IMPAIRMENTS: decreased activity tolerance, decreased mobility, decreased ROM, decreased strength, hypomobility, impaired tone, and pain.   ACTIVITY LIMITATIONS: sitting, sleeping, and transfers  PARTICIPATION LIMITATIONS: occupation  PERSONAL FACTORS: 3+ comorbidities: Hypertension, anxiety, depression, fibromyalgia, and vertigo  are also affecting patient's functional outcome.   REHAB POTENTIAL: Good  CLINICAL DECISION MAKING: Evolving/moderate complexity  EVALUATION COMPLEXITY: Moderate   GOALS: Goals reviewed with patient? Yes  LONG TERM GOALS: Target date: 09/08/22  Patient will be independent with her HEP. Baseline:  Goal status: INITIAL  2.  Patient will be able to complete her daily activities without her familiar pain exceeding  5/10. Baseline:  Goal status: INITIAL  3.  Patient will report being able to sleep throughout the night without being awakened by her familiar symptoms. Baseline:  Goal status: INITIAL  4.  Patient will improve her left knee flexor strength to at least 4/5 for improved ease with her daily activities. Baseline:  Goal status: INITIAL  PLAN:  PT FREQUENCY: 2x/week  PT DURATION: 4 weeks  PLANNED INTERVENTIONS: Therapeutic exercises, Therapeutic activity, Neuromuscular re-education, Balance training, Patient/Family education, Self Care, Joint mobilization, Joint manipulation, Stair training, Dry Needling, Electrical stimulation, Spinal manipulation, Spinal mobilization, Cryotherapy, Moist heat, Traction, Manual therapy, and Re-evaluation.  PLAN FOR NEXT SESSION: NuStep/recumbent bike, lumbar stabilization, lumbar and lower extremity strengthening, lumbar joint mobilizations, and modalities as needed   Granville Lewis, PT 08/17/2022, 3:26 PM

## 2022-08-20 MED ORDER — WEGOVY 1.7 MG/0.75ML ~~LOC~~ SOAJ: 1.7000 mg | SUBCUTANEOUS | 1 refills | Status: DC

## 2022-08-23 MED ORDER — WEGOVY 1.7 MG/0.75ML ~~LOC~~ SOAJ: 1.7000 mg | SUBCUTANEOUS | 1 refills | Status: DC

## 2022-08-23 NOTE — Addendum Note (Signed)
Addended by: Ignacia Bayley on: 08/23/2022 10:45 AM   Modules accepted: Orders

## 2022-08-26 ENCOUNTER — Other Ambulatory Visit: Payer: Self-pay | Admitting: Family

## 2022-08-26 DIAGNOSIS — F410 Panic disorder [episodic paroxysmal anxiety] without agoraphobia: Secondary | ICD-10-CM

## 2022-08-26 DIAGNOSIS — F411 Generalized anxiety disorder: Secondary | ICD-10-CM

## 2022-08-31 ENCOUNTER — Ambulatory Visit: Payer: Federal, State, Local not specified - PPO

## 2022-08-31 DIAGNOSIS — M5416 Radiculopathy, lumbar region: Secondary | ICD-10-CM | POA: Diagnosis not present

## 2022-08-31 NOTE — Therapy (Signed)
OUTPATIENT PHYSICAL THERAPY THORACOLUMBAR TREATMENT   Patient Name: Rita Barry MRN: [id number] DOB:1965-12-27, 57 y.o., female Today's Date: 08/31/2022  END OF SESSION:  PT End of Session - 08/31/22 1248     Visit Number 3    Number of Visits 8    Date for PT Re-Evaluation 09/10/22    PT Start Time 1300    PT Stop Time 1330    PT Time Calculation (min) 30 min    Activity Tolerance Patient tolerated treatment well    Behavior During Therapy [facility] for tasks assessed/performed              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   EGD FEB 2018 35 CM 22 HPF, 20 CM 23 HPF   Fibromyalgia    Questionable   Past Surgical History:  Procedure Laterality Date   COLONOSCOPY N/A 09/01/2015   Procedure: COLONOSCOPY;  Surgeon: West Bali, MD;  [facility]: [facility];  Service: Endoscopy;  Laterality: N/A;  9:45 AM   ESOPHAGOGASTRODUODENOSCOPY N/A 06/04/2016   Procedure: ESOPHAGOGASTRODUODENOSCOPY (EGD);  Surgeon: West Bali, MD;  [facility]: [facility];  Service: Endoscopy;  Laterality: N/A;  10:30 am   FINGER SURGERY     SAVORY DILATION N/A 06/04/2016   Procedure: SAVORY DILATION;  Surgeon: West Bali, MD;  [facility]: [facility];  Service: Endoscopy;  Laterality: N/A;   SHOULDER SURGERY     Left   SHOULDER SURGERY Left    x2 surgeries   Patient Active Problem List   Diagnosis Date Noted   Class 1 obesity due to excess calories with serious comorbidity and body mass index (BMI) of 30.0 to 30.9 in adult 03/19/2022   Trigger finger, right middle finger 10/18/2019   Esophagitis, 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   Erosive gastritis    Esophageal dysphagia 02/21/2016   Essential hypertension 11/26/2015   Allergic rhinitis 08/25/2015   Vitamin D deficiency 08/23/2014   Hyperlipidemia 08/23/2014   GAD (generalized anxiety disorder) 09/18/2013   Benign  paroxysmal positional vertigo 03/02/2013   Depression 08/08/2012   Fibromyalgia 08/08/2012    PCP: Junie Spencer, FNP  REFERRING PROVIDER: Derenda Fennel, PA   REFERRING DIAG: Low back pain, unspecified   Rationale for Evaluation and Treatment: Rehabilitation  THERAPY DIAG:  Radiculopathy, lumbar region  ONSET DATE: about 2 months ago  SUBJECTIVE:  SUBJECTIVE STATEMENT: Patient reports that her shoulders and knees were bothering some while doing her HEP.   PERTINENT HISTORY:  Hypertension, anxiety, depression, fibromyalgia, and vertigo  PAIN:  Are you having pain? Yes: NPRS scale: 5/10 Pain location: low back and left hip Pain description: intermittent, throbbing Aggravating factors: prolonged sitting, left side lying, and transfers Relieving factors: pain reduces with movement and medication  PRECAUTIONS: None  WEIGHT BEARING RESTRICTIONS: No  FALLS:  Has patient fallen in last 6 months? 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  PLOF: Independent  PATIENT GOALS: reduced pain, improved sleep (would sleep on her left side prior to her pain, but unable currently) and ease with transfers  NEXT MD VISIT: none scheduled  OBJECTIVE: all objective measures were assessed at her initial evaluation on 08/11/22 unless otherwise noted   PATIENT SURVEYS:  FOTO 67.04  SCREENING FOR RED FLAGS: Bowel or bladder incontinence: No Spinal tumors: No Cauda equina syndrome: No Compression fracture: No Abdominal aneurysm: No  COGNITION: Overall cognitive status: Within  functional limits for tasks assessed     SENSATION: Patient reports no numbness or tingling.   MUSCLE LENGTH: Hamstrings: No deficits bilaterally  POSTURE: forward head  PALPATION: TTP: left piriformis  LUMBAR JOINT MOBILITY:  L3-5; mild hypomobility and "sore"   LUMBAR ROM:   AROM eval  Flexion 68  Extension 18; familiar low back and hip pain   Right lateral flexion 25% limited; slight pain  Left lateral flexion 25% limited; increased pain  Right rotation WFL   Left rotation WFL; slight left hip pain   (Blank rows = not tested)  LOWER EXTREMITY ROM: WFL for activities assessed  LOWER EXTREMITY MMT:    MMT Right eval Left eval  Hip flexion 4+/5 4/5; burning  Hip extension    Hip abduction    Hip adduction    Hip internal rotation    Hip external rotation    Knee flexion 4/5 4-/5; familiar pain  Knee extension 4+/5 5/5  Ankle dorsiflexion 4/5 4/5  Ankle plantarflexion    Ankle inversion    Ankle eversion     (Blank rows = not tested)  LUMBAR SPECIAL TESTS:  Straight leg raise test: Negative and FABER test: Positive  GAIT: Assistive device utilized: None Level of assistance: Complete Independence Comments: no significant gait deviations  TODAY'S TREATMENT:                                                                                                                              DATE:                                     4/30 EXERCISE LOG  Exercise  Repetitions and Resistance Comments  Bridge 15 reps     Lower trunk rotations  10 reps each    Piriformis stretch  2 minutes   Double knee to chest  2 minutes  With LE supported on red ball   LTR w/ LE supported on green ball 2 minutes   SLR  25 reps each     Blank cell = exercise not performed today  Manual Therapy Manual Traction: LLE long axis distraction, for reduced pain                                     4/16 EXERCISE LOG  Exercise Repetitions and Resistance Comments  Isometric ball press  20  reps w/ 5 second hold    Ball roll out  3 minutes    Wood chops  Blue XTS x 2 minutes each    Seated HS stretch  3 x 30 seconds  LLE   Multifidus press out with rotation  Green t-band x 2 minutes each    Wall squat  3 minutes    Blank cell = exercise not performed today                                    4/10 EXERCISE LOG  Exercise Repetitions and Resistance Comments  Bridge  10 reps   Piriformis stretch  3 x 30 seconds each   Lower trunk rotation 10 reps each            Blank cell = exercise not performed today   PATIENT EDUCATION:  Education details: Plan of care, healing, prognosis, HEP, and goals for therapy Person educated: Patient Education method: Explanation Education comprehension: verbalized understanding  HOME EXERCISE PROGRAM: [id number] and [id number] (08/17/22)  ASSESSMENT:  CLINICAL IMPRESSION: Patient was introduced to multiple new supine interventions for reduced lumbar pain and improved mobility. She required minimal cueing with supine double knee to chest for proper lower extremity positioning to promote improved lumbar mobility. Manual therapy focused on left lower extremity long axis distraction for reduced pain with good results. She was educated on how to properly perform this intervention at home. She reported that her back felt good upon the conclusion of treatment. She continues to require skilled physical therapy to address her remaining impairments to return to her prior level of function.   OBJECTIVE IMPAIRMENTS: decreased activity tolerance, decreased mobility, decreased ROM, decreased strength, hypomobility, impaired tone, and pain.   ACTIVITY LIMITATIONS: sitting, sleeping, and transfers  PARTICIPATION LIMITATIONS: occupation  PERSONAL FACTORS: 3+ comorbidities: Hypertension, anxiety, depression, fibromyalgia, and vertigo  are also affecting patient's functional outcome.   REHAB POTENTIAL: Good  CLINICAL DECISION MAKING: Evolving/moderate  complexity  EVALUATION COMPLEXITY: Moderate   GOALS: Goals reviewed with patient? Yes  LONG TERM GOALS: Target date: 09/08/22  Patient will be independent with her HEP. Baseline:  Goal status: INITIAL  2.  Patient will be able to complete her daily activities without her familiar pain exceeding 5/10. Baseline:  Goal status: INITIAL  3.  Patient will report being able to sleep throughout the night without being awakened by her familiar symptoms. Baseline:  Goal status: INITIAL  4.  Patient will improve her left knee flexor strength to at least 4/5 for improved ease with her daily activities. Baseline:  Goal status: INITIAL  PLAN:  PT FREQUENCY: 2x/week  PT DURATION: 4 weeks  PLANNED INTERVENTIONS: Therapeutic exercises, Therapeutic activity, Neuromuscular re-education, Balance training, Patient/Family education, Self Care, Joint mobilization, Joint manipulation, Stair training, Dry Needling, Electrical stimulation, Spinal manipulation, Spinal mobilization, Cryotherapy, Moist heat, Traction, Manual therapy, and Re-evaluation.  PLAN FOR NEXT SESSION: NuStep/recumbent bike, lumbar stabilization, lumbar and lower extremity strengthening, lumbar joint mobilizations, and modalities as needed   Granville Lewis, PT 08/31/2022, 1:38 PM

## 2022-09-03 ENCOUNTER — Ambulatory Visit: Payer: Federal, State, Local not specified - PPO | Attending: Student

## 2022-09-03 DIAGNOSIS — M5416 Radiculopathy, lumbar region: Secondary | ICD-10-CM | POA: Insufficient documentation

## 2022-09-03 NOTE — Therapy (Signed)
OUTPATIENT PHYSICAL THERAPY THORACOLUMBAR TREATMENT   Patient Name: Rita Barry MRN: [id number] DOB:10/20/1965, 57 y.o., female Today's Date: 09/03/2022  END OF SESSION:  PT End of Session - 09/03/22 1130     Visit Number 4    Number of Visits 8    Date for PT Re-Evaluation 09/10/22    PT Start Time 1115    PT Stop Time 1158    PT Time Calculation (min) 43 min    Activity Tolerance Patient tolerated treatment well    Behavior During Therapy [facility] for tasks assessed/performed              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   EGD FEB 2018 35 CM 22 HPF, 20 CM 23 HPF   Fibromyalgia    Questionable   Past Surgical History:  Procedure Laterality Date   COLONOSCOPY N/A 09/01/2015   Procedure: COLONOSCOPY;  Surgeon: West Bali, MD;  Location: [facility];  Service: Endoscopy;  Laterality: N/A;  9:45 AM   ESOPHAGOGASTRODUODENOSCOPY N/A 06/04/2016   Procedure: ESOPHAGOGASTRODUODENOSCOPY (EGD);  Surgeon: West Bali, MD;  Location: [facility];  Service: Endoscopy;  Laterality: N/A;  10:30 am   FINGER SURGERY     SAVORY DILATION N/A 06/04/2016   Procedure: SAVORY DILATION;  Surgeon: West Bali, MD;  Location: [facility];  Service: Endoscopy;  Laterality: N/A;   SHOULDER SURGERY     Left   SHOULDER SURGERY Left    x2 surgeries   Patient Active Problem List   Diagnosis Date Noted   Class 1 obesity due to excess calories with serious comorbidity and body mass index (BMI) of 30.0 to 30.9 in adult 03/19/2022   Trigger finger, right middle finger 10/18/2019   Esophagitis, 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   Erosive gastritis    Esophageal dysphagia 02/21/2016   Essential hypertension 11/26/2015   Allergic rhinitis 08/25/2015   Vitamin D deficiency 08/23/2014   Hyperlipidemia 08/23/2014   GAD (generalized anxiety disorder) 09/18/2013   Benign paroxysmal  positional vertigo 03/02/2013   Depression 08/08/2012   Fibromyalgia 08/08/2012    PCP: Junie Spencer, FNP  REFERRING PROVIDER: Derenda Fennel, PA   REFERRING DIAG: Low back pain, unspecified   Rationale for Evaluation and Treatment: Rehabilitation  THERAPY DIAG:  Radiculopathy, lumbar region  ONSET DATE: about 2 months ago  SUBJECTIVE:  SUBJECTIVE STATEMENT: Patient reports that she feels good today. She notes that the traction at her last appointment really helped.   PERTINENT HISTORY:  Hypertension, anxiety, depression, fibromyalgia, and vertigo  PAIN:  Are you having pain? Yes: NPRS scale: 2/10 Pain location: low back and left hip Pain description: intermittent, throbbing Aggravating factors: prolonged sitting, left side lying, and transfers Relieving factors: pain reduces with movement and medication  PRECAUTIONS: None  WEIGHT BEARING RESTRICTIONS: No  FALLS:  Has patient fallen in last 6 months? 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  PLOF: Independent  PATIENT GOALS: reduced pain, improved sleep (would sleep on her left side prior to her pain, but unable currently) and ease with transfers  NEXT MD VISIT: none scheduled  OBJECTIVE: all objective measures were assessed at her initial evaluation on 08/11/22 unless otherwise noted   PATIENT SURVEYS:  FOTO 67.04  SCREENING FOR RED FLAGS: Bowel or bladder incontinence: No Spinal tumors: No Cauda equina syndrome: No Compression fracture: No Abdominal aneurysm: No  COGNITION: Overall cognitive  status: Within functional limits for tasks assessed     SENSATION: Patient reports no numbness or tingling.   MUSCLE LENGTH: Hamstrings: No deficits bilaterally  POSTURE: forward head  PALPATION: TTP: left piriformis  LUMBAR JOINT MOBILITY:  L3-5; mild hypomobility and "sore"   LUMBAR ROM:   AROM eval  Flexion 68  Extension 18; familiar low back and hip pain   Right lateral flexion 25% limited; slight pain  Left lateral flexion 25% limited; increased pain  Right rotation WFL   Left rotation WFL; slight left hip pain   (Blank rows = not tested)  LOWER EXTREMITY ROM: WFL for activities assessed  LOWER EXTREMITY MMT:    MMT Right eval Left eval  Hip flexion 4+/5 4/5; burning  Hip extension    Hip abduction    Hip adduction    Hip internal rotation    Hip external rotation    Knee flexion 4/5 4-/5; familiar pain  Knee extension 4+/5 5/5  Ankle dorsiflexion 4/5 4/5  Ankle plantarflexion    Ankle inversion    Ankle eversion     (Blank rows = not tested)  LUMBAR SPECIAL TESTS:  Straight leg raise test: Negative and FABER test: Positive  GAIT: Assistive device utilized: None Level of assistance: Complete Independence Comments: no significant gait deviations  TODAY'S TREATMENT:                                                                                                                              DATE:                                     5/3  EXERCISE LOG  Exercise Repetitions and Resistance Comments  Nustep  L3-5 x 15 minutes   Wall squat  30 reps    Lumbar extension 30 reps   Open book 10 reps each    Standing open book  10 reps each    Self lower extremity traction With 5 pound ankle weight    Blank cell = exercise not performed today  Manual Therapy Joint Mobilizations: side lying lumbar, grade V  Manual Traction: LLE long axis distraction, for reduced pain                                     4/30 EXERCISE LOG  Exercise Repetitions and  Resistance Comments  Bridge 15 reps     Lower trunk rotations  10 reps each    Piriformis stretch  2 minutes   Double knee to chest  2 minutes  With LE supported on red ball   LTR w/ LE supported on green ball 2 minutes   SLR  25 reps each     Blank cell = exercise not performed today  Manual Therapy Manual Traction: LLE long axis distraction, for reduced pain                                     4/16 EXERCISE LOG  Exercise Repetitions and Resistance Comments  Isometric ball press  20 reps w/ 5 second hold    Ball roll out  3 minutes    Wood chops  Blue XTS x 2 minutes each    Seated HS stretch  3 x 30 seconds  LLE   Multifidus press out with rotation  Green t-band x 2 minutes each    Wall squat  3 minutes    Blank cell = exercise not performed today   PATIENT EDUCATION:  Education details: Plan of care, healing, prognosis, HEP, and goals for therapy Person educated: Patient Education method: Explanation Education comprehension: verbalized understanding  HOME EXERCISE PROGRAM: [id number] and [id number] (08/17/22)  ASSESSMENT:  CLINICAL IMPRESSION: Patient was introduced to multiple new interventions for improved lumbar mobility. She required minimal cueing with open books for proper exercise performance to facilitate lumbar mobility. Manual therapy focused on lumbar joint mobilizations and left lower extremity long axis distraction as these were able to significantly reduce her familiar symptoms. She was educated on how to progress her home exercise program to address her remaining symptoms. She reported feeling better upon the conclusion of treatment. She continues to require skilled physical therapy to address her remaining impairments to return to her prior level of function.  OBJECTIVE IMPAIRMENTS: decreased activity tolerance, decreased mobility, decreased ROM, decreased strength, hypomobility, impaired tone, and pain.   ACTIVITY LIMITATIONS: sitting, sleeping, and  transfers  PARTICIPATION LIMITATIONS: occupation  PERSONAL FACTORS: 3+ comorbidities: Hypertension, anxiety, depression, fibromyalgia, and vertigo  are also affecting patient's functional outcome.   REHAB POTENTIAL: Good  CLINICAL DECISION MAKING: Evolving/moderate complexity  EVALUATION COMPLEXITY: Moderate   GOALS: Goals reviewed with patient? Yes  LONG TERM GOALS: Target date: 09/08/22  Patient will be independent with her HEP. Baseline:  Goal status: INITIAL  2.  Patient will be able to complete her daily activities without her familiar pain exceeding 5/10. Baseline:  Goal status: INITIAL  3.  Patient will report being able to sleep throughout the night without being  awakened by her familiar symptoms. Baseline:  Goal status: INITIAL  4.  Patient will improve her left knee flexor strength to at least 4/5 for improved ease with her daily activities. Baseline:  Goal status: INITIAL  PLAN:  PT FREQUENCY: 2x/week  PT DURATION: 4 weeks  PLANNED INTERVENTIONS: Therapeutic exercises, Therapeutic activity, Neuromuscular re-education, Balance training, Patient/Family education, Self Care, Joint mobilization, Joint manipulation, Stair training, Dry Needling, Electrical stimulation, Spinal manipulation, Spinal mobilization, Cryotherapy, Moist heat, Traction, Manual therapy, and Re-evaluation.  PLAN FOR NEXT SESSION: NuStep/recumbent bike, lumbar stabilization, lumbar and lower extremity strengthening, lumbar joint mobilizations, and modalities as needed   Granville Lewis, PT 09/03/2022, 12:50 PM

## 2022-09-05 ENCOUNTER — Other Ambulatory Visit: Payer: Self-pay | Admitting: Family

## 2022-09-05 DIAGNOSIS — I10 Essential (primary) hypertension: Secondary | ICD-10-CM

## 2022-09-08 ENCOUNTER — Ambulatory Visit: Payer: Federal, State, Local not specified - PPO

## 2022-09-16 ENCOUNTER — Other Ambulatory Visit: Payer: Self-pay | Admitting: Family Medicine

## 2022-09-16 DIAGNOSIS — E6609 Other obesity due to excess calories: Secondary | ICD-10-CM

## 2022-09-16 MED ORDER — SEMAGLUTIDE-WEIGHT MANAGEMENT 2.4 MG/0.75ML ~~LOC~~ SOAJ: 2.4000 mg | SUBCUTANEOUS | 3 refills | Status: DC

## 2022-09-21 ENCOUNTER — Telehealth: Payer: Self-pay

## 2022-09-21 NOTE — Telephone Encounter (Signed)
Rita Barry ([name]) ZOXWRU 1.7MG /0.75ML auto-injectors Form [employer] Electronic PA Form (2017 NCPDP) Created 12 days ago Sent to Plan 4 minutes ago Plan Response 4 minutes ago Submit Clinical Questions 1 minute ago Determination Favorable 1 minute ago Your prior authorization for Reginal Lutes has been approved! MORE INFO Personalized support and financial assistance may be available through the [employer] program. For more information, and to see program requirements, click on the More Info button to the right.  Message from plan: Your PA request has been approved. Additional information will be provided in the approval communication. (Message 1145). Authorization Expiration Date: March 20, 2023.  Pharmacy informed

## 2022-09-24 ENCOUNTER — Encounter: Payer: Self-pay | Admitting: Family

## 2022-09-24 ENCOUNTER — Ambulatory Visit: Payer: Federal, State, Local not specified - PPO | Admitting: Family

## 2022-09-24 VITALS — BP 118/73 | HR 88 | Temp 97.4°F | Ht 67.5 in | Wt 180.0 lb

## 2022-09-24 DIAGNOSIS — I10 Essential (primary) hypertension: Secondary | ICD-10-CM

## 2022-09-24 DIAGNOSIS — F331 Major depressive disorder, recurrent, moderate: Secondary | ICD-10-CM | POA: Diagnosis not present

## 2022-09-24 DIAGNOSIS — E6609 Other obesity due to excess calories: Secondary | ICD-10-CM

## 2022-09-24 DIAGNOSIS — M797 Fibromyalgia: Secondary | ICD-10-CM

## 2022-09-24 DIAGNOSIS — E559 Vitamin D deficiency, unspecified: Secondary | ICD-10-CM | POA: Diagnosis not present

## 2022-09-24 DIAGNOSIS — Z6827 Body mass index (BMI) 27.0-27.9, adult: Secondary | ICD-10-CM

## 2022-09-24 DIAGNOSIS — E66811 Obesity, class 1: Secondary | ICD-10-CM

## 2022-09-24 DIAGNOSIS — F411 Generalized anxiety disorder: Secondary | ICD-10-CM

## 2022-09-24 DIAGNOSIS — R9230 Dense breasts, unspecified: Secondary | ICD-10-CM

## 2022-09-24 DIAGNOSIS — Z803 Family history of malignant neoplasm of breast: Secondary | ICD-10-CM

## 2022-09-24 DIAGNOSIS — Z713 Dietary counseling and surveillance: Secondary | ICD-10-CM

## 2022-09-24 DIAGNOSIS — J301 Allergic rhinitis due to pollen: Secondary | ICD-10-CM | POA: Diagnosis not present

## 2022-09-24 DIAGNOSIS — E785 Hyperlipidemia, unspecified: Secondary | ICD-10-CM

## 2022-09-24 MED ORDER — WEGOVY 1.7 MG/0.75ML ~~LOC~~ SOAJ
1.7000 mg | SUBCUTANEOUS | 1 refills | Status: DC
Start: 2022-09-24 — End: 2022-12-27

## 2022-09-24 MED ORDER — FLUTICASONE PROPIONATE 50 MCG/ACT NA SUSP: 2.0000 | Freq: Every day | NASAL | 2 refills | Status: DC

## 2022-09-24 NOTE — Progress Notes (Signed)
Subjective:    Patient ID: Rita Barry, female    DOB: 1966-01-10, 57 y.o.   MRN: [id number]  Chief Complaint  Patient presents with   Medication Consultation    Discuss mammo    PT presents to the office today for  chronic follow up. She has fibromyalgia that is stable. [place] and is very active and states she has generalized pain in the evenings of 8 out 10.    She reports she stopped her Ultram.   She is currently taking Ozempic 2.4  mg. She has lost 19 lbs.      09/24/2022   10:38 AM 07/02/2022    9:03 AM 03/19/2022   10:46 AM  Last 3 Weights  Weight (lbs) 180 lb 189 lb 199 lb 6.4 oz  Weight (kg) 81.647 kg 85.73 kg 90.447 kg     Hypertension This is a chronic problem. The current episode started more than 1 year ago. The problem has been resolved since onset. The problem is controlled. Associated symptoms include anxiety. Pertinent negatives include no malaise/fatigue, peripheral edema or shortness of breath. Risk factors for coronary artery disease include dyslipidemia. The current treatment provides moderate improvement.  Anxiety Presents for follow-up visit. Symptoms include excessive worry and nervous/anxious behavior. Patient reports no panic or shortness of breath. Symptoms occur rarely. The severity of symptoms is mild.    Hyperlipidemia This is a chronic problem. The current episode started more than 1 year ago. The problem is controlled. Recent lipid tests were reviewed and are normal. Pertinent negatives include no shortness of breath. Current antihyperlipidemic treatment includes diet change. The current treatment provides mild improvement of lipids. Risk factors for coronary artery disease include dyslipidemia, hypertension, a sedentary lifestyle and post-menopausal.  Depression        This is a chronic problem.  The current episode started more than 1 year ago.   The problem occurs intermittently.  Associated symptoms include no helplessness, no  hopelessness and not sad.  Past treatments include SSRIs - Selective serotonin reuptake inhibitors.  Past medical history includes anxiety.       Review of Systems  Constitutional:  Negative for malaise/fatigue.  Respiratory:  Negative for shortness of breath.   Psychiatric/Behavioral:  Positive for depression. The patient is nervous/anxious.   All other systems reviewed and are negative.      Objective:   Physical Exam Vitals reviewed.  Constitutional:      General: She is not in acute distress.    Appearance: She is well-developed.  HENT:     Head: Normocephalic and atraumatic.     Right Ear: Tympanic membrane normal.     Left Ear: Tympanic membrane normal.  Eyes:     Pupils: Pupils are equal, round, and reactive to light.  Neck:     Thyroid: No thyromegaly.  Cardiovascular:     Rate and Rhythm: Normal rate and regular rhythm.     Heart sounds: Normal heart sounds. No murmur heard. Pulmonary:     Effort: Pulmonary effort is normal. No respiratory distress.     Breath sounds: Normal breath sounds. No wheezing.  Abdominal:     General: Bowel sounds are normal. There is no distension.     Palpations: Abdomen is soft.     Tenderness: There is no abdominal tenderness.  Musculoskeletal:        General: No tenderness. Normal range of motion.     Cervical back: Normal range of motion and neck  supple.  Skin:    General: Skin is warm and dry.  Neurological:     Mental Status: She is alert and oriented to person, place, and time.     Cranial Nerves: No cranial nerve deficit.     Deep Tendon Reflexes: Reflexes are normal and symmetric.  Psychiatric:        Behavior: Behavior normal.        Thought Content: Thought content normal.        Judgment: Judgment normal.      BP 118/73   Pulse 88   Temp (!) 97.4 F (36.3 C) (Temporal)   Ht 5' 7.5" (1.715 m)   Wt 180 lb (81.6 kg)   LMP 04/16/2016 (Approximate)   SpO2 98%   BMI 27.78 kg/m       Assessment & Plan:    Rita Barry comes in today with chief complaint of Medication Consultation (Discuss mammo )   Diagnosis and orders addressed:  1. Allergic rhinitis due to pollen, unspecified seasonality - fluticasone (FLONASE) 50 MCG/ACT nasal spray; Place 2 sprays into both nostrils daily.  Dispense: 15.8 mL; Refill: 2 - CMP14+EGFR  2. Moderate episode of recurrent major depressive disorder (HCC) - CMP14+EGFR  3. Vitamin D deficiency - CMP14+EGFR  4. Hyperlipidemia, unspecified hyperlipidemia type - CMP14+EGFR  5. GAD (generalized anxiety disorder) - CMP14+EGFR  6. Fibromyalgia - CMP14+EGFR  7. Essential hypertension  - CMP14+EGFR  8. Class 1 obesity due to excess calories with serious comorbidity and body mass index (BMI) of 30.0 to 30.9 in adult - CMP14+EGFR - Semaglutide-Weight Management (WEGOVY) 1.7 MG/0.75ML SOAJ; Inject 1.7 mg into the skin once a week.  Dispense: 3 mL; Refill: 1  9. Dense breast tissue - MR BREAST BILATERAL WO CONTRAST; Future - CMP14+EGFR  10. Family history of breast cancer - MR BREAST BILATERAL WO CONTRAST; Future - CMP14+EGFR  11. Weight loss counseling, encounter for Patient's BMI is >30 mg/m2.  Patient's current BMI is Body mass index is 27.78 kg/m.Marland Kitchen  Patient has lost and maintained a 5% body weight loss.  Patient is currently enrolled in a healthy eating plan along with encouraged exercise.   Patient does not have a personal or family history of medullary thyroid carcinoma (MTC) or Multiple Endocrine Neoplasia syndrome type 2 (MEN 2). - Semaglutide-Weight Management (WEGOVY) 1.7 MG/0.75ML SOAJ; Inject 1.7 mg into the skin once a week.  Dispense: 3 mL; Refill: 1   Labs pending Health Maintenance reviewed Diet and exercise encouraged  Follow up plan: 3 months    [name], FNP

## 2022-09-24 NOTE — Patient Instructions (Signed)

## 2022-09-25 LAB — CMP14+EGFR
ALT: 27 IU/L (ref 0–32)
AST: 26 IU/L (ref 0–40)
Albumin/Globulin Ratio: 1.9 (ref 1.2–2.2)
Albumin: 4.7 g/dL (ref 3.8–4.9)
Alkaline Phosphatase: 31 IU/L — ABNORMAL LOW (ref 44–121)
BUN/Creatinine Ratio: 17 (ref 9–23)
BUN: 13 mg/dL (ref 6–24)
Bilirubin Total: 0.5 mg/dL (ref 0.0–1.2)
CO2: 23 mmol/L (ref 20–29)
Calcium: 10 mg/dL (ref 8.7–10.2)
Chloride: 97 mmol/L (ref 96–106)
Creatinine, Ser: 0.76 mg/dL (ref 0.57–1.00)
Globulin, Total: 2.5 g/dL (ref 1.5–4.5)
Glucose: 69 mg/dL — ABNORMAL LOW (ref 70–99)
Potassium: 4.4 mmol/L (ref 3.5–5.2)
Sodium: 136 mmol/L (ref 134–144)
Total Protein: 7.2 g/dL (ref 6.0–8.5)
eGFR: 91 mL/min/{1.73_m2} (ref >59)

## 2022-09-29 ENCOUNTER — Other Ambulatory Visit: Payer: Self-pay

## 2022-09-29 DIAGNOSIS — Z803 Family history of malignant neoplasm of breast: Secondary | ICD-10-CM

## 2022-09-29 DIAGNOSIS — R9230 Dense breasts, unspecified: Secondary | ICD-10-CM

## 2022-10-05 ENCOUNTER — Other Ambulatory Visit (HOSPITAL_COMMUNITY): Payer: Federal, State, Local not specified - PPO

## 2022-10-06 ENCOUNTER — Ambulatory Visit (HOSPITAL_COMMUNITY)
Admission: RE | Admit: 2022-10-06 | Discharge: 2022-10-06 | Disposition: A | Discharge status: Home / Self Care | Payer: Federal, State, Local not specified - PPO | Source: Ambulatory Visit | Attending: Family | Admitting: Family

## 2022-10-06 DIAGNOSIS — Z803 Family history of malignant neoplasm of breast: Secondary | ICD-10-CM | POA: Diagnosis not present

## 2022-10-06 DIAGNOSIS — R9230 Dense breasts, unspecified: Secondary | ICD-10-CM

## 2022-10-06 DIAGNOSIS — Z1239 Encounter for other screening for malignant neoplasm of breast: Secondary | ICD-10-CM | POA: Diagnosis not present

## 2022-10-06 MED ORDER — GADOBUTROL 1 MMOL/ML IV SOLN
8.0000 mL | Freq: Once | INTRAVENOUS | Status: AC | PRN
  Administered 2022-10-06: 8 mL via INTRAVENOUS

## 2022-11-08 DIAGNOSIS — H0102B Squamous blepharitis left eye, upper and lower eyelids: Secondary | ICD-10-CM | POA: Diagnosis not present

## 2022-11-08 DIAGNOSIS — H40053 Ocular hypertension, bilateral: Secondary | ICD-10-CM | POA: Diagnosis not present

## 2022-11-08 DIAGNOSIS — H0102A Squamous blepharitis right eye, upper and lower eyelids: Secondary | ICD-10-CM | POA: Diagnosis not present

## 2022-11-27 ENCOUNTER — Other Ambulatory Visit: Payer: Self-pay | Admitting: Family

## 2022-11-27 DIAGNOSIS — F331 Major depressive disorder, recurrent, moderate: Secondary | ICD-10-CM

## 2022-11-27 DIAGNOSIS — F411 Generalized anxiety disorder: Secondary | ICD-10-CM

## 2022-12-05 ENCOUNTER — Other Ambulatory Visit: Payer: Self-pay | Admitting: Family

## 2022-12-05 DIAGNOSIS — I10 Essential (primary) hypertension: Secondary | ICD-10-CM

## 2022-12-08 ENCOUNTER — Other Ambulatory Visit: Payer: Self-pay | Admitting: Family

## 2022-12-08 DIAGNOSIS — I10 Essential (primary) hypertension: Secondary | ICD-10-CM

## 2022-12-27 ENCOUNTER — Encounter: Payer: Self-pay | Admitting: Family

## 2022-12-27 ENCOUNTER — Ambulatory Visit: Payer: Federal, State, Local not specified - PPO | Admitting: Family

## 2022-12-27 VITALS — BP 124/75 | HR 74 | Temp 97.2°F | Ht 67.5 in | Wt 172.4 lb

## 2022-12-27 DIAGNOSIS — E559 Vitamin D deficiency, unspecified: Secondary | ICD-10-CM | POA: Diagnosis not present

## 2022-12-27 DIAGNOSIS — Z0000 Encounter for general adult medical examination without abnormal findings: Secondary | ICD-10-CM

## 2022-12-27 DIAGNOSIS — Z6830 Body mass index (BMI) 30.0-30.9, adult: Secondary | ICD-10-CM

## 2022-12-27 DIAGNOSIS — M797 Fibromyalgia: Secondary | ICD-10-CM

## 2022-12-27 DIAGNOSIS — Z7984 Long term (current) use of oral hypoglycemic drugs: Secondary | ICD-10-CM

## 2022-12-27 DIAGNOSIS — E663 Overweight: Secondary | ICD-10-CM | POA: Diagnosis not present

## 2022-12-27 DIAGNOSIS — E6609 Other obesity due to excess calories: Secondary | ICD-10-CM

## 2022-12-27 DIAGNOSIS — H8110 Benign paroxysmal vertigo, unspecified ear: Secondary | ICD-10-CM | POA: Diagnosis not present

## 2022-12-27 DIAGNOSIS — I10 Essential (primary) hypertension: Secondary | ICD-10-CM

## 2022-12-27 DIAGNOSIS — F411 Generalized anxiety disorder: Secondary | ICD-10-CM

## 2022-12-27 DIAGNOSIS — E785 Hyperlipidemia, unspecified: Secondary | ICD-10-CM | POA: Diagnosis not present

## 2022-12-27 DIAGNOSIS — F331 Major depressive disorder, recurrent, moderate: Secondary | ICD-10-CM

## 2022-12-27 DIAGNOSIS — Z0001 Encounter for general adult medical examination with abnormal findings: Secondary | ICD-10-CM | POA: Diagnosis not present

## 2022-12-27 MED ORDER — MECLIZINE HCL 25 MG PO CHEW
25.0000 mg | CHEWABLE_TABLET | Freq: Three times a day (TID) | ORAL | 1 refills | Status: DC
Start: 2022-12-27 — End: 2024-03-02

## 2022-12-27 MED ORDER — DIAZEPAM 2 MG PO TABS
2.0000 mg | ORAL_TABLET | Freq: Two times a day (BID) | ORAL | 0 refills | Status: DC | PRN
Start: 2022-12-27 — End: 2024-03-02

## 2022-12-27 MED ORDER — SEMAGLUTIDE-WEIGHT MANAGEMENT 2.4 MG/0.75ML ~~LOC~~ SOAJ
2.4000 mg | SUBCUTANEOUS | 1 refills | Status: AC
Start: 2023-01-10 — End: 2023-02-07

## 2022-12-27 NOTE — Progress Notes (Signed)
Subjective:    Patient ID: Rita Barry, female    DOB: 1965/05/27, 57 y.o.   MRN: [id number]  Chief Complaint  Patient presents with   Medical Management of Chronic Issues   Dizziness   PT presents to the office today for  CPE and chronic follow up. She has fibromyalgia that is stable. [place] and is very active and states she has generalized pain in the evenings of 8 out 10.    She reports she stopped her Ultram. She is complaining of vertigo. Reports she has had this on and off for years and use to see ENT, however, he has retired. She reports over the last month she has had dizziness with bending over and looking up. She rides horses daily and this has interfered with this.   Requesting her iron levels be checked today because last time she gave blood she was told she was borderline on her iron.    She is currently taking Ozempic 2.4  mg. She has lost 27 lbs.      12/27/2022   10:59 AM 09/24/2022   10:38 AM 07/02/2022    9:03 AM  Last 3 Weights  Weight (lbs) 172 lb 6.4 oz 180 lb 189 lb  Weight (kg) 78.2 kg 81.647 kg 85.73 kg     Dizziness The current episode started more than 1 year ago. The problem occurs intermittently. Associated symptoms include fatigue. The symptoms are aggravated by bending. She has tried rest and relaxation for the symptoms. The treatment provided mild relief.  Hypertension This is a chronic problem. The current episode started more than 1 year ago. The problem has been resolved since onset. The problem is controlled. Associated symptoms include anxiety. Pertinent negatives include no peripheral edema or shortness of breath. Risk factors for coronary artery disease include dyslipidemia. The current treatment provides moderate improvement.  Anxiety Presents for follow-up visit. Symptoms include dizziness, excessive worry and nervous/anxious behavior. Patient reports no shortness of breath. Symptoms occur occasionally. The severity of symptoms  is moderate.    Hyperlipidemia This is a chronic problem. The current episode started more than 1 year ago. The problem is controlled. Recent lipid tests were reviewed and are normal. Pertinent negatives include no shortness of breath. Current antihyperlipidemic treatment includes diet change. The current treatment provides moderate improvement of lipids. Risk factors for coronary artery disease include dyslipidemia, hypertension, a sedentary lifestyle and post-menopausal.  Depression        This is a chronic problem.  The current episode started more than 1 year ago.   The problem occurs intermittently.  Associated symptoms include fatigue and sad.  Associated symptoms include no helplessness and no hopelessness.  Past treatments include SSRIs - Selective serotonin reuptake inhibitors.  Past medical history includes anxiety.       Review of Systems  Constitutional:  Positive for fatigue.  Respiratory:  Negative for shortness of breath.   Neurological:  Positive for dizziness.  Psychiatric/Behavioral:  Positive for depression. The patient is nervous/anxious.   All other systems reviewed and are negative.  Family History  Problem Relation Age of Onset   Cancer Father        Throat   Hypertension Mother    Breast cancer Mother    Diabetes Maternal Grandmother    Social History   Socioeconomic History   Marital status: Married    Spouse name: Not on file   Number of children: Not on file   Years of  education: Not on file   Highest education level: Bachelor's degree (e.g., BA, AB, BS)  Occupational History   Occupation: Works with Horses  Tobacco Use   Smoking status: Never   Smokeless tobacco: Never  Vaping Use   Vaping status: Never Used  Substance and Sexual Activity   Alcohol use: Yes    Comment: 2-3 glassses of wine weekly   Drug use: No   Sexual activity: Yes  Other Topics Concern   Not on file  Social History Narrative   Not on file   Social Determinants of  Health   Financial Resource Strain: Low Risk  (12/23/2022)   Overall Financial Resource Strain (CARDIA)    Difficulty of Paying Living Expenses: Not hard at all  Food Insecurity: No Food Insecurity (12/23/2022)   Hunger Vital Sign    Worried About Running Out of Food in the Last Year: Never true    Ran Out of Food in the Last Year: Never true  Transportation Needs: No Transportation Needs (12/23/2022)   PRAPARE - Administrator, Civil Service (Medical): No    Lack of Transportation (Non-Medical): No  Physical Activity: Sufficiently Active (12/23/2022)   Exercise Vital Sign    Days of Exercise per Week: 5 days    Minutes of Exercise per Session: 60 min  Stress: No Stress Concern Present (12/23/2022)   [employer] of Occupational Health - Occupational Stress Questionnaire    Feeling of Stress : Not at all  Social Connections: Unknown (12/23/2022)   Social Connection and Isolation Panel [NHANES]    Frequency of Communication with Friends and Family: Twice a week    Frequency of Social Gatherings with Friends and Family: Once a week    Attends Religious Services: Patient declined    Database administrator or Organizations: Yes    Attends Engineer, structural: More than 4 times per year    Marital Status: Married       Objective:   Physical Exam Vitals reviewed.  Constitutional:      General: She is not in acute distress.    Appearance: She is well-developed.  HENT:     Head: Normocephalic and atraumatic.     Right Ear: Tympanic membrane normal.     Left Ear: Tympanic membrane normal.  Eyes:     Pupils: Pupils are equal, round, and reactive to light.  Neck:     Thyroid: No thyromegaly.  Cardiovascular:     Rate and Rhythm: Normal rate and regular rhythm.     Heart sounds: Normal heart sounds. No murmur heard. Pulmonary:     Effort: Pulmonary effort is normal. No respiratory distress.     Breath sounds: Normal breath sounds. No wheezing.  Abdominal:      General: Bowel sounds are normal. There is no distension.     Palpations: Abdomen is soft.     Tenderness: There is no abdominal tenderness.  Musculoskeletal:        General: No tenderness. Normal range of motion.     Cervical back: Normal range of motion and neck supple.  Skin:    General: Skin is warm and dry.  Neurological:     Mental Status: She is alert and oriented to person, place, and time.     Cranial Nerves: No cranial nerve deficit.     Deep Tendon Reflexes: Reflexes are normal and symmetric.  Psychiatric:        Behavior: Behavior normal.  Thought Content: Thought content normal.        Judgment: Judgment normal.       BP 124/75   Pulse 74   Temp (!) 97.2 F (36.2 C) (Temporal)   Ht 5' 7.5" (1.715 m)   Wt 172 lb 6.4 oz (78.2 kg)   LMP 04/16/2016 (Approximate)   SpO2 98%   BMI 26.60 kg/m      Assessment & Plan:   Rita Barry comes in today with chief complaint of Medical Management of Chronic Issues and Dizziness   Diagnosis and orders addressed:  1. Annual physical exam - CMP14+EGFR - Anemia Profile B - Lipid panel - TSH - VITAMIN D 25 Hydroxy (Vit-D Deficiency, Fractures)  2. Benign paroxysmal positional vertigo, unspecified laterality - CMP14+EGFR - diazepam (VALIUM) 2 MG tablet; Take 1 tablet (2 mg total) by mouth every 12 (twelve) hours as needed for anxiety.  Dispense: 30 tablet; Refill: 0 - Meclizine HCl (ANTIVERT) 25 MG CHEW; Chew 1 tablet (25 mg total) by mouth every 8 (eight) hours.  Dispense: 90 tablet; Refill: 1  3. Overweight (BMI 25.0-29.9) - CMP14+EGFR - Semaglutide-Weight Management 2.4 MG/0.75ML SOAJ; Inject 2.4 mg into the skin once a week for 28 days.  Dispense: 3 mL; Refill: 1  4. Vitamin D deficiency - CMP14+EGFR  5. Hyperlipidemia, unspecified hyperlipidemia type - CMP14+EGFR - Lipid panel - Semaglutide-Weight Management 2.4 MG/0.75ML SOAJ; Inject 2.4 mg into the skin once a week for 28 days.  Dispense: 3 mL;  Refill: 1  6. GAD (generalized anxiety disorder) - CMP14+EGFR  7. Fibromyalgia - CMP14+EGFR  8. Essential hypertension - CMP14+EGFR - Semaglutide-Weight Management 2.4 MG/0.75ML SOAJ; Inject 2.4 mg into the skin once a week for 28 days.  Dispense: 3 mL; Refill: 1  9. Moderate episode of recurrent major depressive disorder (HCC) - CMP14+EGFR  10. Class 1 obesity due to excess calories with serious comorbidity and body mass index (BMI) of 30.0 to 30.9 in adult  - Semaglutide-Weight Management 2.4 MG/0.75ML SOAJ; Inject 2.4 mg into the skin once a week for 28 days.  Dispense: 3 mL; Refill: 1   Labs pending epley exercises discussed PT reviewed in [place] controlled database, no red flags  Continue Wegovy 2.4 mg Will give Valium 2 mg and Antivert.  Health Maintenance reviewed Diet and exercise encouraged  Follow up plan: 4 months    [name], FNP

## 2022-12-27 NOTE — Patient Instructions (Signed)
Benign Positional Vertigo Vertigo is the feeling that you or your surroundings are moving when they are not. Benign positional vertigo is the most common form of vertigo. This is usually a harmless condition (benign). This condition is positional. This means that symptoms are triggered by certain movements and positions. This condition can be dangerous if it occurs while you are doing something that could cause harm to yourself or others. This includes activities such as driving or operating machinery. What are the causes? The inner ear has fluid-filled canals that help your brain sense movement and balance. When the fluid moves, the brain receives messages about your body's position. With benign positional vertigo, calcium crystals in the inner ear break free and disturb the inner ear area. This causes your brain to receive confusing messages about your body's position. What increases the risk? You are more likely to develop this condition if: You are a woman. You are 50 years of age or older. You have recently had a head injury. You have an inner ear disease. What are the signs or symptoms? Symptoms of this condition usually happen when you move your head or your eyes in different directions. Symptoms may start suddenly and usually last for less than a minute. They include: Loss of balance and falling. Feeling like you are spinning or moving. Feeling like your surroundings are spinning or moving. Nausea and vomiting. Blurred vision. Dizziness. Involuntary eye movement (nystagmus). Symptoms can be mild and cause only minor problems, or they can be severe and interfere with daily life. Episodes of benign positional vertigo may return (recur) over time. Symptoms may also improve over time. How is this diagnosed? This condition may be diagnosed based on: Your medical history. A physical exam of the head, neck, and ears. Positional tests to check for or stimulate vertigo. You may be asked to  turn your head and change positions, such as going from sitting to lying down. A health care provider will watch for symptoms of vertigo. You may be referred to a health care provider who specializes in ear, nose, and throat problems (ENT or otolaryngologist) or a provider who specializes in disorders of the nervous system (neurologist). How is this treated?  This condition may be treated in a session in which your health care provider moves your head in specific positions to help the displaced crystals in your inner ear move. Treatment for this condition may take several sessions. Surgery may be needed in severe cases, but this is rare. In some cases, benign positional vertigo may resolve on its own in 2-4 weeks. Follow these instructions at home: Safety Move slowly. Avoid sudden body or head movements or certain positions, as told by your health care provider. Avoid driving or operating machinery until your health care provider says it is safe. Avoid doing any tasks that would be dangerous to you or others if vertigo occurs. If you have trouble walking or keeping your balance, try using a cane for stability. If you feel dizzy or unstable, sit down right away. Return to your normal activities as told by your health care provider. Ask your health care provider what activities are safe for you. General instructions Take over-the-counter and prescription medicines only as told by your health care provider. Drink enough fluid to keep your urine pale yellow. Keep all follow-up visits. This is important. Contact a health care provider if: You have a fever. Your condition gets worse or you develop new symptoms. Your family or friends notice any behavioral changes.   You have nausea or vomiting that gets worse. You have numbness or a prickling and tingling sensation. Get help right away if you: Have difficulty speaking or moving. Are always dizzy or faint. Develop severe headaches. Have weakness in  your legs or arms. Have changes in your hearing or vision. Develop a stiff neck. Develop sensitivity to light. These symptoms may represent a serious problem that is an emergency. Do not wait to see if the symptoms will go away. Get medical help right away. Call your local emergency services (911 in the [place] Do not drive yourself to the hospital. Summary Vertigo is the feeling that you or your surroundings are moving when they are not. Benign positional vertigo is the most common form of vertigo. This condition is caused by calcium crystals in the inner ear that become displaced. This causes a disturbance in an area of the inner ear that helps your brain sense movement and balance. Symptoms include loss of balance and falling, feeling that you or your surroundings are moving, nausea and vomiting, and blurred vision. This condition can be diagnosed based on symptoms, a physical exam, and positional tests. Follow safety instructions as told by your health care provider and keep all follow-up visits. This is important. This information is not intended to replace advice given to you by your health care provider. Make sure you discuss any questions you have with your health care provider. Document Revised: 03/19/2020 Document Reviewed: 03/19/2020 Elsevier Patient Education  2024 [employer].

## 2022-12-28 LAB — LIPID PANEL
Chol/HDL Ratio: 3.6 ratio (ref 0.0–4.4)
Cholesterol, Total: 270 mg/dL — ABNORMAL HIGH (ref 100–199)
HDL: 76 mg/dL (ref >39)
LDL Chol Calc (NIH): 177 mg/dL — ABNORMAL HIGH (ref 0–99)
Triglycerides: 101 mg/dL (ref 0–149)
VLDL Cholesterol Cal: 17 mg/dL (ref 5–40)

## 2022-12-28 LAB — ANEMIA PROFILE B
Basophils Absolute: 0.1 10*3/uL (ref 0.0–0.2)
Basos: 1 %
EOS (ABSOLUTE): 1 10*3/uL — ABNORMAL HIGH (ref 0.0–0.4)
Eos: 18 %
Ferritin: 128 ng/mL (ref 15–150)
Folate: 12.2 ng/mL (ref >3.0)
Hematocrit: 42.1 % (ref 34.0–46.6)
Hemoglobin: 14.1 g/dL (ref 11.1–15.9)
Immature Grans (Abs): 0 10*3/uL (ref 0.0–0.1)
Immature Granulocytes: 0 %
Iron Saturation: 20 % (ref 15–55)
Iron: 64 ug/dL (ref 27–159)
Lymphocytes Absolute: 1.5 10*3/uL (ref 0.7–3.1)
Lymphs: 27 %
MCH: 33.6 pg — ABNORMAL HIGH (ref 26.6–33.0)
MCHC: 33.5 g/dL (ref 31.5–35.7)
MCV: 100 fL — ABNORMAL HIGH (ref 79–97)
Monocytes Absolute: 0.4 10*3/uL (ref 0.1–0.9)
Monocytes: 7 %
Neutrophils Absolute: 2.7 10*3/uL (ref 1.4–7.0)
Neutrophils: 47 %
Platelets: 358 10*3/uL (ref 150–450)
RBC: 4.2 x10E6/uL (ref 3.77–5.28)
RDW: 13.1 % (ref 11.7–15.4)
Retic Ct Pct: 2.3 % (ref 0.6–2.6)
Total Iron Binding Capacity: 327 ug/dL (ref 250–450)
UIBC: 263 ug/dL (ref 131–425)
Vitamin B-12: 1258 pg/mL — ABNORMAL HIGH (ref 232–1245)
WBC: 5.7 10*3/uL (ref 3.4–10.8)

## 2022-12-28 LAB — CMP14+EGFR
ALT: 37 IU/L — ABNORMAL HIGH (ref 0–32)
AST: 41 IU/L — ABNORMAL HIGH (ref 0–40)
Albumin: 4.4 g/dL (ref 3.8–4.9)
Alkaline Phosphatase: 31 IU/L — ABNORMAL LOW (ref 44–121)
BUN/Creatinine Ratio: 13 (ref 9–23)
BUN: 9 mg/dL (ref 6–24)
Bilirubin Total: 0.4 mg/dL (ref 0.0–1.2)
CO2: 23 mmol/L (ref 20–29)
Calcium: 9.8 mg/dL (ref 8.7–10.2)
Chloride: 101 mmol/L (ref 96–106)
Creatinine, Ser: 0.67 mg/dL (ref 0.57–1.00)
Globulin, Total: 2.4 g/dL (ref 1.5–4.5)
Glucose: 86 mg/dL (ref 70–99)
Potassium: 4.5 mmol/L (ref 3.5–5.2)
Sodium: 139 mmol/L (ref 134–144)
Total Protein: 6.8 g/dL (ref 6.0–8.5)
eGFR: 102 mL/min/{1.73_m2} (ref >59)

## 2022-12-28 LAB — TSH: TSH: 1.73 u[IU]/mL (ref 0.450–4.500)

## 2022-12-28 LAB — VITAMIN D 25 HYDROXY (VIT D DEFICIENCY, FRACTURES): Vit D, 25-Hydroxy: 48.2 ng/mL (ref 30.0–100.0)

## 2022-12-28 NOTE — Progress Notes (Signed)
Patient r/c  

## 2023-02-25 ENCOUNTER — Other Ambulatory Visit: Payer: Self-pay | Admitting: Family

## 2023-02-25 DIAGNOSIS — F331 Major depressive disorder, recurrent, moderate: Secondary | ICD-10-CM

## 2023-02-25 DIAGNOSIS — F411 Generalized anxiety disorder: Secondary | ICD-10-CM

## 2023-03-06 ENCOUNTER — Other Ambulatory Visit: Payer: Self-pay | Admitting: Family

## 2023-03-06 DIAGNOSIS — I10 Essential (primary) hypertension: Secondary | ICD-10-CM

## 2023-03-30 MED ORDER — WEGOVY 2.4 MG/0.75ML ~~LOC~~ SOAJ: 2.4000 mg | SUBCUTANEOUS | 0 refills | Status: DC

## 2023-04-04 ENCOUNTER — Telehealth: Payer: Self-pay

## 2023-04-04 NOTE — Telephone Encounter (Signed)
Charolett Bumpers (KeyAssunta Gambles) Rx #: [id number] XBJYNW 2.4MG /0.75ML auto-injectors Form [employer] Electronic PA Form [phone] NCPDP) Created 5 days ago Sent to Plan 6 minutes ago Plan Response 6 minutes ago Submit Clinical Questions 1 minute ago Determination Favorable less than a minute ago Your prior authorization for Reginal Lutes has been approved! More Info Personalized support and financial assistance may be available through the [employer] program. For more information, and to see program requirements, click on the More Info button to the right.  Message from plan: Your PA request has been approved. Additional information will be provided in the approval communication. (Message 1145). Authorization Expiration Date: April 03, 2024.

## 2023-04-28 ENCOUNTER — Telehealth: Payer: Federal, State, Local not specified - PPO | Admitting: Family

## 2023-04-28 ENCOUNTER — Encounter: Payer: Self-pay | Admitting: Family

## 2023-04-28 VITALS — Wt 145.0 lb

## 2023-04-28 DIAGNOSIS — F331 Major depressive disorder, recurrent, moderate: Secondary | ICD-10-CM

## 2023-04-28 DIAGNOSIS — M797 Fibromyalgia: Secondary | ICD-10-CM

## 2023-04-28 DIAGNOSIS — F411 Generalized anxiety disorder: Secondary | ICD-10-CM | POA: Diagnosis not present

## 2023-04-28 DIAGNOSIS — E785 Hyperlipidemia, unspecified: Secondary | ICD-10-CM

## 2023-04-28 DIAGNOSIS — Z713 Dietary counseling and surveillance: Secondary | ICD-10-CM

## 2023-04-28 DIAGNOSIS — I10 Essential (primary) hypertension: Secondary | ICD-10-CM

## 2023-04-28 DIAGNOSIS — R053 Chronic cough: Secondary | ICD-10-CM

## 2023-04-28 DIAGNOSIS — H8110 Benign paroxysmal vertigo, unspecified ear: Secondary | ICD-10-CM

## 2023-04-28 DIAGNOSIS — E663 Overweight: Secondary | ICD-10-CM

## 2023-04-28 MED ORDER — ESCITALOPRAM OXALATE 20 MG PO TABS
20.0000 mg | ORAL_TABLET | Freq: Every day | ORAL | 0 refills | Status: DC
Start: 2023-04-28 — End: 2023-08-17

## 2023-04-28 MED ORDER — WEGOVY 2.4 MG/0.75ML ~~LOC~~ SOAJ
2.4000 mg | SUBCUTANEOUS | 0 refills | Status: DC
Start: 2023-04-28 — End: 2023-05-30

## 2023-04-28 MED ORDER — ALBUTEROL SULFATE HFA 108 (90 BASE) MCG/ACT IN AERS
2.0000 | INHALATION_SPRAY | Freq: Four times a day (QID) | RESPIRATORY_TRACT | 0 refills | Status: AC | PRN
Start: 2023-04-28 — End: ?

## 2023-04-28 NOTE — Progress Notes (Signed)
Virtual Visit Consent   Rita Barry, you are scheduled for a virtual visit with a [facility] provider today. Just as with appointments in the office, your consent must be obtained to participate. Your consent will be active for this visit and any virtual visit you may have with one of our providers in the next 365 days. If you have a MyChart account, a copy of this consent can be sent to you electronically.  As this is a virtual visit, video technology does not allow for your provider to perform a traditional examination. This may limit your provider's ability to fully assess your condition. If your provider identifies any concerns that need to be evaluated in person or the need to arrange testing (such as labs, EKG, etc.), we will make arrangements to do so. Although advances in technology are sophisticated, we cannot ensure that it will always work on either your end or our end. If the connection with a video visit is poor, the visit may have to be switched to a telephone visit. With either a video or telephone visit, we are not always able to ensure that we have a secure connection.  By engaging in this virtual visit, you consent to the provision of healthcare and authorize for your insurance to be billed (if applicable) for the services provided during this visit. Depending on your insurance coverage, you may receive a charge related to this service.  I need to obtain your verbal consent now. Are you willing to proceed with your visit today? Rita Barry has provided verbal consent on 04/28/2023 for a virtual visit (video or telephone). [name], FNP  Date: 04/28/2023 10:58 AM  Virtual Visit via Video Note   I, [name], connected with  Rita Barry  ([id number], 1965/05/21) on 04/28/23 at 10:55 AM EST by a video-enabled telemedicine application and verified that I am speaking with the correct person using two identifiers.  Location: Patient: Virtual Visit Location Patient:  Home Provider: Virtual Visit Location Provider: Home Office   I discussed the limitations of evaluation and management by telemedicine and the availability of in person appointments. The patient expressed understanding and agreed to proceed.    History of Present Illness: Rita Barry is a 57 y.o. who identifies as a female who was assigned female at birth, and is being seen today for chronic follow up. She has fibromyalgia that is stable. [place] and is very active and states she has generalized pain in the evenings of 5 out 10.    She reports she stopped her Ultram. She is complaining of vertigo. Reports she has had this on and off for years and use to see ENT, however, he has retired. She reports over the last month she has had dizziness with bending over and looking up. She rides horses daily and this has interfered with this.    She is currently taking Ozempic 2.4  mg. She has lost 54 lbs.     04/28/2023   10:48 AM 12/27/2022   10:59 AM 09/24/2022   10:38 AM  Last 3 Weights  Weight (lbs) 145 lb 172 lb 6.4 oz 180 lb  Weight (kg) 65.772 kg 78.2 kg 81.647 kg     HPI: Hypertension This is a chronic problem. The current episode started more than 1 year ago. The problem has been resolved since onset. The problem is controlled. Associated symptoms include anxiety. Pertinent negatives include no malaise/fatigue, peripheral edema or shortness of breath.  Risk factors for coronary artery disease include dyslipidemia and sedentary lifestyle. The current treatment provides moderate improvement.  Gastroesophageal Reflux She complains of belching and heartburn. This is a chronic problem. The current episode started more than 1 year ago. The problem occurs occasionally. The symptoms are aggravated by certain foods. She has tried an antacid for the symptoms. The treatment provided moderate relief.  Dizziness This is a chronic problem. The current episode started more than 1 year ago. The  problem occurs intermittently. The treatment provided mild relief.  Anxiety Presents for follow-up visit. Symptoms include dizziness and nervous/anxious behavior. Patient reports no shortness of breath. Symptoms occur rarely. The severity of symptoms is mild.    Hyperlipidemia This is a chronic problem. The current episode started more than 1 year ago. Pertinent negatives include no shortness of breath. Current antihyperlipidemic treatment includes diet change. The current treatment provides moderate improvement of lipids. Risk factors for coronary artery disease include dyslipidemia, hypertension, a sedentary lifestyle and post-menopausal.  Depression        This is a chronic problem.  The current episode started more than 1 year ago.   The problem occurs intermittently.  Associated symptoms include no helplessness and no hopelessness.  Past medical history includes anxiety.     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 Has patient had a PCN reaction causing immediate rash, facial/tongue/throat swelling, SOB or lightheadedness with hypotension: Yes Has patient had a PCN reaction causing severe rash involving mucus membranes or skin necrosis: No Has patient had a PCN reaction that required hospitalization: No Has patient had a PCN reaction occurring  within the last 10 years: No If all of the above answers are "NO", then may proceed with Cephalos   Medications:  Current Outpatient Medications:    albuterol (VENTOLIN HFA) 108 (90 Base) MCG/ACT inhaler, Inhale 2 puffs into the lungs every 6 (six) hours as needed for wheezing or shortness of breath., Disp: 8 g, Rfl: 0   diazepam (VALIUM) 2 MG tablet, Take 1 tablet (2 mg total) by mouth every 12 (twelve) hours as needed for anxiety., Disp: 30 tablet, Rfl: 0   escitalopram (LEXAPRO) 20 MG tablet, Take 1 tablet (20 mg total) by mouth daily., Disp: 90 tablet, Rfl: 0   hydrochlorothiazide (MICROZIDE) 12.5 MG capsule, TAKE 1 CAPSULE BY MOUTH EVERY DAY, Disp: 90 capsule, Rfl: 0   Meclizine HCl (ANTIVERT) 25 MG CHEW, Chew 1 tablet (25 mg total) by mouth every 8 (eight) hours., Disp: 90 tablet, Rfl: 1   Multiple Vitamins-Minerals (WOMENS MULTI VITAMIN & MINERAL PO), Take 1 tablet by mouth daily., Disp: , Rfl:    Semaglutide-Weight Management (WEGOVY) 2.4 MG/0.75ML SOAJ, Inject 2.4 mg into the skin once a week., Disp: 3 mL, Rfl: 0   Vitamin D, Cholecalciferol, 1000 units CAPS, Take 1,000 Units by mouth daily. , Disp: , Rfl:  Observations/Objective: Patient is well-developed, well-nourished in no acute distress.  Resting comfortably  at home.  Head is normocephalic, atraumatic.  No labored breathing.  Speech is clear and coherent with logical content.  Patient is alert and oriented at baseline.    Assessment and Plan: 1. Moderate episode of recurrent major depressive disorder (HCC) - escitalopram (LEXAPRO) 20 MG tablet; Take 1 tablet (20 mg total) by mouth daily.  Dispense: 90 tablet; Refill: 0  2. GAD (generalized anxiety disorder) - escitalopram (LEXAPRO) 20 MG tablet; Take 1 tablet (20 mg total) by mouth daily.  Dispense: 90 tablet; Refill: 0  3. Fibromyalgia (Primary)  4. Benign paroxysmal positional vertigo, unspecified laterality  5. Hyperlipidemia, unspecified hyperlipidemia  type  6. Essential hypertension  7. Overweight (BMI 25.0-29.9) - Semaglutide-Weight Management (WEGOVY) 2.4 MG/0.75ML SOAJ; Inject 2.4 mg into the skin once a week.  Dispense: 3 mL; Refill: 0  8. Weight loss counseling, encounter for - Semaglutide-Weight Management (WEGOVY) 2.4 MG/0.75ML SOAJ; Inject 2.4 mg into the skin once a week.  Dispense: 3 mL; Refill: 0  9. Chronic cough - albuterol (VENTOLIN HFA) 108 (90 Base) MCG/ACT inhaler; Inhale 2 puffs into the lungs every 6 (six) hours as needed for wheezing or shortness of breath.  Dispense: 8 g; Refill: 0  Continue current medications  Continue low fat diet and exercise  Albuterol as needed  Follow up in 3 months   Follow Up Instructions: I discussed the assessment and treatment plan with the patient. The patient was provided an opportunity to ask questions and all were answered. The patient agreed with the plan and demonstrated an understanding of the instructions.  A copy of instructions were sent to the patient via MyChart unless otherwise noted below.    The patient was advised to call back or seek an in-person evaluation if the symptoms worsen or if the condition fails to improve as anticipated.    [name], FNP

## 2023-05-28 ENCOUNTER — Other Ambulatory Visit: Payer: Self-pay | Admitting: Family

## 2023-05-28 DIAGNOSIS — E663 Overweight: Secondary | ICD-10-CM

## 2023-05-28 DIAGNOSIS — Z713 Dietary counseling and surveillance: Secondary | ICD-10-CM

## 2023-05-31 NOTE — Telephone Encounter (Signed)
Copied from CRM [id number]. Topic: Clinical - Prescription Issue >> May 31, 2023  4:41 PM Joanette Gula wrote: Semaglutide-Weight Management (WEGOVY) 2.4 MG/0.75ML SOAJ is too high and the patient would like to know if Orlistat 30 day... Contrave.. or Xenical to help.

## 2023-06-05 ENCOUNTER — Other Ambulatory Visit: Payer: Self-pay | Admitting: Family

## 2023-06-05 DIAGNOSIS — I10 Essential (primary) hypertension: Secondary | ICD-10-CM

## 2023-07-28 ENCOUNTER — Ambulatory Visit: Admitting: Family

## 2023-07-28 ENCOUNTER — Encounter: Payer: Self-pay | Admitting: Family

## 2023-07-28 VITALS — BP 131/74 | HR 74 | Temp 97.5°F | Ht 67.5 in | Wt 161.0 lb

## 2023-07-28 DIAGNOSIS — Z0001 Encounter for general adult medical examination with abnormal findings: Secondary | ICD-10-CM | POA: Diagnosis not present

## 2023-07-28 DIAGNOSIS — E559 Vitamin D deficiency, unspecified: Secondary | ICD-10-CM

## 2023-07-28 DIAGNOSIS — Z0000 Encounter for general adult medical examination without abnormal findings: Secondary | ICD-10-CM | POA: Diagnosis not present

## 2023-07-28 DIAGNOSIS — F411 Generalized anxiety disorder: Secondary | ICD-10-CM

## 2023-07-28 DIAGNOSIS — M797 Fibromyalgia: Secondary | ICD-10-CM | POA: Diagnosis not present

## 2023-07-28 DIAGNOSIS — I10 Essential (primary) hypertension: Secondary | ICD-10-CM | POA: Diagnosis not present

## 2023-07-28 DIAGNOSIS — H8110 Benign paroxysmal vertigo, unspecified ear: Secondary | ICD-10-CM | POA: Diagnosis not present

## 2023-07-28 DIAGNOSIS — E785 Hyperlipidemia, unspecified: Secondary | ICD-10-CM | POA: Diagnosis not present

## 2023-07-28 NOTE — Progress Notes (Signed)
 Subjective:    Patient ID: Rita Barry, female    DOB: 08-18-1965, 58 y.o.   MRN: [id number]  Chief Complaint  Patient presents with   Medical Management of Chronic Issues    Discuss wt   PT presents to the office today for  CPE and chronic follow up. She has fibromyalgia that is stable. [place] and is very active and states she has generalized pain in the evenings of 3 out 10.    She reports she stopped her Ultram. She is complaining of vertigo. Reports she has had this on and off for years and use to see ENT, however, he has retired. She reports over the last month she has had dizziness with bending over and looking up. She rides horses daily and this has interfered with this. She takes valium as needed.     She is currently taking Wegovy 2.4  mg. She has lost 39 lbs. However, her insurance will no longer pay for it.      07/28/2023   10:28 AM 04/28/2023   10:48 AM 12/27/2022   10:59 AM  Last 3 Weights  Weight (lbs) 161 lb 145 lb 172 lb 6.4 oz  Weight (kg) 73.029 kg 65.772 kg 78.2 kg     Dizziness The current episode started more than 1 year ago. The problem occurs intermittently. Associated symptoms include fatigue. The symptoms are aggravated by bending. She has tried rest and relaxation for the symptoms. The treatment provided mild relief.  Hypertension This is a chronic problem. The current episode started more than 1 year ago. The problem has been resolved since onset. The problem is controlled. Associated symptoms include anxiety. Pertinent negatives include no malaise/fatigue, peripheral edema or shortness of breath. Risk factors for coronary artery disease include dyslipidemia. The current treatment provides moderate improvement.  Anxiety Presents for follow-up visit. Symptoms include dizziness, excessive worry and nervous/anxious behavior. Patient reports no shortness of breath. Symptoms occur occasionally. The severity of symptoms is mild.     Hyperlipidemia This is a chronic problem. The current episode started more than 1 year ago. The problem is uncontrolled. Recent lipid tests were reviewed and are high. Pertinent negatives include no shortness of breath. Current antihyperlipidemic treatment includes diet change. The current treatment provides moderate improvement of lipids. Risk factors for coronary artery disease include dyslipidemia, hypertension, a sedentary lifestyle and post-menopausal.  Depression        This is a chronic problem.  The current episode started more than 1 year ago.   The problem occurs intermittently.  Associated symptoms include fatigue.  Associated symptoms include no helplessness, no hopelessness and not sad.  Past treatments include SSRIs - Selective serotonin reuptake inhibitors.  Past medical history includes anxiety.       Review of Systems  Constitutional:  Positive for fatigue. Negative for malaise/fatigue.  Respiratory:  Negative for shortness of breath.   Neurological:  Positive for dizziness.  Psychiatric/Behavioral:  The patient is nervous/anxious.   All other systems reviewed and are negative.  Family History  Problem Relation Age of Onset   Cancer Father        Throat   Hypertension Mother    Breast cancer Mother    Diabetes Maternal Grandmother    Social History   Socioeconomic History   Marital status: Married    Spouse name: Not on file   Number of children: Not on file   Years of education: Not on file   Highest  education level: Bachelor's degree (e.g., BA, AB, BS)  Occupational History   Occupation: Works with Horses  Tobacco Use   Smoking status: Never   Smokeless tobacco: Never  Vaping Use   Vaping status: Never Used  Substance and Sexual Activity   Alcohol use: Yes    Comment: 2-3 glassses of wine weekly   Drug use: No   Sexual activity: Yes  Other Topics Concern   Not on file  Social History Narrative   Not on file   Social Drivers of Health    Financial Resource Strain: Low Risk  (12/23/2022)   Overall Financial Resource Strain (CARDIA)    Difficulty of Paying Living Expenses: Not hard at all  Food Insecurity: No Food Insecurity (12/23/2022)   Hunger Vital Sign    Worried About Running Out of Food in the Last Year: Never true    Ran Out of Food in the Last Year: Never true  Transportation Needs: No Transportation Needs (12/23/2022)   PRAPARE - Administrator, Civil Service (Medical): No    Lack of Transportation (Non-Medical): No  Physical Activity: Sufficiently Active (12/23/2022)   Exercise Vital Sign    Days of Exercise per Week: 5 days    Minutes of Exercise per Session: 60 min  Stress: No Stress Concern Present (12/23/2022)   [employer] of Occupational Health - Occupational Stress Questionnaire    Feeling of Stress : Not at all  Social Connections: Unknown (12/23/2022)   Social Connection and Isolation Panel [NHANES]    Frequency of Communication with Friends and Family: Twice a week    Frequency of Social Gatherings with Friends and Family: Once a week    Attends Religious Services: Patient declined    Database administrator or Organizations: Yes    Attends Engineer, structural: More than 4 times per year    Marital Status: Married       Objective:   Physical Exam Vitals reviewed.  Constitutional:      General: She is not in acute distress.    Appearance: She is well-developed.  HENT:     Head: Normocephalic and atraumatic.     Right Ear: Tympanic membrane normal.     Left Ear: Tympanic membrane normal.  Eyes:     Pupils: Pupils are equal, round, and reactive to light.  Neck:     Thyroid: No thyromegaly.  Cardiovascular:     Rate and Rhythm: Normal rate and regular rhythm.     Heart sounds: Normal heart sounds. No murmur heard. Pulmonary:     Effort: Pulmonary effort is normal. No respiratory distress.     Breath sounds: Normal breath sounds. No wheezing.  Abdominal:      General: Bowel sounds are normal. There is no distension.     Palpations: Abdomen is soft.     Tenderness: There is no abdominal tenderness.  Musculoskeletal:        General: No tenderness. Normal range of motion.     Cervical back: Normal range of motion and neck supple.  Skin:    General: Skin is warm and dry.  Neurological:     Mental Status: She is alert and oriented to person, place, and time.     Cranial Nerves: No cranial nerve deficit.     Deep Tendon Reflexes: Reflexes are normal and symmetric.  Psychiatric:        Behavior: Behavior normal.        Thought Content: Thought content normal.  Judgment: Judgment normal.       BP 131/74   Pulse 74   Temp (!) 97.5 F (36.4 C) (Temporal)   Ht 5' 7.5" (1.715 m)   Wt 161 lb (73 kg)   LMP 04/16/2016 (Approximate)   SpO2 99%   BMI 24.84 kg/m      Assessment & Plan:   PARISS HOMMES comes in today with chief complaint of Medical Management of Chronic Issues (Discuss wt)   Diagnosis and orders addressed:  1. Benign paroxysmal positional vertigo, unspecified laterality - CMP14+EGFR - CBC with Differential/Platelet  2. Essential hypertension - CMP14+EGFR - CBC with Differential/Platelet  3. Fibromyalgia - CMP14+EGFR - CBC with Differential/Platelet  4. GAD (generalized anxiety disorder) - CMP14+EGFR - CBC with Differential/Platelet  5. Hyperlipidemia, unspecified hyperlipidemia type - CMP14+EGFR - CBC with Differential/Platelet - Lipid panel  6. Vitamin D deficiency - CMP14+EGFR - CBC with Differential/Platelet - VITAMIN D 25 Hydroxy (Vit-D Deficiency, Fractures)  7. Annual physical exam (Primary) - CMP14+EGFR - CBC with Differential/Platelet - Lipid panel - VITAMIN D 25 Hydroxy (Vit-D Deficiency, Fractures)    Labs pending PT reviewed in [place] controlled database, no red flags  Pt still has Valium 2 mg and Antivert at home. No refills today.  Health Maintenance reviewed Diet and exercise  encouraged  Follow up plan: 6 months    [name], FNP

## 2023-07-28 NOTE — Patient Instructions (Signed)

## 2023-07-29 LAB — CBC WITH DIFFERENTIAL/PLATELET
Basophils Absolute: 0.1 10*3/uL (ref 0.0–0.2)
Basos: 1 %
EOS (ABSOLUTE): 1.4 10*3/uL — ABNORMAL HIGH (ref 0.0–0.4)
Eos: 24 %
Hematocrit: 40.4 % (ref 34.0–46.6)
Hemoglobin: 13.4 g/dL (ref 11.1–15.9)
Immature Grans (Abs): 0 10*3/uL (ref 0.0–0.1)
Immature Granulocytes: 0 %
Lymphocytes Absolute: 1.4 10*3/uL (ref 0.7–3.1)
Lymphs: 24 %
MCH: 33.2 pg — ABNORMAL HIGH (ref 26.6–33.0)
MCHC: 33.2 g/dL (ref 31.5–35.7)
MCV: 100 fL — ABNORMAL HIGH (ref 79–97)
Monocytes Absolute: 0.4 10*3/uL (ref 0.1–0.9)
Monocytes: 7 %
Neutrophils Absolute: 2.6 10*3/uL (ref 1.4–7.0)
Neutrophils: 44 %
Platelets: 299 10*3/uL (ref 150–450)
RBC: 4.04 x10E6/uL (ref 3.77–5.28)
RDW: 13.1 % (ref 11.7–15.4)
WBC: 5.9 10*3/uL (ref 3.4–10.8)

## 2023-07-29 LAB — CMP14+EGFR
ALT: 59 IU/L — ABNORMAL HIGH (ref 0–32)
AST: 63 IU/L — ABNORMAL HIGH (ref 0–40)
Albumin: 4.4 g/dL (ref 3.8–4.9)
Alkaline Phosphatase: 37 IU/L — ABNORMAL LOW (ref 44–121)
BUN/Creatinine Ratio: 15 (ref 9–23)
BUN: 11 mg/dL (ref 6–24)
Bilirubin Total: 0.5 mg/dL (ref 0.0–1.2)
CO2: 25 mmol/L (ref 20–29)
Calcium: 9.4 mg/dL (ref 8.7–10.2)
Chloride: 96 mmol/L (ref 96–106)
Creatinine, Ser: 0.74 mg/dL (ref 0.57–1.00)
Globulin, Total: 2.5 g/dL (ref 1.5–4.5)
Glucose: 91 mg/dL (ref 70–99)
Potassium: 4.2 mmol/L (ref 3.5–5.2)
Sodium: 138 mmol/L (ref 134–144)
Total Protein: 6.9 g/dL (ref 6.0–8.5)
eGFR: 94 mL/min/{1.73_m2} (ref >59)

## 2023-07-29 LAB — LIPID PANEL
Chol/HDL Ratio: 3.4 ratio (ref 0.0–4.4)
Cholesterol, Total: 263 mg/dL — ABNORMAL HIGH (ref 100–199)
HDL: 78 mg/dL (ref >39)
LDL Chol Calc (NIH): 157 mg/dL — ABNORMAL HIGH (ref 0–99)
Triglycerides: 161 mg/dL — ABNORMAL HIGH (ref 0–149)
VLDL Cholesterol Cal: 28 mg/dL (ref 5–40)

## 2023-07-29 LAB — VITAMIN D 25 HYDROXY (VIT D DEFICIENCY, FRACTURES): Vit D, 25-Hydroxy: 40.8 ng/mL (ref 30.0–100.0)

## 2023-08-07 ENCOUNTER — Emergency Department (HOSPITAL_COMMUNITY)
Admission: EM | Admit: 2023-08-07 | Discharge: 2023-08-07 | Disposition: A | Discharge status: Home / Self Care | Attending: Emergency Medicine | Admitting: Emergency Medicine

## 2023-08-07 ENCOUNTER — Other Ambulatory Visit: Payer: Self-pay

## 2023-08-07 ENCOUNTER — Emergency Department (HOSPITAL_COMMUNITY)

## 2023-08-07 ENCOUNTER — Encounter (HOSPITAL_COMMUNITY): Payer: Self-pay

## 2023-08-07 DIAGNOSIS — Z79899 Other long term (current) drug therapy: Secondary | ICD-10-CM | POA: Diagnosis not present

## 2023-08-07 DIAGNOSIS — J45901 Unspecified asthma with (acute) exacerbation: Secondary | ICD-10-CM

## 2023-08-07 DIAGNOSIS — J3489 Other specified disorders of nose and nasal sinuses: Secondary | ICD-10-CM | POA: Diagnosis not present

## 2023-08-07 DIAGNOSIS — Z7951 Long term (current) use of inhaled steroids: Secondary | ICD-10-CM | POA: Diagnosis not present

## 2023-08-07 DIAGNOSIS — I10 Essential (primary) hypertension: Secondary | ICD-10-CM | POA: Diagnosis not present

## 2023-08-07 DIAGNOSIS — J210 Acute bronchiolitis due to respiratory syncytial virus: Secondary | ICD-10-CM | POA: Diagnosis not present

## 2023-08-07 DIAGNOSIS — R0602 Shortness of breath: Secondary | ICD-10-CM | POA: Diagnosis not present

## 2023-08-07 DIAGNOSIS — R059 Cough, unspecified: Secondary | ICD-10-CM | POA: Diagnosis not present

## 2023-08-07 DIAGNOSIS — J4521 Mild intermittent asthma with (acute) exacerbation: Secondary | ICD-10-CM | POA: Diagnosis not present

## 2023-08-07 DIAGNOSIS — J9811 Atelectasis: Secondary | ICD-10-CM | POA: Diagnosis not present

## 2023-08-07 DIAGNOSIS — J984 Other disorders of lung: Secondary | ICD-10-CM | POA: Diagnosis not present

## 2023-08-07 DIAGNOSIS — E875 Hyperkalemia: Secondary | ICD-10-CM | POA: Insufficient documentation

## 2023-08-07 LAB — RESP PANEL BY RT-PCR (RSV, FLU A&B, COVID)  RVPGX2
Influenza A by PCR: NEGATIVE
Influenza B by PCR: NEGATIVE
Resp Syncytial Virus by PCR: POSITIVE — AB
SARS Coronavirus 2 by RT PCR: NEGATIVE

## 2023-08-07 LAB — CBC WITH DIFFERENTIAL/PLATELET
Abs Immature Granulocytes: 0.01 10*3/uL (ref 0.00–0.07)
Basophils Absolute: 0.1 10*3/uL (ref 0.0–0.1)
Basophils Relative: 1 %
Eosinophils Absolute: 0.6 10*3/uL — ABNORMAL HIGH (ref 0.0–0.5)
Eosinophils Relative: 13 %
HCT: 37.2 % (ref 36.0–46.0)
Hemoglobin: 12.6 g/dL (ref 12.0–15.0)
Immature Granulocytes: 0 %
Lymphocytes Relative: 31 %
Lymphs Abs: 1.6 10*3/uL (ref 0.7–4.0)
MCH: 33.3 pg (ref 26.0–34.0)
MCHC: 33.9 g/dL (ref 30.0–36.0)
MCV: 98.4 fL (ref 80.0–100.0)
Monocytes Absolute: 0.6 10*3/uL (ref 0.1–1.0)
Monocytes Relative: 12 %
Neutro Abs: 2.2 10*3/uL (ref 1.7–7.7)
Neutrophils Relative %: 43 %
Platelets: 258 10*3/uL (ref 150–400)
RBC: 3.78 MIL/uL — ABNORMAL LOW (ref 3.87–5.11)
RDW: 14.7 % (ref 11.5–15.5)
WBC: 5.1 10*3/uL (ref 4.0–10.5)
nRBC: 0 % (ref 0.0–0.2)

## 2023-08-07 LAB — COMPREHENSIVE METABOLIC PANEL WITH GFR
ALT: 38 U/L (ref 0–44)
AST: 42 U/L — ABNORMAL HIGH (ref 15–41)
Albumin: 3.5 g/dL (ref 3.5–5.0)
Alkaline Phosphatase: 24 U/L — ABNORMAL LOW (ref 38–126)
Anion gap: 10 (ref 5–15)
BUN: 17 mg/dL (ref 6–20)
CO2: 27 mmol/L (ref 22–32)
Calcium: 9.4 mg/dL (ref 8.9–10.3)
Chloride: 101 mmol/L (ref 98–111)
Creatinine, Ser: 0.69 mg/dL (ref 0.44–1.00)
GFR, Estimated: >60 mL/min (ref >60)
Glucose, Bld: 111 mg/dL — ABNORMAL HIGH (ref 70–99)
Potassium: 5.3 mmol/L — ABNORMAL HIGH (ref 3.5–5.1)
Sodium: 138 mmol/L (ref 135–145)
Total Bilirubin: 0.6 mg/dL (ref 0.0–1.2)
Total Protein: 6.5 g/dL (ref 6.5–8.1)

## 2023-08-07 MED ORDER — ALBUTEROL SULFATE (2.5 MG/3ML) 0.083% IN NEBU
5.0000 mg | INHALATION_SOLUTION | Freq: Once | RESPIRATORY_TRACT | Status: AC
Start: 2023-08-07 — End: 2023-08-07
  Administered 2023-08-07: 5 mg via RESPIRATORY_TRACT
  Filled 2023-08-07: qty 6

## 2023-08-07 MED ORDER — PREDNISONE 20 MG PO TABS: 40.0000 mg | ORAL_TABLET | Freq: Every day | ORAL | 0 refills | Status: DC

## 2023-08-07 MED ORDER — PREDNISONE 20 MG PO TABS
40.0000 mg | ORAL_TABLET | Freq: Once | ORAL | Status: AC
  Administered 2023-08-07: 40 mg via ORAL
  Filled 2023-08-07: qty 2

## 2023-08-07 MED ORDER — IPRATROPIUM BROMIDE 0.02 % IN SOLN
0.5000 mg | Freq: Once | RESPIRATORY_TRACT | Status: AC
  Administered 2023-08-07: 0.5 mg via RESPIRATORY_TRACT
  Filled 2023-08-07: qty 2.5

## 2023-08-07 NOTE — ED Provider Notes (Signed)
 [facility] AT [facility] Provider Note   CSN: [id number] Arrival date & time: 08/07/23  1057     History {Add pertinent medical, surgical, social history, OB history to HPI:1} Chief Complaint  Patient presents with  . Cough  . Shortness of Breath    Rita Barry is a 58 y.o. female.   Cough Associated symptoms: chest pain, chills and shortness of breath   Associated symptoms: no ear pain, no fever, no headaches, no myalgias, no rash and no sore throat   Shortness of Breath Associated symptoms: chest pain and cough   Associated symptoms: no abdominal pain, no ear pain, no fever, no headaches, no neck pain, no rash, no sore throat and no vomiting         Rita Barry is a 58 y.o. female past medical history of hypertension, GAD, fibromyalgia, and reported asthma who presents to the Emergency Department complaining of 2-day history of nasal congestion that seem to move into her chest, began having productive cough, wheezing and shortness of breath that is mostly exertional.  Some chest tightness associated with coughing.  She has used her albuterol inhaler at home without improvement.  She has had some loose stools yesterday.  Stools have been nonbloody or black.  She also endorses having some chills and night sweats.  No nausea or vomiting.  No abdominal pain or dysuria symptoms no recent sick contacts.   Home Medications Prior to Admission medications   Medication Sig Start Date End Date Taking? Authorizing Provider  albuterol (VENTOLIN HFA) 108 (90 Base) MCG/ACT inhaler Inhale 2 puffs into the lungs every 6 (six) hours as needed for wheezing or shortness of breath. 04/28/23   Junie Spencer, FNP  diazepam (VALIUM) 2 MG tablet Take 1 tablet (2 mg total) by mouth every 12 (twelve) hours as needed for anxiety. 12/27/22   [name] A, FNP  escitalopram (LEXAPRO) 20 MG tablet Take 1 tablet (20 mg total) by mouth daily. 04/28/23   [name] A, FNP  hydrochlorothiazide (MICROZIDE) 12.5 MG capsule TAKE 1 CAPSULE BY MOUTH EVERY DAY 06/06/23   [name] A, FNP  Meclizine HCl (ANTIVERT) 25 MG CHEW Chew 1 tablet (25 mg total) by mouth every 8 (eight) hours. 12/27/22   Junie Spencer, FNP  Multiple Vitamins-Minerals (WOMENS MULTI VITAMIN & MINERAL PO) Take 1 tablet by mouth daily.    [provider]  Semaglutide-Weight Management (WEGOVY) 2.4 MG/0.75ML SOAJ INJECT 2.4 MG INTO THE SKIN ONCE A WEEK. Patient not taking: Reported on 07/28/2023 05/30/23   Junie Spencer, FNP      Allergies    Penicillins    Review of Systems   Review of Systems  Constitutional:  Positive for chills. Negative for appetite change and fever.  HENT:  Positive for congestion. Negative for ear pain, sore throat and trouble swallowing.   Respiratory:  Positive for cough, chest tightness and shortness of breath.   Cardiovascular:  Positive for chest pain.  Gastrointestinal:  Positive for diarrhea. Negative for abdominal pain, nausea and vomiting.  Genitourinary:  Negative for difficulty urinating and dysuria.  Musculoskeletal:  Negative for back pain, myalgias, neck pain and neck stiffness.  Skin:  Negative for rash.  Neurological:  Negative for dizziness, weakness, numbness and headaches.    Physical Exam Updated Vital Signs BP 135/86   Pulse 78   Resp 18   Ht 5' 7.5" (1.715 m)   Wt 73 kg   LMP  04/16/2016 (Approximate)   SpO2 93%   BMI 24.83 kg/m  Physical Exam Vitals and nursing note reviewed.  Constitutional:      Appearance: She is not toxic-appearing.  HENT:     Right Ear: Tympanic membrane and ear canal normal.     Left Ear: Tympanic membrane and ear canal normal.     Nose: No rhinorrhea.     Mouth/Throat:     Mouth: Mucous membranes are moist.     Pharynx: Oropharynx is clear. No oropharyngeal exudate or posterior oropharyngeal erythema.  Eyes:     Conjunctiva/sclera: Conjunctivae normal.  Cardiovascular:      Rate and Rhythm: Normal rate and regular rhythm.     Pulses: Normal pulses.  Pulmonary:     Breath sounds: Wheezing and rhonchi present. No rales.     Comments: Mild increased work of breathing on exam.  Scattered expiratory wheezes and rhonchi Abdominal:     Palpations: Abdomen is soft.     Tenderness: There is no abdominal tenderness.  Musculoskeletal:        General: Normal range of motion.     Cervical back: Normal range of motion. No rigidity.     Right lower leg: No edema.     Left lower leg: No edema.  Lymphadenopathy:     Cervical: No cervical adenopathy.  Skin:    General: Skin is warm.     Capillary Refill: Capillary refill takes less than 2 seconds.  Neurological:     General: No focal deficit present.     Mental Status: She is alert.     Sensory: No sensory deficit.     Motor: No weakness.    ED Results / Procedures / Treatments   Labs (all labs ordered are listed, but only abnormal results are displayed) Labs Reviewed  RESP PANEL BY RT-PCR (RSV, FLU A&B, COVID)  RVPGX2 - Abnormal; Notable for the following components:      Result Value   Resp Syncytial Virus by PCR POSITIVE (*)    All other components within normal limits  CBC WITH DIFFERENTIAL/PLATELET - Abnormal; Notable for the following components:   RBC 3.78 (*)    Eosinophils Absolute 0.6 (*)    All other components within normal limits  COMPREHENSIVE METABOLIC PANEL WITH GFR - Abnormal; Notable for the following components:   Potassium 5.3 (*)    Glucose, Bld 111 (*)    AST 42 (*)    Alkaline Phosphatase 24 (*)    All other components within normal limits    EKG EKG Interpretation Date/Time:  Sunday August 07 2023 11:09:50 EDT Ventricular Rate:  83 PR Interval:  137 QRS Duration:  90 QT Interval:  388 QTC Calculation: 456 R Axis:   70  Text Interpretation: Sinus rhythm Normal ECG No old tracing to compare Confirmed by Eber Hong ([id number]) on 08/07/2023 11:12:50 AM  Radiology DG Chest  Portable 1 View Result Date: 08/07/2023 CLINICAL DATA:  Cough and shortness of breath. EXAM: PORTABLE CHEST 1 VIEW COMPARISON:  Chest radiograph dated 04/23/2019. FINDINGS: The heart size and mediastinal contours are within normal limits. Mild linear scarring/atelectasis at the left lung base. No focal consolidation, sizeable pleural effusion, or pneumothorax. No acute osseous abnormality. IMPRESSION: Mild left basilar linear scarring/atelectasis. Otherwise, no acute cardiopulmonary findings. Electronically Signed   By: Hart Robinsons M.D.   On: 08/07/2023 11:46    Procedures Procedures  {Document cardiac monitor, telemetry assessment procedure when appropriate:1}  Medications Ordered in ED Medications  albuterol (  PROVENTIL) (2.5 MG/3ML) 0.083% nebulizer solution 5 mg (has no administration in time range)  ipratropium (ATROVENT) nebulizer solution 0.5 mg (has no administration in time range)    ED Course/ Medical Decision Making/ A&P   {   Click here for ABCD2, HEART and other calculatorsREFRESH Note before signing :1}                              Medical Decision Making Patient here with reported history of asthma URI type symptoms that began 2 days ago.  No reported fever.  Has been using her albuterol inhaler without improvement.  Noticed increased wheezing tightness of her chest with cough.  No history of coronary artery disease she is a non-smoker.  I suspect viral process although pneumonia, asthma exacerbation, bronchitis, PE also considered in differential  Amount and/or Complexity of Data Reviewed Labs: ordered. Radiology: ordered. Discussion of management or test interpretation with external provider(s): Patient ambulated in the department without hypoxia or difficulty.  Reports feeling much better after receiving albuterol treatment and steroid. Mild hyperkalemia here, I have instructed patient to use her albuterol MDI every 4 -6 hours for the next several days, anticipate  this will help lower her potassium as well.  Do not feel she that she emergently needs other potassium correction.  She will follow-up closely outpatient with PCP to have her potassium level rechecked later this week.  She is agreeable to symptomatic treatment for her RSV, prescription given for steroids for her breathing.  She does request referral to cardiology as she has had history of palpitations although none at present and EKG today shows a sinus rhythm  Risk Prescription drug management.     {Document critical care time when appropriate:1} {Document review of labs and clinical decision tools ie heart score, Chads2Vasc2 etc:1}  {Document your independent review of radiology images, and any outside records:1} {Document your discussion with family members, caretakers, and with consultants:1} {Document social determinants of health affecting pt's care:1} {Document your decision making why or why not admission, treatments were needed:1} Final Clinical Impression(s) / ED Diagnoses Final diagnoses:  None    Rx / DC Orders ED Discharge Orders     None

## 2023-08-07 NOTE — ED Notes (Signed)
 Patient ambulated around the nurses station and patients pulse ox stayed around 98% and pulse stayed at 92. Patient stated she felt great and was not short of breath.

## 2023-08-07 NOTE — Discharge Instructions (Signed)
 Your workup today shows that you have RSV.  This may have exacerbated your asthma.  I recommend that you use your albuterol inhaler 1 to 2 puffs every 4-6 hours for the next 2 to 3 days.  You may take Tylenol every 4 hours if needed for body aches and/or fever.  You have been provided prescription for prednisone which is a steroid.  Please start the prescription tomorrow.  Also as discussed your potassium level today was slightly elevated.  The albuterol will help lower your potassium, but I recommend that you have your primary care provider recheck your potassium later this week.  Return to the emergency department if you develop any new or worsening symptoms.

## 2023-08-07 NOTE — ED Triage Notes (Signed)
 Pt stated that she has been having congestion that has moved to her lungs for the last 4 days. Pt stated that she asthma and has wheezing when she breathes and wants to be checked.

## 2023-08-11 ENCOUNTER — Encounter: Payer: Self-pay | Admitting: Nurse Practitioner

## 2023-08-11 ENCOUNTER — Ambulatory Visit: Admitting: Nurse Practitioner

## 2023-08-11 VITALS — BP 101/60 | HR 70 | Temp 97.2°F | Ht 67.0 in | Wt 171.0 lb

## 2023-08-11 DIAGNOSIS — Z09 Encounter for follow-up examination after completed treatment for conditions other than malignant neoplasm: Secondary | ICD-10-CM | POA: Diagnosis not present

## 2023-08-11 DIAGNOSIS — J210 Acute bronchiolitis due to respiratory syncytial virus: Secondary | ICD-10-CM

## 2023-08-11 DIAGNOSIS — E876 Hypokalemia: Secondary | ICD-10-CM | POA: Diagnosis not present

## 2023-08-11 MED ORDER — TRELEGY ELLIPTA 100-62.5-25 MCG/ACT IN AEPB: 1.0000 | INHALATION_SPRAY | Freq: Every day | RESPIRATORY_TRACT | Status: DC

## 2023-08-11 NOTE — Patient Instructions (Signed)
Respiratory Syncytial Virus Infection, Adult Respiratory syncytial virus (RSV) infection is an infection caused by RSV, a common virus. This virus is similar to viruses that cause the common cold and the flu. RSV infection can affect the nose, throat, windpipe, and lungs (respiratory system). When the infection is severe, it can cause: Bronchiolitis. This condition causes inflammation of the air passages in the lungs (bronchioles). Pneumonia. This condition causes inflammation of the air sacs in the lungs. RSV infection spreads from person to person (is contagious) through droplets from coughs and sneezes (respiratory secretions). This condition is rarely serious when it occurs in adults. What are the causes? This condition is caused by contact with RSV. This can happen by: Breathing respiratory secretions from someone who has the infection. Touching something that has been exposed to the virus (is contaminated) and then touching your mouth, nose, or eyes. Coming in close contact with someone who has this infection. This may happen if you: Hug or kiss. Shake or hold hands. Eat or drink using the same dishes or utensils. What increases the risk? The following factors may make you more likely to develop this condition: Being 65 years of age or older. Having certain health conditions, including: A long-term (chronic) lung condition, such as chronic obstructive pulmonary disease (COPD). An immune system that is weak. This is your body's defense system. Down syndrome. Heart disease. Working in a hospital or other health care facility. Living in a long-term health care facility. RSV infections are most common from the months of November to April, but they can happen any time of year. What are the signs or symptoms? Symptoms of this condition include: Having a runny nose. Coughing. You may have a cough that brings up mucus (productive cough). Sneezing. Having a fever. Wanting to eat less than  usual. Breathing loudly (wheezing). Having shortness of breath. Having fluid build up in the lungs (respiratory distress). How is this diagnosed? This condition may be diagnosed based on: Your symptoms. Your medical history. A physical exam. A chest X-ray to rule out pneumonia. Blood tests or tests of mucus from your lungs (sputum). These tests may be done for older adults. A test of a sample of your respiratory secretions. How is this treated? In most cases, the RSV infection will go away after 1-2 weeks of caring for yourself at home.  Sometimes, RSV infection is severe and can cause bronchiolitis or pneumonia. If you develop one or both of these conditions, you may need to be treated in the hospital. You may be given: Oxygen therapy. Antiviral medicine. Medicines to open your bronchioles (bronchodilators). Follow these instructions at home: Medicines Take over-the-counter and prescription medicines only as told by your health care provider. If you were prescribed an antiviral medicine, take it as told by your health care provider. Do not stop using the antiviral even if you start to feel better. Lifestyle  Eat a healthy diet. Do not drink alcohol. Do not use any products that contain nicotine or tobacco, such as cigarettes, e-cigarettes, and chewing tobacco. If you need help quitting, ask your health care provider. Rest at home until your symptoms go away. Return to your normal activities as told by your health care provider. Ask your health care provider what activities are safe for you. General instructions  Drink enough fluid to keep your urine pale yellow. Gargle with a salt-water mixture 3-4 times a day or as needed. To make a salt-water mixture, completely dissolve -1 tsp (3-6 g) of salt in 1   cup (237 mL) of warm water. Keep all follow-up visits as told by your health care provider. This is important. How is this prevented? To prevent catching and spreading RSV: Wash  your hands often with soap and water for at least 20 seconds. If soap and water are not available, use hand sanitizer. Do not touch your face without first cleaning your hands. Stay home if you have symptoms of the common cold or the flu. Cover your nose and mouth when you cough or sneeze. Avoid large groups of people. Keep a safe distance of about 6 feet (1.8 m) from people who are coughing or sneezing. Where to find more information Centers for Disease Control and Prevention: www.cdc.gov Contact a health care provider if: Your symptoms get worse or have not changed after 2 weeks. You have: A fever. Hot flashes, sweating, or chills that keep happening. A cough that brings up much more mucus than usual. A cough that brings up blood. You feel: Very tired (lethargic). Confused. Get help right away if: You have increased or severe trouble breathing. You lose consciousness. These symptoms may represent a serious problem that is an emergency. Do not wait to see if the symptoms will go away. Get medical help right away. Call your local emergency services (911 in the [place] Do not drive yourself to the hospital. Summary Respiratory syncytial virus (RSV) infection is an infection caused by RSV, a common virus. RSV infection can affect the nose, throat, windpipe, and lungs (respiratory system). When the infection is severe, it can cause bronchiolitis or pneumonia. Take over-the-counter and prescription medicines only as told by your health care provider. Contact a health care provider if your symptoms get worse or have not changed after 2 weeks. This information is not intended to replace advice given to you by your health care provider. Make sure you discuss any questions you have with your health care provider. Document Revised: 01/31/2019 Document Reviewed: 02/07/2019 Elsevier Patient Education  2022 [employer]  

## 2023-08-11 NOTE — Progress Notes (Signed)
   Subjective:    Patient ID: Rita Barry, female    DOB: 1965/06/05, 58 y.o.   MRN: [id number]   Chief Complaint: hospital follow up  Patient went to the ED on 08/07/23 with wheezing and SOB. She was dx with RSV. She was discharged and told to use albuterol every 4-6 hours and take steroids that were prescribed. Her potassium was slightly low in ED so we will need to recheck that today. Patients states doing better . Still has slight SOB with activity.  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  Eyes:  Negative for pain.  Respiratory:  Negative for shortness of breath.   Cardiovascular:  Negative for chest pain, palpitations and leg swelling.  Gastrointestinal:  Negative for abdominal pain.  Endocrine: Negative for polydipsia.  Skin:  Negative for rash.  Neurological:  Negative for dizziness, weakness and headaches.  Hematological:  Does not bruise/bleed easily.  All other systems reviewed and are negative.      Objective:   Physical Exam Constitutional:      Appearance: Normal appearance.  Cardiovascular:     Rate and Rhythm: Normal rate and regular rhythm.     Heart sounds: Normal heart sounds.  Pulmonary:     Effort: Pulmonary effort is normal.     Breath sounds: Normal breath sounds.  Neurological:     General: No focal deficit present.     Mental Status: She is alert and oriented to  person, place, and time.  Psychiatric:        Mood and Affect: Mood normal.        Behavior: Behavior normal.     BP 101/60   Pulse 70   Temp (!) 97.2 F (36.2 C) (Temporal)   Ht 5\' 7"  (1.702 m)   Wt 171 lb (77.6 kg)   LMP 04/16/2016 (Approximate)   SpO2 95%   BMI 26.78 kg/m        Assessment & Plan:   Rita Barry in today with chief complaint of Hospitalization Follow-up   1. Respiratory syncytial virus (RSV) as cause of acute bronchiolitis (Primary) Has history of asthma Ref to pulmonology Trelogy sample  1 puff daily Albuterol as needed  2. Hospital discharge follow-up Hospital records reviewed  3. Hypokalemia Labs pending  The above assessment and management plan was discussed with the patient. The patient verbalized understanding of and has agreed to the management plan. Patient is aware to call the clinic if symptoms persist or worsen. Patient is aware when to return to the clinic for a follow-up visit. Patient educated on when it is appropriate to go to the emergency department.   Mary-Margaret Daphine Deutscher, FNP

## 2023-08-12 ENCOUNTER — Inpatient Hospital Stay: Admitting: Nurse Practitioner

## 2023-08-12 LAB — BMP8+EGFR
BUN/Creatinine Ratio: 22 (ref 9–23)
BUN: 15 mg/dL (ref 6–24)
CO2: 23 mmol/L (ref 20–29)
Calcium: 9.4 mg/dL (ref 8.7–10.2)
Chloride: 98 mmol/L (ref 96–106)
Creatinine, Ser: 0.69 mg/dL (ref 0.57–1.00)
Glucose: 110 mg/dL — ABNORMAL HIGH (ref 70–99)
Potassium: 5.3 mmol/L — ABNORMAL HIGH (ref 3.5–5.2)
Sodium: 137 mmol/L (ref 134–144)
eGFR: 101 mL/min/{1.73_m2} (ref >59)

## 2023-08-15 DIAGNOSIS — Z1231 Encounter for screening mammogram for malignant neoplasm of breast: Secondary | ICD-10-CM | POA: Diagnosis not present

## 2023-08-15 LAB — HM MAMMOGRAPHY

## 2023-08-17 ENCOUNTER — Other Ambulatory Visit: Payer: Self-pay | Admitting: Family

## 2023-08-17 DIAGNOSIS — F411 Generalized anxiety disorder: Secondary | ICD-10-CM

## 2023-08-17 DIAGNOSIS — F331 Major depressive disorder, recurrent, moderate: Secondary | ICD-10-CM

## 2023-10-28 ENCOUNTER — Ambulatory Visit: Admitting: Family

## 2023-10-30 NOTE — Progress Notes (Unsigned)
 Rita  ZAIDE Barry, female    DOB: 1966/02/20    MRN: [id number]   Brief patient profile:  75  yowf never smoker from [place]  developed allergic rhinitis with cough no wheeze or need for inhalers and better p got off Darci  moved to [facility] 2003 with cough ? Better with albuterol  briefly  referred to pulmonary clinic in [place]  11/01/2023 by Ronal Lunger  for persistent cough p RSV dx 08/07/23 using alb 2 puffs each.   Pt not previously seen by PCCM service.    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    Also denies any obvious fluctuation of symptoms with weather or environmental changes or other aggravating or alleviating factors except as outlined above   No unusual exposure hx or h/o childhood pna/ asthma or knowledge of premature birth.  Current Allergies, Complete Past Medical History, Past Surgical History, Family History, and Social History were reviewed in [employer] record.  ROS  The following are not active complaints unless bolded Hoarseness, sore throat, dysphagia, dental problems, itching, sneezing,  nasal congestion or discharge of excess mucus or purulent secretions, ear ache,   fever, chills, sweats, unintended wt loss or wt gain, classically pleuritic or exertional cp,  orthopnea pnd or arm/hand swelling  or leg swelling, presyncope, palpitations, abdominal pain, anorexia, nausea, vomiting, diarrhea  or change in bowel habits or change in bladder habits, change in stools or  change in urine, dysuria, hematuria,  rash, arthralgias, visual complaints, headache, numbness, weakness or ataxia or problems with walking or coordination,  change in mood or  memory.            Outpatient Medications Prior to Visit  Medication Sig Dispense Refill   albuterol  (VENTOLIN  HFA) 108 (90 Base) MCG/ACT inhaler Inhale 2 puffs into the lungs every 6 (six) hours as needed for wheezing or shortness of breath. 8 g 0   diazepam  (VALIUM ) 2 MG tablet Take 1 tablet (2 mg total) by mouth every 12 (twelve) hours as needed for anxiety. 30 tablet 0   escitalopram  (LEXAPRO ) 20 MG tablet TAKE 1 TABLET BY MOUTH EVERY DAY 90 tablet 0   Fluticasone -Umeclidin-Vilant (TRELEGY ELLIPTA ) 100-62.5-25 MCG/ACT AEPB Inhale 1 puff into the lungs daily.     hydrochlorothiazide  (MICROZIDE ) 12.5 MG capsule TAKE 1 CAPSULE BY MOUTH EVERY DAY 90 capsule 1   Meclizine  HCl (ANTIVERT ) 25 MG CHEW Chew 1 tablet (25 mg total) by mouth every 8 (eight) hours. 90 tablet 1   Multiple Vitamins-Minerals (WOMENS MULTI VITAMIN & MINERAL PO) Take 1 tablet by mouth daily.     Semaglutide -Weight Management (WEGOVY ) 2.4 MG/0.75ML SOAJ INJECT 2.4 MG INTO THE SKIN ONCE A WEEK. 3 mL 0   No facility-administered medications prior to visit.    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   EGD  FEB 2018 35 CM 22 HPF, 20 CM 23 HPF   Fibromyalgia    Questionable      Objective:     BP 114/73   Pulse 82   Ht 5' 7 (1.702 m)   Wt 168 lb 12.8 oz (76.6 kg)   LMP 04/16/2016 (Approximate)   SpO2 100%   BMI 26.44 kg/m   SpO2: 100 % RA  Pleasant healthy appearing amb wf nad   HEENT : Oropharynx  nl      Nasal turbinates mild non-specific edema   NECK :  without  apparent JVD/ palpable Nodes/TM    LUNGS: no acc muscle use,  Nl contour chest which is clear to A and P bilaterally without cough on insp or exp  maneuvers   CV:  RRR  no s3 or murmur or increase in P2, and no edema   ABD:  soft and nontender   MS:  Gait nl  ext warm without deformities Or obvious joint restrictions  calf tenderness, cyanosis or clubbing    SKIN: warm and dry without lesions    NEURO:  alert, approp, nl sensorium with  no motor or cerebellar deficits apparent.       Assessment   Cough variant asthma Onset p moved to [place] around 2003 on background of probable allergic rhinitis  11/01/2023  After extensive coaching inhaler device,  effectiveness =    80% (trigger at onset of insp) > try dulera 100 2 each am and then pm doses prn  - Allergy screen 11/01/2023 >  Eos 0. /  IgE    Most likely this is allergic in nature most of the time and more symptomatic since RSV but still very mild and should do a lot better on low dose laba/ics Based on two studies from NEJM  378; 20 p 1865 (2018) and 380 : p2020-30 (2019) in pts with mild asthma it is reasonable to use low dose symbicort eg 80 (and by inference dulera 100)  2bid prn flare in this setting but I emphasized this was only shown with symbicort (not apparently  on her insurance plan but dulera 100 close enough for now) and takes advantage of the rapid onset of action but is not the same as rescue therapy but can be stopped once the acute symptoms have resolved and the need for rescue has been minimized (< 2 x weekly)    F/u can be 3 months, sooner if needed          Each maintenance medication was reviewed in detail including emphasizing most importantly the difference between maintenance and prns and under what circumstances the prns are to be triggered using an action plan format where appropriate.  Total time for H and P, chart review, counseling, reviewing hfa device(s) and generating customized AVS unique to this office visit / same day charting = 45 min new pt eval.           Ozell America, MD 11/01/2023

## 2023-11-01 ENCOUNTER — Encounter: Payer: Self-pay | Admitting: Internal Medicine

## 2023-11-01 ENCOUNTER — Ambulatory Visit: Admitting: Internal Medicine

## 2023-11-01 VITALS — BP 114/73 | HR 82 | Ht 67.0 in | Wt 168.8 lb

## 2023-11-01 DIAGNOSIS — J45991 Cough variant asthma: Secondary | ICD-10-CM | POA: Diagnosis not present

## 2023-11-01 MED ORDER — MOMETASONE FURO-FORMOTEROL FUM 100-5 MCG/ACT IN AERO: INHALATION_SPRAY | RESPIRATORY_TRACT | 11 refills | Status: AC

## 2023-11-01 MED ORDER — BREZTRI AEROSPHERE 160-9-4.8 MCG/ACT IN AERO: 2.0000 | INHALATION_SPRAY | Freq: Two times a day (BID) | RESPIRATORY_TRACT | Status: AC

## 2023-11-01 NOTE — Assessment & Plan Note (Signed)
 Onset p moved to [place] around 2003 on background of probable allergic rhinitis  11/01/2023  After extensive coaching inhaler device,  effectiveness =    80% (trigger at onset of insp) > try dulera 100 2 each am and then pm doses prn  - Allergy screen 11/01/2023 >  Eos 0. /  IgE    Most likely this is allergic in nature most of the time and more symptomatic since RSV but still very mild and should do a lot better on low dose laba/ics Based on two studies from NEJM  378; 20 p 1865 (2018) and 380 : p2020-30 (2019) in pts with mild asthma it is reasonable to use low dose symbicort eg 80 (and by inference dulera 100)  2bid prn flare in this setting but I emphasized this was only shown with symbicort (not apparently  on her insurance plan but dulera 100 close enough for now) and takes advantage of the rapid onset of action but is not the same as rescue therapy but can be stopped once the acute symptoms have resolved and the need for rescue has been minimized (< 2 x weekly)    F/u can be 3 months, sooner if needed          Each maintenance medication was reviewed in detail including emphasizing most importantly the difference between maintenance and prns and under what circumstances the prns are to be triggered using an action plan format where appropriate.  Total time for H and P, chart review, counseling, reviewing hfa device(s) and generating customized AVS unique to this office visit / same day charting = 45 min new pt eval.

## 2023-11-01 NOTE — Patient Instructions (Addendum)
 Plan A = Automatic = Always=    Dulera 100  2 puffs first thing in am and repeat 12 hours later if not perfect. Dory same is one puff every 12 hours)   Work on inhaler technique:  relax and gently blow all the way out then take a nice smooth full deep breath back in, triggering the inhaler at same time you start breathing in.  Hold breath in for at least  5 seconds if you can. Blow out [place] and breztri  thru nose. Rinse and gargle with water  when done.  If mouth or throat bother you at all,  try brushing teeth/gums/tongue with arm and hammer toothpaste/ make a slurry and gargle and spit out.     Plan B = Backup (to supplement plan A, not to replace it) Only use your albuterol  inhaler as a rescue medication to be used if you can't catch your breath by resting or doing a relaxed purse lip breathing pattern.  - The less you use it, the better it will work when you need it. - Ok to use the inhaler up to 2 puffs  every 4 hours if you must but call for appointment if use goes up over your usual need - Don't leave home without it !!  (think of it like the spare tire for your car)   Please remember to go to the lab department   for your tests - we will call you with the results when they are available.   Please schedule a follow up office visit in 6 weeks, call sooner if needed with all medications /inhalers/ solutions in hand so we can verify exactly what you are taking. This includes all medications from all doctors and over the counters

## 2023-11-04 LAB — CBC WITH DIFFERENTIAL/PLATELET
Basophils Absolute: 0.1 x10E3/uL (ref 0.0–0.2)
Basos: 1 %
EOS (ABSOLUTE): 1.4 x10E3/uL — ABNORMAL HIGH (ref 0.0–0.4)
Eos: 21 %
Hematocrit: 43.5 % (ref 34.0–46.6)
Hemoglobin: 14.3 g/dL (ref 11.1–15.9)
Immature Grans (Abs): 0 x10E3/uL (ref 0.0–0.1)
Immature Granulocytes: 0 %
Lymphocytes Absolute: 1.5 x10E3/uL (ref 0.7–3.1)
Lymphs: 23 %
MCH: 33.5 pg — ABNORMAL HIGH (ref 26.6–33.0)
MCHC: 32.9 g/dL (ref 31.5–35.7)
MCV: 102 fL — ABNORMAL HIGH (ref 79–97)
Monocytes Absolute: 0.5 x10E3/uL (ref 0.1–0.9)
Monocytes: 8 %
Neutrophils Absolute: 3 x10E3/uL (ref 1.4–7.0)
Neutrophils: 47 %
Platelets: 317 x10E3/uL (ref 150–450)
RBC: 4.27 x10E6/uL (ref 3.77–5.28)
RDW: 12.2 % (ref 11.7–15.4)
WBC: 6.4 x10E3/uL (ref 3.4–10.8)

## 2023-11-04 LAB — IGE: IgE (Immunoglobulin E), Serum: 273 [IU]/mL (ref 6–495)

## 2023-11-05 ENCOUNTER — Ambulatory Visit: Payer: Self-pay | Admitting: Internal Medicine

## 2023-11-05 DIAGNOSIS — J45991 Cough variant asthma: Secondary | ICD-10-CM

## 2023-11-08 NOTE — Progress Notes (Signed)
 Spoke with pt regarding her labs confirmed understanding and she stated it is fine for me to refer her to [facility], [facility]

## 2023-11-11 ENCOUNTER — Other Ambulatory Visit: Payer: Self-pay | Admitting: Family

## 2023-11-11 DIAGNOSIS — F411 Generalized anxiety disorder: Secondary | ICD-10-CM

## 2023-11-11 DIAGNOSIS — F331 Major depressive disorder, recurrent, moderate: Secondary | ICD-10-CM

## 2023-11-15 DIAGNOSIS — D225 Melanocytic nevi of trunk: Secondary | ICD-10-CM | POA: Diagnosis not present

## 2023-11-15 DIAGNOSIS — Z1283 Encounter for screening for malignant neoplasm of skin: Secondary | ICD-10-CM | POA: Diagnosis not present

## 2023-11-18 ENCOUNTER — Encounter: Payer: Self-pay | Admitting: Family

## 2023-11-18 ENCOUNTER — Ambulatory Visit: Admitting: Family

## 2023-11-18 VITALS — BP 117/71 | HR 87 | Temp 96.4°F | Ht 67.0 in | Wt 171.6 lb

## 2023-11-18 DIAGNOSIS — F411 Generalized anxiety disorder: Secondary | ICD-10-CM

## 2023-11-18 DIAGNOSIS — I10 Essential (primary) hypertension: Secondary | ICD-10-CM

## 2023-11-18 DIAGNOSIS — E785 Hyperlipidemia, unspecified: Secondary | ICD-10-CM

## 2023-11-18 DIAGNOSIS — M797 Fibromyalgia: Secondary | ICD-10-CM

## 2023-11-18 DIAGNOSIS — S30861A Insect bite (nonvenomous) of abdominal wall, initial encounter: Secondary | ICD-10-CM

## 2023-11-18 DIAGNOSIS — Z713 Dietary counseling and surveillance: Secondary | ICD-10-CM

## 2023-11-18 DIAGNOSIS — E559 Vitamin D deficiency, unspecified: Secondary | ICD-10-CM

## 2023-11-18 DIAGNOSIS — F331 Major depressive disorder, recurrent, moderate: Secondary | ICD-10-CM | POA: Diagnosis not present

## 2023-11-18 DIAGNOSIS — W57XXXA Bitten or stung by nonvenomous insect and other nonvenomous arthropods, initial encounter: Secondary | ICD-10-CM

## 2023-11-18 MED ORDER — ESCITALOPRAM OXALATE 20 MG PO TABS: 20.0000 mg | ORAL_TABLET | Freq: Every day | ORAL | 0 refills | Status: DC

## 2023-11-18 MED ORDER — PHENTERMINE HCL 37.5 MG PO TABS: 37.5000 mg | ORAL_TABLET | Freq: Every day | ORAL | 2 refills | Status: DC

## 2023-11-18 MED ORDER — HYDROCHLOROTHIAZIDE 12.5 MG PO CAPS: 12.5000 mg | ORAL_CAPSULE | Freq: Every day | ORAL | 1 refills | Status: DC

## 2023-11-18 NOTE — Patient Instructions (Signed)
Tick Bite Information, Adult  Ticks are insects that draw blood for food. They climb onto people and animals that brush against the leaves and grasses that they live in. They then bite and attach to the skin. Most ticks are harmless, but some ticks may carry germs that can cause disease. These germs are spread to a person through a bite. To lower your risk of getting a disease from a tick bite, make sure you: Take steps to prevent tick bites. Check for ticks after being outdoors where ticks live. Watch for symptoms of disease if a tick attached to you or if you think a tick bit you. How can I prevent tick bites? Take these steps to help prevent tick bites when you go outdoors in an area where ticks live: Before you go outdoors: Wear long sleeves and long pants to protect your skin from ticks. Wear light-colored clothing so you can see ticks easier. Tuck your pant legs into your socks. Apply insect repellent that has DEET (20% or higher), picaridin, or IR3535 in it to the following areas: Any bare skin. Avoid areas around the eyes and mouth. Edges of clothing, like the top of your boots, the bottom of your pant legs, and your sleeve cuffs. Consider applying an insect repellant that contains permethrin. Follow the instructions on the label. Do not apply permethrin directly to the skin. Instead, apply to the following areas: Clothing and shoes. Outdoor gear and tents. When you are outdoors: Avoid walking through areas with long grass. If you are walking on a trail, stay in the middle of the trail so your skin, hair, and clothing do not touch the bushes. Check for ticks on your clothing, hair, and skin often while you are outdoors. Check again before you go inside. When you go indoors: Check your clothing for ticks. Tumble dry clothes in a dryer on high heat for at least 10 minutes. If clothes are damp, additional time may be needed. If clothes require washing, use hot water. Check your gear and  pets. Shower soon after being outdoors. Check your body for ticks. Do a full body check using a mirror. Be sure to check your scalp, neck, armpits, waist, groin, and joint areas. These are the spots where ticks attach themselves most often. What is the best way to remove a tick?  Remove the tick as soon as possible. Removing it can prevent germs from passing to your body. Do not remove the tick with your bare fingers. Do not try to remove a tick with heat, alcohol, petroleum jelly, or fingernail polish. These things can cause the tick to salivate and regurgitate into your bloodstream, increasing your risk of getting a disease. To remove a tick that is crawling on your skin: Go outside and brush the tick off. Use tape or a lint roller. To remove a tick that is attached to your skin: Wash your hands. If you have gloves, put them on. Use a fine-tipped tweezer, curved forceps, or a tick-removal tool to gently grasp the tick as close to your skin and the tick's head as possible. Gently pull with a steady, upward, and even pressure until the tick lets go. While removing the tick: Take care to keep the tick's head attached to its body. Do not twist or jerk the tick. This can make the tick's head or mouth parts break off and stay in your skin. If this happens, try to remove the mouth parts with tweezers. If you cannot remove them, leave   the area alone and let the skin heal. Do not squeeze or crush the tick's body. This could force disease-carrying fluids from the tick into your body. What should I do after removing a tick? Clean the bite area and your hands with soap and water, rubbing alcohol, or an iodine scrub. If an antiseptic cream or ointment is available, put a small amount on the bite area. Wash and disinfect any tools that you used to remove the tick. How should I dispose of a tick? To dispose of a live tick, use one of these methods: Place it in rubbing alcohol. Place it in a sealed bag  or container, and throw it away. Wrap it tightly in tape, and throw it away. Flush it down the toilet. Where to find more information Centers for Disease Control and Prevention: cdc.gov/ticks U.S. Environmental Protection Agency: epa.gov/insect-repellents Contact a health care provider if: You have symptoms of a disease after a tick bite. Symptoms of a tick-borne disease can occur from moments after the tick bites to 30 days after a tick is removed. Symptoms include: Fever or chills. A red rash that makes a circle (bull's-eye rash) in the bite area. Redness and swelling in the bite area. Headache or stiff neck. Muscle, joint, or bone pain. Abnormal tiredness. Numbness in your legs or trouble walking or moving your legs. Tender or swollen lymph glands. Abdominal pain, vomiting, diarrhea, or weight loss. Get help right away if: You are not able to remove a tick. You have muscle weakness or paralysis. Your symptoms get worse or you experience new symptoms. You find an engorged tick on your skin and you are in an area where there is a higher risk of disease from ticks. Summary Ticks may carry germs that can spread to a person through a bite. These germs can cause disease. Wear protective clothing and use insect repellent to prevent tick bites. Follow the instructions on the label. If you find a tick on your body, remove it as soon as possible. If the tick is attached, do not try to remove it with heat, alcohol, petroleum jelly, or fingernail polish. If you have symptoms of a disease after being bitten by a tick, contact a health care provider. This information is not intended to replace advice given to you by your health care provider. Make sure you discuss any questions you have with your health care provider. Document Revised: 07/20/2021 Document Reviewed: 07/20/2021 Elsevier Patient Education  2024 Elsevier Inc.  

## 2023-11-18 NOTE — Progress Notes (Signed)
 Subjective:    Patient ID: Rita Barry, female    DOB: 1965-05-28, 58 y.o.   MRN: [id number]  Chief Complaint  Patient presents with   Medical Management of Chronic Issues   PT presents to the office today for  chronic follow up. She has fibromyalgia that is stable. [place] and is [place] and states she has generalized pain in the evenings of 2 out 10.    She reports she stopped her Ultram . She is complaining of vertigo. Reports she has had this on and off for years and use to see ENT, however, he has retired. She reports over the last month she has had dizziness with bending over and looking up. She rides horses daily and this has interfered with this. She takes valium  as needed. Does not need a refill today.   Followed by Pulmonologist for cough variant asthma.    She is currently taking Wegovy  2.4  mg. She has lost 39 lbs. However, her insurance will no longer pay for it. She has gained 10 lbs and requesting medication.      11/18/2023    2:47 PM 11/01/2023    9:47 AM 08/11/2023   11:33 AM  Last 3 Weights  Weight (lbs) 171 lb 9.6 oz 168 lb 12.8 oz 171 lb  Weight (kg) 77.837 kg 76.567 kg 77.565 kg    Pt complaining of tick bite on left lower abdomen 2 weeks ago. Reports she has removed several this season. Complaining of fatigue and wants to be tested for lyme today.   Dizziness This is a chronic problem. The current episode started more than 1 year ago. The problem occurs intermittently. The problem has been waxing and waning. Associated symptoms include fatigue. The symptoms are aggravated by bending. She has tried rest and relaxation for the symptoms. The treatment provided mild relief.  Hypertension This is a chronic problem. The current episode started more than 1 year ago. The problem has been resolved since onset. The problem is controlled. Associated symptoms include anxiety and malaise/fatigue. Pertinent negatives include no peripheral edema or shortness of  breath. Risk factors for coronary artery disease include dyslipidemia. Past treatments include diuretics. The current treatment provides moderate improvement.  Anxiety Presents for follow-up visit. Symptoms include dizziness, excessive worry and nervous/anxious behavior. Patient reports no shortness of breath. Symptoms occur occasionally. The severity of symptoms is mild.    Hyperlipidemia This is a chronic problem. The current episode started more than 1 year ago. The problem is uncontrolled. Recent lipid tests were reviewed and are high. Pertinent negatives include no shortness of breath. Current antihyperlipidemic treatment includes diet change. The current treatment provides mild improvement of lipids. Risk factors for coronary artery disease include dyslipidemia, hypertension, a sedentary lifestyle and post-menopausal.  Depression        This is a chronic problem.  The current episode started more than 1 year ago.   The problem occurs intermittently.  Associated symptoms include fatigue.  Associated symptoms include no helplessness, no hopelessness and not sad.  Past treatments include SSRIs - Selective serotonin reuptake inhibitors.  Past medical history includes anxiety.       Review of Systems  Constitutional:  Positive for fatigue and malaise/fatigue.  Respiratory:  Negative for shortness of breath.   Neurological:  Positive for dizziness.  Psychiatric/Behavioral:  The patient is nervous/anxious.   All other systems reviewed and are negative.  Family History  Problem Relation Age of Onset   Cancer  Father        Throat   Hypertension Mother    Breast cancer Mother    Diabetes Maternal Grandmother    Social History   Socioeconomic History   Marital status: Married    Spouse name: Not on file   Number of children: Not on file   Years of education: Not on file   Highest education level: Bachelor's degree (e.g., BA, AB, BS)  Occupational History   Occupation: Works with  Horses  Tobacco Use   Smoking status: Never   Smokeless tobacco: Never  Vaping Use   Vaping status: Never Used  Substance and Sexual Activity   Alcohol use: Yes    Comment: 2-3 glassses of wine weekly   Drug use: No   Sexual activity: Yes  Other Topics Concern   Not on file  Social History Narrative   Not on file   Social Drivers of Health   Financial Resource Strain: Low Risk  (12/23/2022)   Overall Financial Resource Strain (CARDIA)    Difficulty of Paying Living Expenses: Not hard at all  Food Insecurity: No Food Insecurity (12/23/2022)   Hunger Vital Sign    Worried About Running Out of Food in the Last Year: Never true    Ran Out of Food in the Last Year: Never true  Transportation Needs: No Transportation Needs (12/23/2022)   PRAPARE - Administrator, Civil Service (Medical): No    Lack of Transportation (Non-Medical): No  Physical Activity: Sufficiently Active (12/23/2022)   Exercise Vital Sign    Days of Exercise per Week: 5 days    Minutes of Exercise per Session: 60 min  Stress: No Stress Concern Present (12/23/2022)   [employer] of Occupational Health - Occupational Stress Questionnaire    Feeling of Stress : Not at all  Social Connections: Unknown (12/23/2022)   Social Connection and Isolation Panel    Frequency of Communication with Friends and Family: Twice a week    Frequency of Social Gatherings with Friends and Family: Once a week    Attends Religious Services: Patient declined    Database administrator or Organizations: Yes    Attends Engineer, structural: More than 4 times per year    Marital Status: Married       Objective:   Physical Exam Vitals reviewed.  Constitutional:      General: She is not in acute distress.    Appearance: She is well-developed.  HENT:     Head: Normocephalic and atraumatic.     Right Ear: Tympanic membrane normal.     Left Ear: Tympanic membrane normal.  Eyes:     Pupils: Pupils are equal,  round, and reactive to light.  Neck:     Thyroid : No thyromegaly.  Cardiovascular:     Rate and Rhythm: Normal rate and regular rhythm.     Heart sounds: Normal heart sounds. No murmur heard. Pulmonary:     Effort: Pulmonary effort is normal. No respiratory distress.     Breath sounds: Normal breath sounds. No wheezing.  Abdominal:     General: Bowel sounds are normal. There is no distension.     Palpations: Abdomen is soft.     Tenderness: There is no abdominal tenderness.  Musculoskeletal:        General: No tenderness. Normal range of motion.     Cervical back: Normal range of motion and neck supple.  Skin:    General: Skin is warm and  dry.         Comments: Small erythemas papule, no bullseye rash   Neurological:     Mental Status: She is alert and oriented to person, place, and time.     Cranial Nerves: No cranial nerve deficit.     Deep Tendon Reflexes: Reflexes are normal and symmetric.  Psychiatric:        Behavior: Behavior normal.        Thought Content: Thought content normal.        Judgment: Judgment normal.       BP 117/71   Pulse 87   Temp (!) 96.4 F (35.8 C)   Ht 5' 7 (1.702 m)   Wt 171 lb 9.6 oz (77.8 kg)   LMP 04/16/2016 (Approximate)   SpO2 96%   BMI 26.88 kg/m      Assessment & Plan:   Rita Barry comes in today with chief complaint of Medical Management of Chronic Issues   Diagnosis and orders addressed:  1. Moderate episode of recurrent major depressive disorder (HCC) (Primary) - escitalopram  (LEXAPRO ) 20 MG tablet; Take 1 tablet (20 mg total) by mouth daily.  Dispense: 90 tablet; Refill: 0 - CMP14+EGFR  2. Fibromyalgia - CMP14+EGFR  3. GAD (generalized anxiety disorder) - escitalopram  (LEXAPRO ) 20 MG tablet; Take 1 tablet (20 mg total) by mouth daily.  Dispense: 90 tablet; Refill: 0 - CMP14+EGFR  4. Vitamin D  deficiency - CMP14+EGFR  5. Hyperlipidemia, unspecified hyperlipidemia type - CMP14+EGFR  6. Essential  hypertension - hydrochlorothiazide  (MICROZIDE ) 12.5 MG capsule; Take 1 capsule (12.5 mg total) by mouth daily.  Dispense: 90 capsule; Refill: 1 - CMP14+EGFR  7. Tick bite of abdomen, initial encounter -Pt to report any new fever, joint pain, or rash -Wear protective clothing while outside- Long sleeves and long pants -Put insect repellent on all exposed skin and along clothing -Take a shower as soon as possible after being outside Follow up if symptoms worsen or do not improve  - CMP14+EGFR - Lyme Disease Serology w/Reflex   8. Weight loss counseling, encounter for Start phentermine   Encouraged healthy diet and exercise  - phentermine  (ADIPEX-P ) 37.5 MG tablet; Take 1 tablet (37.5 mg total) by mouth daily before breakfast.  Dispense: 30 tablet; Refill: 2   Labs pending PT reviewed in [place] controlled database, no red flags  Pt still has Valium  2 mg and Antivert  at home. No refills today.  Health Maintenance reviewed Diet and exercise encouraged  Follow up plan: 6 months    Bari Learn, FNP

## 2023-11-21 ENCOUNTER — Ambulatory Visit: Payer: Self-pay | Admitting: Family

## 2023-11-21 ENCOUNTER — Other Ambulatory Visit: Payer: Self-pay | Admitting: Family

## 2023-11-21 LAB — CMP14+EGFR
ALT: 48 IU/L — ABNORMAL HIGH (ref 0–32)
AST: 50 IU/L — ABNORMAL HIGH (ref 0–40)
Albumin: 3.9 g/dL (ref 3.8–4.9)
Alkaline Phosphatase: 30 IU/L — ABNORMAL LOW (ref 44–121)
BUN/Creatinine Ratio: 21 (ref 9–23)
BUN: 14 mg/dL (ref 6–24)
Bilirubin Total: 0.4 mg/dL (ref 0.0–1.2)
CO2: 24 mmol/L (ref 20–29)
Calcium: 9 mg/dL (ref 8.7–10.2)
Chloride: 101 mmol/L (ref 96–106)
Creatinine, Ser: 0.68 mg/dL (ref 0.57–1.00)
Globulin, Total: 2.2 g/dL (ref 1.5–4.5)
Glucose: 131 mg/dL — ABNORMAL HIGH (ref 70–99)
Potassium: 4 mmol/L (ref 3.5–5.2)
Sodium: 140 mmol/L (ref 134–144)
Total Protein: 6.1 g/dL (ref 6.0–8.5)
eGFR: 101 mL/min/1.73 (ref >59)

## 2023-11-21 LAB — LYME DISEASE SEROLOGY W/REFLEX

## 2023-11-21 NOTE — Telephone Encounter (Signed)
 I spoke to pt and reviewed results with pt per result notes and pt voiced understanding. Rita Barry has already taken to lab add on form to the lab for the A1C.

## 2023-12-11 NOTE — Progress Notes (Signed)
   Rita Barry, female    DOB: 1965/08/26    MRN: [id number]   Brief patient profile:  75  yowf never smoker from [place]  developed allergic rhinitis with cough no wheeze or need for inhalers and better p got off Darci  moved to [facility] 2003 with cough ? Better with albuterol  briefly  referred to pulmonary clinic in [place]  11/01/2023 by Ronal Lunger  for persistent cough p RSV dx 08/07/23 using alb 2 puffs each.   Pt not previously seen by PCCM service.    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   Dyspnea:  *** Cough: *** Sleeping: ***   resp cc  SABA use: *** 02: ***  Lung cancer screening: ***   No obvious day to day or daytime variability or assoc excess/ purulent sputum or mucus plugs or hemoptysis or cp or chest tightness, subjective wheeze or overt sinus or hb symptoms.    Also denies any obvious fluctuation of symptoms with weather or environmental changes or other aggravating or alleviating factors except as outlined above   No unusual exposure hx or h/o childhood pna/ asthma or knowledge of premature birth.  Current Allergies, Complete Past Medical History, Past Surgical History, Family History, and Social History were reviewed in [employer] record.  ROS  The following are not active complaints unless bolded Hoarseness, sore throat, dysphagia, dental problems, itching, sneezing,  nasal congestion or discharge of excess mucus or purulent secretions, ear ache,   fever, chills, sweats, unintended wt loss or wt gain, classically pleuritic  or exertional cp,  orthopnea pnd or arm/hand swelling  or leg swelling, presyncope, palpitations, abdominal pain, anorexia, nausea, vomiting, diarrhea  or change in bowel habits or change in bladder habits, change in stools or change in urine, dysuria, hematuria,  rash, arthralgias, visual complaints, headache, numbness, weakness or ataxia or problems with walking or coordination,  change in mood or  memory.        No outpatient medications have been marked as taking for the 12/13/23 encounter (Appointment) with Darlean Ozell NOVAK, MD.            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

## 2023-12-13 ENCOUNTER — Ambulatory Visit: Admitting: Internal Medicine

## 2023-12-13 ENCOUNTER — Encounter: Payer: Self-pay | Admitting: Internal Medicine

## 2023-12-13 VITALS — BP 125/72 | HR 82 | Ht 67.0 in | Wt 171.4 lb

## 2023-12-13 DIAGNOSIS — Z23 Encounter for immunization: Secondary | ICD-10-CM | POA: Diagnosis not present

## 2023-12-13 DIAGNOSIS — J45991 Cough variant asthma: Secondary | ICD-10-CM

## 2023-12-13 NOTE — Assessment & Plan Note (Addendum)
 Onset p moved to [place] around 2003 on background of probable allergic rhinitis  11/01/2023  After extensive coaching inhaler device,  effectiveness =    80% (trigger at onset of insp) > try dulera 100 2 each am and then pm doses prn  - Allergy screen 11/01/2023 >  Eos 1.4  /  IgE  273 > refer to allergy  - 12/13/2023  After extensive coaching inhaler device,  effectiveness =   90%   All goals of chronic asthma control met including optimal function and elimination of symptoms with minimal need for rescue therapy.  Contingencies discussed in full including contacting this office immediately if not controlling the symptoms using the rule of two's.     Rec continue dulera 100 2 each am with option of 2 bid for any flare of symptoms or increased need for saba using the rule of two/ reviewed  F/u with allergy as planned and here prn  Discussed in detail all the  indications, usual  risks and alternatives  relative to the benefits with patient who agrees to proceed with Rx as outlined.             Each maintenance medication was reviewed in detail including emphasizing most importantly the difference between maintenance and prns and under what circumstances the prns are to be triggered using an action plan format where appropriate.  Total time for H and P, chart review, counseling, reviewing hfa device(s) and generating customized AVS unique to this office visit / same day charting = 30 min final summary f/u ov

## 2023-12-13 NOTE — Patient Instructions (Signed)
 If you are satisfied with your treatment plan,  let your doctor know and he/she can either refill your medications or you can return here when your prescription runs out.     If in any way you are not 100% satisfied,  please tell us .  If 100% better, tell your friends!  Pulmonary follow up is as needed

## 2023-12-23 ENCOUNTER — Ambulatory Visit: Admitting: Family

## 2024-01-04 ENCOUNTER — Ambulatory Visit: Admitting: Allergy & Immunology

## 2024-01-04 ENCOUNTER — Other Ambulatory Visit: Payer: Self-pay

## 2024-01-04 ENCOUNTER — Encounter: Payer: Self-pay | Admitting: Allergy & Immunology

## 2024-01-04 VITALS — BP 130/80 | HR 87 | Temp 97.7°F | Resp 18 | Ht 66.14 in | Wt 167.6 lb

## 2024-01-04 DIAGNOSIS — D721 Eosinophilia, unspecified: Secondary | ICD-10-CM

## 2024-01-04 DIAGNOSIS — J45991 Cough variant asthma: Secondary | ICD-10-CM | POA: Diagnosis not present

## 2024-01-04 DIAGNOSIS — J310 Chronic rhinitis: Secondary | ICD-10-CM

## 2024-01-04 NOTE — Patient Instructions (Addendum)
 1. Cough variant asthma - Lung testing looked excellent today. - We are not going to make any changes since Dr. Darlean is taking care of you.   2. Chronic rhinitis - Because of insurance stipulations, we cannot do skin testing on the same day as your first visit. - We are all working to fight this, but for now we need to do two separate visits.  - We will know more after we do testing at the next visit.  - The skin testing visit can be squeezed in at your convenience.  - Then we can make a more full plan to address all of your symptoms. - Be sure to stop your antihistamines for 3 days before this appointment.   3. Eosinophilia - unknown cause - The most likely diagnosis with common things being common is uncontrolled allergies. - This is where the prick testing is going to come into play. - But we are going to get some labs to look for weird causes of elevated eosinophils, including parasites and autoimmune conditions.  - Labs ordered to look for parasites, autoimmune disease, and blood cancers (although I HIGHLY doubt this is the case). - We also ordered an ova and parasite test for your stool, given your intermittent diarrhea and exposure to multiple animals.  - Basically we are just trying to rule out causes of the elevated eosinophils.  - There is more testing we COULD do, but it involves genetic testing and I do not know how much it would be AND I do not think that your eosinophil count is high enough to make me concerned with these very rare syndromes.  4. Return in about 2 weeks (around 01/18/2024) for SKIN TESTING (1-55). You can have the follow up appointment with Dr. Iva or a Nurse Practicioner (our Nurse Practitioners are excellent and always have Physician oversight!).    Please inform us  of any Emergency Department visits, hospitalizations, or changes in symptoms. Call us  before going to the ED for breathing or allergy symptoms since we might be able to fit you in for a sick  visit. Feel free to contact us  anytime with any questions, problems, or concerns.  It was a pleasure to meet you and your hubby today!  Websites that have reliable patient information: 1. American Academy of Asthma, Allergy, and Immunology: www.aaaai.org 2. Food Allergy Research and Education (FARE): foodallergy.org 3. Mothers of Asthmatics: http://www.asthmacommunitynetwork.org 4. American College of Allergy, Asthma, and Immunology: www.acaai.org      "Like" us  on Facebook and Instagram for our latest updates!      A healthy democracy works best when [employer] participate! Make sure you are registered to vote! If you have moved or changed any of your contact information, you will need to get this updated before voting! Scan the QR codes below to learn more!

## 2024-01-04 NOTE — Progress Notes (Signed)
 NEW PATIENT  Date of Service/Encounter:  01/04/24  Consult requested by: Lavell Bari LABOR, FNP   Assessment:   Cough variant asthma   Chronic rhinitis - planning for skin testing at the next visit  Eosinophilia - unknown cause  Plan/Recommendations:   1. Cough variant asthma - Lung testing looked excellent today. - We are not going to make any changes since Dr. Darlean is taking care of you.   2. Chronic rhinitis - Because of insurance stipulations, we cannot do skin testing on the same day as your first visit. - We are all working to fight this, but for now we need to do two separate visits.  - We will know more after we do testing at the next visit.  - The skin testing visit can be squeezed in at your convenience.  - Then we can make a more full plan to address all of your symptoms. - Be sure to stop your antihistamines for 3 days before this appointment.   3. Eosinophilia - unknown cause - The most likely diagnosis with common things being common is uncontrolled allergies. - This is where the prick testing is going to come into play. - But we are going to get some labs to look for weird causes of elevated eosinophils, including parasites and autoimmune conditions.  - Labs ordered to look for parasites, autoimmune disease, and blood cancers (although I HIGHLY doubt this is the case). - We also ordered an ova and parasite test for your stool, given your intermittent diarrhea and exposure to multiple animals.  - Basically we are just trying to rule out causes of the elevated eosinophils.  - There is more testing we COULD do, but it involves genetic testing and I do not know how much it would be AND I do not think that your eosinophil count is high enough to make me concerned with these very rare syndromes.  4. Return in about 2 weeks (around 01/18/2024) for SKIN TESTING (1-55). You can have the follow up appointment with Dr. Iva or a Nurse Practicioner (our Nurse  Practitioners are excellent and always have Physician oversight!).    This note in its entirety was forwarded to the Provider who requested this consultation.  Subjective:   Rita Barry is a 58 y.o. 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  Discussed the use of AI scribe software for clinical note transcription with the patient and/or guardian, who gave verbal consent to proceed.  Rita  P Barry was referred by Lavell Bari LABOR, FNP.     Rita Barry  is a 58 y.o. female presenting for an evaluation of eosinophilia.  She experiences a persistent cough primarily during allergic reactions, without associated breathing difficulties.  She uses Dulera, two puffs in the morning, but does not notice any improvement. Her eosinophil count has been elevated since March, reaching 1400, significantly above the normal range. This elevation was first noted in April and has persisted.  But looking back, she had elevated eosinophils up to 1400 around 2 years ago.  She has a history of RSV infection in April, which led to a pulmonologist consultation. A chest x-ray performed in the ER at that time was clear. She has not  experienced fevers or joint pains, although she mentions starting to have joint pains recently.  She has been tested for eosinophil levels regularly, with consistently high results, but no other significant health changes have been noted. 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 She occasionally experiences diarrhea and has been taking Wegovy , although she is unsure if it affects her symptoms.   She denies any history of parasites, but she is around her horses and dogs constantly.  She has some intermittent diarrhea, but nothing too terrible and no blood in the stool.  She is up-to-date on her mammograms and her colonoscopies.  She gets regular appointments by her PCP.  Asthma/Respiratory Symptom History: It looks like she is seen by Dr. Darlean. At his last visit in mid August 2025,ert.  She was using her inhaler around 80% of the time.  She was continued on Dulera 100 mcg 2 puffs twice daily during flares.  She had RSV in April 2025 and she seemed to have worsening symptoms after that.   Allergic Rhinitis Symptom History: She experiences sneezing and itchy, watery eyes, which are not new symptoms. She uses fluticasone  once in the morning for these symptoms and reports severe nasal congestion, especially at night.  She has never been allergy tested before.   Her social history includes frequent outdoor work, exposure to animals such as horses and dogs, and living on a farm. She has a history of working as a Psychologist, forensic, including at [employer], where she first noticed symptoms of watery eyes and breathing difficulties. She has never smoked.  Otherwise, there is no history of other atopic diseases, including drug allergies, stinging insect allergies, or contact dermatitis. There is no significant infectious history. Vaccinations are up to date.    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 Has patient had a PCN reaction causing immediate rash, facial/tongue/throat swelling, SOB or lightheadedness with hypotension: Yes Has patient had a PCN reaction causing severe rash involving mucus membranes or skin necrosis: No Has patient had a PCN reaction that required hospitalization: No Has patient had a PCN reaction occurring within the last 10 years: No If all of the above answers are NO, then may proceed with Cephalos        Medication List  Accurate as of January 04, 2024  1:13 PM. If you have any questions, ask your nurse or doctor.          albuterol  108 (90 Base) MCG/ACT inhaler Commonly known as: VENTOLIN  HFA Inhale 2 puffs into the lungs every 6 (six) hours as needed for wheezing or shortness of breath.   diazepam  2 MG tablet Commonly known as: Valium  Take 1 tablet (2 mg total) by mouth every 12 (twelve) hours as needed for anxiety.   escitalopram  20 MG tablet Commonly known as: LEXAPRO  Take 1 tablet (20 mg total) by mouth daily.   hydrochlorothiazide  12.5 MG capsule Commonly known as: MICROZIDE  Take 1 capsule (12.5 mg total) by mouth daily.   Meclizine  HCl 25 MG Chew Commonly known as: Antivert  Chew 1 tablet (25 mg total) by mouth every 8 (eight) hours.   mometasone -formoterol  100-5 MCG/ACT Aero Commonly known as: DULERA Take 2 puffs first  thing in am and then another 2 puffs about 12 hours later.   phentermine  37.5 MG tablet Commonly known as: ADIPEX-P  Take 1 tablet (37.5 mg total) by mouth daily before breakfast.   WOMENS MULTI VITAMIN & MINERAL PO Take 1 tablet by mouth daily.        Birth History: non-contributory  Developmental History: non-contributory  Past Surgical History: Past Surgical History:  Procedure Laterality Date   COLONOSCOPY N/A 09/01/2015   Procedure: COLONOSCOPY;  Surgeon: Margo LITTIE Haddock, MD;  Location: [facility];  Service: Endoscopy;  Laterality: N/A;  9:45 AM   ESOPHAGOGASTRODUODENOSCOPY N/A 06/04/2016   Procedure: ESOPHAGOGASTRODUODENOSCOPY (EGD);  Surgeon: Margo LITTIE Haddock, MD;  Location: [facility];  Service: Endoscopy;  Laterality: N/A;  10:30 am   FINGER SURGERY     FRACTURE SURGERY  1978   SAVORY DILATION N/A 06/04/2016   Procedure: SAVORY DILATION;  Surgeon: Margo LITTIE Haddock, MD;  Location: [facility];  Service: Endoscopy;  Laterality: N/A;   SHOULDER SURGERY     Left   SHOULDER SURGERY Left    x2 surgeries     Family History: Family History  Problem Relation Age of Onset   Cancer Father        Throat   Hypertension Mother    Breast cancer Mother    Cancer Mother    Diabetes Maternal Grandmother    Arthritis Maternal Grandmother      Social History: [place]  lives at home with her husband.  They live in a house that is 74 years old.  There is laminate throughout the home.  They have a heat pump for heating and central cooling.  There are 2 dogs inside of the home.  There are cats, horses, and chickens outside of the home.  There are no dust mite covers on the bedding.  There is no tobacco exposure.  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  She is around a lot of dust.  They do not live near an interstate or industrial area.  There is no.   Review of systems otherwise negative other than that mentioned in the HPI.    Objective:   Blood  pressure 130/80, pulse 87, temperature 97.7 F (36.5 C), temperature source Temporal, resp. rate 18, height 5' 6.14 (1.68 m), weight 167 lb 9.6 oz (76 kg), last menstrual period 04/16/2016, SpO2 97%. Body mass index is 26.94 kg/m.     Physical Exam Vitals reviewed.  Constitutional:      Appearance: She is well-developed.  HENT:  Head: Normocephalic and atraumatic.     Right Ear: Tympanic membrane, ear canal and external ear normal. No drainage, swelling or tenderness. Tympanic membrane is not injected, scarred, erythematous, retracted or bulging.     Left Ear: Tympanic membrane, ear canal and external ear normal. No drainage, swelling or tenderness. Tympanic membrane is not injected, scarred, erythematous, retracted or bulging.     Nose: No nasal deformity, septal deviation, mucosal edema or rhinorrhea.     Right Turbinates: Enlarged, swollen and pale.     Left Turbinates: Enlarged, swollen and pale.     Right Sinus: No maxillary sinus tenderness or frontal sinus tenderness.     Left Sinus: No maxillary sinus tenderness or frontal sinus tenderness.     Comments: No polyps.    Mouth/Throat:     Mouth: Mucous membranes are not pale and not dry.     Pharynx: Uvula midline.  Eyes:     General: Lids are normal. Allergic shiner present.        Right eye: No discharge.        Left eye: No discharge.     Conjunctiva/sclera: Conjunctivae normal.     Right eye: Right conjunctiva is not injected. No chemosis.    Left eye: Left conjunctiva is not injected. No chemosis.    Pupils: Pupils are equal, round, and reactive to light.  Cardiovascular:     Rate and Rhythm: Normal rate and regular rhythm.     Heart sounds: Normal heart sounds.  Pulmonary:     Effort: Pulmonary effort is normal. No tachypnea, accessory muscle usage or respiratory distress.     Breath sounds: Normal breath sounds. No wheezing, rhonchi or rales.     Comments: Moving air well in all lung fields.  No increased work  of breathing. Chest:     Chest wall: No tenderness.  Abdominal:     Tenderness: There is no abdominal tenderness. There is no guarding or rebound.  Lymphadenopathy:     Head:     Right side of head: No submandibular, tonsillar or occipital adenopathy.     Left side of head: No submandibular, tonsillar or occipital adenopathy.     Cervical: No cervical adenopathy.  Skin:    General: Skin is warm.     Capillary Refill: Capillary refill takes less than 2 seconds.     Coloration: Skin is not pale.     Findings: No abrasion, erythema, petechiae or rash. Rash is not papular, urticarial or vesicular.     Comments: No eczematous or urticarial lesions noted.  Neurological:     Mental Status: She is alert.  Psychiatric:        Behavior: Behavior is cooperative.      Diagnostic studies:    Spirometry: results normal (FEV1: 3.23/120%, FVC: 3.77/110%, FEV1/FVC: 86%).    Spirometry consistent with normal pattern.    Allergy Studies: labs sent instead           Marty Shaggy, MD [facility] 

## 2024-01-11 LAB — CBC WITH DIFFERENTIAL/PLATELET
Basophils Absolute: 0.1 x10E3/uL (ref 0.0–0.2)
Basos: 1 %
EOS (ABSOLUTE): 1 x10E3/uL — ABNORMAL HIGH (ref 0.0–0.4)
Eos: 18 %
Hematocrit: 40.1 % (ref 34.0–46.6)
Hemoglobin: 13.2 g/dL (ref 11.1–15.9)
Immature Grans (Abs): 0 x10E3/uL (ref 0.0–0.1)
Immature Granulocytes: 0 %
Lymphocytes Absolute: 1.3 x10E3/uL (ref 0.7–3.1)
Lymphs: 23 %
MCH: 33.5 pg — ABNORMAL HIGH (ref 26.6–33.0)
MCHC: 32.9 g/dL (ref 31.5–35.7)
MCV: 102 fL — ABNORMAL HIGH (ref 79–97)
Monocytes Absolute: 0.3 x10E3/uL (ref 0.1–0.9)
Monocytes: 6 %
Neutrophils Absolute: 3 x10E3/uL (ref 1.4–7.0)
Neutrophils: 52 %
Platelets: 372 x10E3/uL (ref 150–450)
RBC: 3.94 x10E6/uL (ref 3.77–5.28)
RDW: 12.1 % (ref 11.7–15.4)
WBC: 5.7 x10E3/uL (ref 3.4–10.8)

## 2024-01-11 LAB — CMP14+EGFR
ALT: 30 IU/L (ref 0–32)
AST: 38 IU/L (ref 0–40)
Albumin: 4.3 g/dL (ref 3.8–4.9)
Alkaline Phosphatase: 34 IU/L — ABNORMAL LOW (ref 44–121)
BUN/Creatinine Ratio: 28 — ABNORMAL HIGH (ref 9–23)
BUN: 19 mg/dL (ref 6–24)
Bilirubin Total: 0.6 mg/dL (ref 0.0–1.2)
CO2: 22 mmol/L (ref 20–29)
Calcium: 9.6 mg/dL (ref 8.7–10.2)
Chloride: 99 mmol/L (ref 96–106)
Creatinine, Ser: 0.67 mg/dL (ref 0.57–1.00)
Globulin, Total: 2.2 g/dL (ref 1.5–4.5)
Glucose: 97 mg/dL (ref 70–99)
Potassium: 4.4 mmol/L (ref 3.5–5.2)
Sodium: 141 mmol/L (ref 134–144)
Total Protein: 6.5 g/dL (ref 6.0–8.5)
eGFR: 101 mL/min/1.73 (ref >59)

## 2024-01-11 LAB — ANCA TITERS
Atypical pANCA: <1:20 {titer}
C-ANCA: <1:20 {titer}
P-ANCA: <1:20 {titer}

## 2024-01-11 LAB — ANTINUCLEAR ANTIBODIES, IFA: ANA Titer 1: NEGATIVE

## 2024-01-11 LAB — PROTEIN ELECTROPHORESIS, SERUM, WITH REFLEX
A/G Ratio: 1.4 (ref 0.7–1.7)
Albumin ELP: 3.8 g/dL (ref 2.9–4.4)
Alpha 1: 0.2 g/dL (ref 0.0–0.4)
Alpha 2: 0.6 g/dL (ref 0.4–1.0)
Beta: 1 g/dL (ref 0.7–1.3)
Gamma Globulin: 0.9 g/dL (ref 0.4–1.8)
Globulin, Total: 2.7 g/dL (ref 2.2–3.9)

## 2024-01-11 LAB — TOXOCARA AB, IGG, S: Toxocara Ab, IgG, S: NEGATIVE

## 2024-01-11 LAB — STRONGYLOIDES, AB, IGG: Strongyloides, Ab, IgG: NEGATIVE

## 2024-01-11 LAB — SEDIMENTATION RATE: Sed Rate: 5 mm/h (ref 0–40)

## 2024-01-11 LAB — C-REACTIVE PROTEIN: CRP: 2 mg/L (ref 0–10)

## 2024-01-12 DIAGNOSIS — D721 Eosinophilia, unspecified: Secondary | ICD-10-CM | POA: Diagnosis not present

## 2024-01-13 LAB — UPEP/TP, 24-HR URINE

## 2024-01-15 ENCOUNTER — Ambulatory Visit: Payer: Self-pay | Admitting: Allergy & Immunology

## 2024-01-17 LAB — UPEP/TP, 24-HR URINE
Albumin, U: 100 %
Alpha 1, Urine: 0 %
Alpha 2, Urine: 0 %
Beta, Urine: 0 %
Gamma Globulin, Urine: 0 %
Protein, 24H Urine: 222 mg/(24.h) — ABNORMAL HIGH (ref 30–150)
Protein, Ur: 7.4 mg/dL

## 2024-01-18 ENCOUNTER — Ambulatory Visit: Admitting: Allergy & Immunology

## 2024-01-18 ENCOUNTER — Encounter: Payer: Self-pay | Admitting: Allergy & Immunology

## 2024-01-18 ENCOUNTER — Other Ambulatory Visit: Payer: Self-pay | Admitting: Allergy & Immunology

## 2024-01-18 DIAGNOSIS — J302 Other seasonal allergic rhinitis: Secondary | ICD-10-CM

## 2024-01-18 DIAGNOSIS — D721 Eosinophilia, unspecified: Secondary | ICD-10-CM | POA: Diagnosis not present

## 2024-01-18 DIAGNOSIS — J3089 Other allergic rhinitis: Secondary | ICD-10-CM | POA: Diagnosis not present

## 2024-01-18 MED ORDER — FLUTICASONE PROPIONATE 50 MCG/ACT NA SUSP: 1.0000 | Freq: Every day | NASAL | 3 refills | Status: DC

## 2024-01-18 MED ORDER — LEVOCETIRIZINE DIHYDROCHLORIDE 5 MG PO TABS: 5.0000 mg | ORAL_TABLET | Freq: Every evening | ORAL | 3 refills | Status: AC

## 2024-01-18 NOTE — Patient Instructions (Addendum)
 1. Cough variant asthma - Lung testing looked excellent today. - We are not going to make any changes since Dr. Darlean is taking care of you.   2. Perennial and seasonal allergic rhinitis - with poor perception of symptoms - Testing today showed: grasses, weeds, indoor molds, outdoor molds, dust mites, cat, dog, cockroach, and horse - Copy of test results provided.  - Avoidance measures provided. - Start taking: Xyzal  (levocetirizine) 5mg  tablet once daily and Flonase  (fluticasone ) one spray per nostril daily (AIM FOR EAR ON EACH SIDE) - You can use an extra dose of the antihistamine, if needed, for breakthrough symptoms.  - Consider nasal saline rinses 1-2 times daily to remove allergens from the nasal cavities as well as help with mucous clearance (this is especially helpful to do before the nasal sprays are given) - Consider allergy  shots as a means of long-term control. - Allergy  shots re-train and reset the immune system to ignore environmental allergens and decrease the resulting immune response to those allergens (sneezing, itchy watery eyes, runny nose, nasal congestion, etc).    - Allergy  shots improve symptoms in 75-85% of patients.  - We can discuss more at the next appointment if the medications are not working for you.  3. Eosinophilia - unknown cause - Thankfully all your workup thus far has been normal. - We are going to get a few more labs to make sure that eosinophils have been invaded other organs.  - Testing  was positive to multiple indoor and outdoor allergens, so I think that this is still the most likely cause of your elevated eosinophils. - The elevated protein in your blood is likely from your benign hematuria (blood in urine).  - We can recheck that at some point in the ne when we recheck your eosinophil level.xt 3 months   4. Return in about 3 months (around 04/18/2024). You can have the follow up appointment with Dr. Iva or a Nurse Practicioner (our Nurse  Practitioners are excellent and always have Physician oversight!).    Please inform us  of any Emergency Department visits, hospitalizations, or changes in symptoms. Call us  before going to the ED for breathing or allergy  symptoms since we might be able to fit you in for a sick visit. Feel free to contact us  anytime with any questions, problems, or concerns.  It was a pleasure to meet you and your hubby today!  Websites that have reliable patient information: 1. American Academy of Asthma, Allergy , and Immunology: www.aaaai.org 2. Food Allergy  Research and Education (FARE): foodallergy.org 3. Mothers of Asthmatics: http://www.asthmacommunitynetwork.org 4. American College of Allergy , Asthma, and Immunology: www.acaai.org      "Like" us  on Facebook and Instagram for our latest updates!      A healthy democracy works best when [employer] participate! Make sure you are registered to vote! If you have moved or changed any of your contact information, you will need to get this updated before voting! Scan the QR codes below to learn more!      Airborne Adult Perc - 01/18/24 1000     Time Antigen Placed 9088    Allergen Manufacturer Jestine    Location Back    Number of Test -54    1. Control-Buffer 50% Glycerol Negative    2. Control-Histamine 2+    3. Bahia Negative    4. [place] 4+    5. Johnson Negative    6. [place]  Blue Negative    7. Meadow Fescue Negative  8. Perennial Rye 3+    9. Timothy 4+    10. Ragweed Mix Negative    11. Cocklebur Negative    12. Plantain,  English 2+    13. Baccharis Negative    14. Dog Fennel Negative    15. Russian Thistle Negative    16. Lamb's Quarters Negative    17. Sheep Sorrell Negative    18. Rough Pigweed Negative    19. Marsh Elder, Rough Negative    20. Mugwort, Common Negative    21. Box, Elder Negative    22. Cedar, red Negative    23. Sweet Gum Negative    24. Pecan Pollen Negative    25. Pine Mix Negative    26. Walnut,  Black Pollen Negative    27. Red Mulberry Negative    28. Ash Mix Negative    29. Birch Mix Negative    30. Beech American Negative    31. Cottonwood, Guinea[place] Negative    32. Hickory, White Negative    33. Maple Mix Negative    34. Oak, [place] Mix Negative    35. Sycamore Eastern Negative    36. Alternaria Alternata Negative    37. Cladosporium Herbarum Negative    38. Aspergillus Mix Negative    39. Penicillium Mix Negative    40. Bipolaris Sorokiniana (Helminthosporium) Negative    41. Drechslera Spicifera (Curvularia) Negative    42. Mucor Plumbeus Negative    43. Fusarium Moniliforme Negative    44. Aureobasidium Pullulans (pullulara) Negative    45. Rhizopus Oryzae Negative    46. Botrytis Cinera Negative    47. Epicoccum Nigrum Negative    48. Phoma Betae Negative    49. Dust Mite Mix Negative    50. Cat Hair 10,000 BAU/ml Negative    51.  Dog Epithelia Negative    52. Mixed Feathers Negative    53. Horse Epithelia 2+    54. Cockroach, German 2+    55. Tobacco Leaf Negative          Intradermal - 01/18/24 0932     Time Antigen Placed 0945    Allergen Manufacturer Jestine    Location Arm    Number of Test 13    Control Negative    Bahia 4+    Johnson 3+    Ragweed Mix Negative    Weed Mix Negative    Tree Mix Negative    Mold 1 Negative    Mold 2 3+    Mold 3 3+    Mold 4 2+    Mite Mix 3+    Cat 1+    Dog 3+          Reducing Pollen Exposure  The American Academy of Allergy , Asthma and Immunology suggests the following steps to reduce your exposure to pollen during allergy  seasons.    Do not hang sheets or clothing out to dry; pollen may collect on these items. Do not mow lawns or spend time around freshly cut grass; mowing stirs up pollen. Keep windows closed at night.  Keep car windows closed while driving. Minimize morning activities outdoors, a time when pollen counts are usually at their highest. Stay indoors as much as possible when pollen  counts or humidity is high and on windy days when pollen tends to remain in the air longer. Use air conditioning when possible.  Many air conditioners have filters that trap the pollen spores. Use a HEPA room air filter to remove pollen form  the indoor air you breathe.  Control of Mold Allergen   Mold and fungi can grow on a variety of surfaces provided certain temperature and moisture conditions exist.  Outdoor molds grow on plants, decaying vegetation and soil.  The major outdoor mold, Alternaria and Cladosporium, are found in very high numbers during hot and dry conditions.  Generally, a late Summer - Fall peak is seen for common outdoor fungal spores.  Rain will temporarily lower outdoor mold spore count, but counts rise rapidly when the rainy period ends.  The most important indoor molds are Aspergillus and Penicillium.  Dark, humid and poorly ventilated basements are ideal sites for mold growth.  The next most common sites of mold growth are the bathroom and the kitchen.  Outdoor (Seasonal) Mold Control  Positive outdoor molds via skin testing: Bipolaris (Helminthsporium), Drechslera (Curvalaria), and Mucor  Use air conditioning and keep windows closed Avoid exposure to decaying vegetation. Avoid leaf raking. Avoid grain handling. Consider wearing a face mask if working in moldy areas.    Indoor (Perennial) Mold Control   Positive indoor molds via skin testing: Aspergillus, Penicillium, Fusarium, Aureobasidium (Pullulara), and Rhizopus  Maintain humidity below 50%. Clean washable surfaces with 5% bleach solution. Remove sources e.g. contaminated carpets.    Control of Dog or Cat Allergen  Avoidance is the best way to manage a dog or cat allergy . If you have a dog or cat and are allergic to dog or cats, consider removing the dog or cat from the home. If you have a dog or cat but don't want to find it a new home, or if your family wants a pet even though someone in the household  is allergic, here are some strategies that may help keep symptoms at bay:  Keep the pet out of your bedroom and restrict it to only a few rooms. Be advised that keeping the dog or cat in only one room will not limit the allergens to that room. Don't pet, hug or kiss the dog or cat; if you do, wash your hands with soap and water . High-efficiency particulate air (HEPA) cleaners run continuously in a bedroom or living room can reduce allergen levels over time. Regular use of a high-efficiency vacuum cleaner or a central vacuum can reduce allergen levels. Giving your dog or cat a bath at least once a week can reduce airborne allergen.  Control of Dust Mite Allergen    Dust mites play a major role in allergic asthma and rhinitis.  They occur in environments with high humidity wherever human skin is found.  Dust mites absorb humidity from the atmosphere (ie, they do not drink) and feed on organic matter (including shed human and animal skin).  Dust mites are a microscopic type of insect that you cannot see with the naked eye.  High levels of dust mites have been detected from mattresses, pillows, carpets, upholstered furniture, bed covers, clothes, soft toys and any woven material.  The principal allergen of the dust mite is found in its feces.  A gram of dust may contain 1,000 mites and 250,000 fecal particles.  Mite antigen is easily measured in the air during house cleaning activities.  Dust mites do not bite and do not cause harm to humans, other than by triggering allergies/asthma.    Ways to decrease your exposure to dust mites in your home:  Encase mattresses, box springs and pillows with a mite-impermeable barrier or cover   Wash sheets, blankets and drapes weekly in hot  water  (130 F) with detergent and dry them in a dryer on the hot setting.  Have the room cleaned frequently with a vacuum cleaner and a damp dust-mop.  For carpeting or rugs, vacuuming with a vacuum cleaner equipped with a  high-efficiency particulate air (HEPA) filter.  The dust mite allergic individual should not be in a room which is being cleaned and should wait 1 hour after cleaning before going into the room. Do not sleep on upholstered furniture (eg, couches).   If possible removing carpeting, upholstered furniture and drapery from the home is ideal.  Horizontal blinds should be eliminated in the rooms where the person spends the most time (bedroom, study, television room).  Washable vinyl, roller-type shades are optimal. Remove all non-washable stuffed toys from the bedroom.  Wash stuffed toys weekly like sheets and blankets above.   Reduce indoor humidity to less than 50%.  Inexpensive humidity monitors can be purchased at most hardware stores.  Do not use a humidifier as can make the problem worse and are not recommended.  Allergy  Shots  Allergies are the result of a chain reaction that starts in the immune system. Your immune system controls how your body defends itself. For instance, if you have an allergy  to pollen, your immune system identifies pollen as an invader or allergen. Your immune system overreacts by producing antibodies called Immunoglobulin E (IgE). These antibodies travel to cells that release chemicals, causing an allergic reaction.  The concept behind allergy  immunotherapy, whether it is received in the form of shots or tablets, is that the immune system can be desensitized to specific allergens that trigger allergy  symptoms. Although it requires time and patience, the payback can be long-term relief. Allergy  injections contain a dilute solution of those substances that you are allergic to based upon your skin testing and allergy  history.   How Do Allergy  Shots Work?  Allergy  shots work much like a vaccine. Your body responds to injected amounts of a particular allergen given in increasing doses, eventually developing a resistance and tolerance to it. Allergy  shots can lead to decreased,  minimal or no allergy  symptoms.  There generally are two phases: build-up and maintenance. Build-up often ranges from three to six months and involves receiving injections with increasing amounts of the allergens. The shots are typically given once or twice a week, though more rapid build-up schedules are sometimes used.  The maintenance phase begins when the most effective dose is reached. This dose is different for each person, depending on how allergic you are and your response to the build-up injections. Once the maintenance dose is reached, there are longer periods between injections, typically two to four weeks.  Occasionally doctors give cortisone-type shots that can temporarily reduce allergy  symptoms. These types of shots are different and should not be confused with allergy  immunotherapy shots.  Who Can Be Treated with Allergy  Shots?  Allergy  shots may be a good treatment approach for people with allergic rhinitis (hay fever), allergic asthma, conjunctivitis (eye allergy ) or stinging insect allergy .   Before deciding to begin allergy  shots, you should consider:   The length of allergy  season and the severity of your symptoms  Whether medications and/or changes to your environment can control your symptoms  Your desire to avoid long-term medication use  Time: allergy  immunotherapy requires a major time commitment  Cost: may vary depending on your insurance coverage  Allergy  shots for children age 79 and older are effective and often well tolerated. They might prevent the onset  of new allergen sensitivities or the progression to asthma.  Allergy  shots are not started on patients who are pregnant but can be continued on patients who become pregnant while receiving them. In some patients with other medical conditions or who take certain common medications, allergy  shots may be of risk. It is important to mention other medications you talk to your allergist.   What are the two types of  build-ups offered:   RUSH or Rapid Desensitization -- one day of injections lasting from 8:30-4:30pm, injections every 1 hour.  Approximately half of the build-up process is completed in that one day.  The following week, normal build-up is resumed, and this entails ~16 visits either weekly or twice weekly, until reaching your "maintenance dose" which is continued weekly until eventually getting spaced out to every month for a duration of 3 to 5 years. The regular build-up appointments are nurse visits where the injections are administered, followed by required monitoring for 30 minutes.    Traditional build-up -- weekly visits for 6 -12 months until reaching "maintenance dose", then continue weekly until eventually spacing out to every 4 weeks as above. At these appointments, the injections are administered, followed by required monitoring for 30 minutes.     Either way is acceptable, and both are equally effective. With the rush protocol, the advantage is that less time is spent here for injections overall AND you would also reach maintenance dosing faster (which is when the clinical benefit starts to become more apparent). Not everyone is a candidate for rapid desensitization.   IF we proceed with the RUSH protocol, there are premedications which must be taken the day before and the day after the rush only (this includes antihistamines, steroids, and Singulair ).  After the rush day, no prednisone  or Singulair  is required, and we just recommend antihistamines taken on your injection day.  What Is An Estimate of the Costs?  If you are interested in starting allergy  injections, please check with your insurance company about your coverage for both allergy  vial sets and allergy  injections.  Please do so prior to making the appointment to start injections.  The following are CPT codes to give to your insurance company. These are the amounts we BILL to the insurance company, but the amount YOU WILL PAY and  WE RECEIVE IS SUBSTANTIALLY LESS and depends on the contracts we have with different insurance companies.   Amount Billed to Insurance One allergy  vial set  CPT 95165   $ 1200     Two allergy  vial set  CPT 95165   $ 2400     Three allergy  vial set  CPT 95165   $ 3600     One injection   CPT 95115   $ 35  Two injections   CPT 95117   $ 40 RUSH (Rapid Desensitization) CPT 95180 x 8 hours $500/hour  Regarding the allergy  injections, your co-pay may or may not apply with each injection, so please confirm this with your insurance company. When you start allergy  injections, 1 or 2 sets of vials are made based on your allergies.  Not all patients can be on one set of vials. A set of vials lasts 6 months to a year depending on how quickly you can proceed with your build-up of your allergy  injections. Vials are personalized for each patient depending on their specific allergens.  How often are allergy  injection given during the build-up period?   Injections are given at least weekly during the build-up  period until your maintenance dose is achieved. Per the doctor's discretion, you may have the option of getting allergy  injections two times per week during the build-up period. However, there must be at least 48 hours between injections. The build-up period is usually completed within 6-12 months depending on your ability to schedule injections and for adjustments for reactions. When maintenance dose is reached, your injection schedule is gradually changed to every two weeks and later to every three weeks. Injections will then continue every 4 weeks. Usually, injections are continued for a total of 3-5 years.   When Will I Feel Better?  Some may experience decreased allergy  symptoms during the build-up phase. For others, it may take as long as 12 months on the maintenance dose. If there is no improvement after a year of maintenance, your allergist will discuss other treatment options with you.  If you  aren't responding to allergy  shots, it may be because there is not enough dose of the allergen in your vaccine or there are missing allergens that were not identified during your allergy  testing. Other reasons could be that there are high levels of the allergen in your environment or major exposure to non-allergic triggers like tobacco smoke.  What Is the Length of Treatment?  Once the maintenance dose is reached, allergy  shots are generally continued for three to five years. The decision to stop should be discussed with your allergist at that time. Some people may experience a permanent reduction of allergy  symptoms. Others may relapse and a longer course of allergy  shots can be considered.  What Are the Possible Reactions?  The two types of adverse reactions that can occur with allergy  shots are local and systemic. Common local reactions include very mild redness and swelling at the injection site, which can happen immediately or several hours after. Report a delayed reaction from your last injection. These include arm swelling or runny nose, watery eyes or cough that occurs within 12-24 hours after injection. A systemic reaction, which is less common, affects the entire body or a particular body system. They are usually mild and typically respond quickly to medications. Signs include increased allergy  symptoms such as sneezing, a stuffy nose or hives.   Rarely, a serious systemic reaction called anaphylaxis can develop. Symptoms include swelling in the throat, wheezing, a feeling of tightness in the chest, nausea or dizziness. Most serious systemic reactions develop within 30 minutes of allergy  shots. This is why it is strongly recommended you wait in your doctor's office for 30 minutes after your injections. Your allergist is trained to watch for reactions, and his or her staff is trained and equipped with the proper medications to identify and treat them.   Report to the nurse immediately if you  experience any of the following symptoms: swelling, itching or redness of the skin, hives, watery eyes/nose, breathing difficulty, excessive sneezing, coughing, stomach pain, diarrhea, or light headedness. These symptoms may occur within 15-20 minutes after injection and may require medication.   Who Should Administer Allergy  Shots?  The preferred location for receiving shots is your prescribing allergist's office. Injections can sometimes be given at another facility where the physician and staff are trained to recognize and treat reactions, and have received instructions by your prescribing allergist.  What if I am late for an injection?   Injection dose will be adjusted depending upon how many days or weeks you are late for your injection.   What if I am sick?   Please report any  illness to the nurse before receiving injections. She may adjust your dose or postpone injections depending on your symptoms. If you have fever, flu, sinus infection or chest congestion it is best to postpone allergy  injections until you are better. Never get an allergy  injection if your asthma is causing you problems. If your symptoms persist, seek out medical care to get your health problem under control.  What If I am or Become Pregnant:  Women that become pregnant should schedule an appointment with [facility] before receiving any further allergy  injections.

## 2024-01-18 NOTE — Telephone Encounter (Signed)
 Discussed in her visit today.

## 2024-01-18 NOTE — Progress Notes (Signed)
 FOLLOW UP  Date of Service/Encounter:  01/18/24   Assessment:   Cough variant asthma    Perennial and seasonal allergic rhinitis (grasses, weeds, indoor molds, outdoor molds, dust mites, cat, dog, cockroach, and horse)   Eosinophilia - unknown cause, but likely uncontrolled allergic rhinitis  Plan/Recommendations:   1. Cough variant asthma - Lung testing looked excellent today. - We are not going to make any changes since Dr. Darlean is taking care of you.   2. Perennial and seasonal allergic rhinitis - with poor perception of symptoms - Testing today showed: grasses, weeds, indoor molds, outdoor molds, dust mites, cat, dog, cockroach, and horse - Copy of test results provided.  - Avoidance measures provided. - Start taking: Xyzal  (levocetirizine) 5mg  tablet once daily and Flonase  (fluticasone ) one spray per nostril daily (AIM FOR EAR ON EACH SIDE) - You can use an extra dose of the antihistamine, if needed, for breakthrough symptoms.  - Consider nasal saline rinses 1-2 times daily to remove allergens from the nasal cavities as well as help with mucous clearance (this is especially helpful to do before the nasal sprays are given) - Consider allergy  shots as a means of long-term control. - Allergy  shots re-train and reset the immune system to ignore environmental allergens and decrease the resulting immune response to those allergens (sneezing, itchy watery eyes, runny nose, nasal congestion, etc).    - Allergy  shots improve symptoms in 75-85% of patients.  - We can discuss more at the next appointment if the medications are not working for you.  3. Eosinophilia - unknown cause - Thankfully all your workup thus far has been normal. - We are going to get a few more labs to make sure that eosinophils have been invaded other organs.  - Testing  was positive to multiple indoor and outdoor allergens, so I think that this is still the most likely cause of your elevated eosinophils. -  The elevated protein in your blood is likely from your benign hematuria (blood in urine).  - We can recheck that at some point in the ne when we recheck your eosinophil level.xt 3 months   4. Return in about 3 months (around 04/18/2024). You can have the follow up appointment with Dr. Iva or a Nurse Practicioner (our Nurse Practitioners are excellent and always have Physician oversight!).   Subjective:   Rita  P Barry is a 58 y.o. female presenting today for follow up of  Chief Complaint  Patient presents with   Allergy  Testing    1-55    Gelsey  P Thall has a history of the following: Patient Active Problem List   Diagnosis Date Noted   Cough variant asthma 11/01/2023   Dense breast tissue 09/24/2022   Family history of breast cancer 09/24/2022   Trigger finger, right middle finger 10/18/2019   Esophagitis, 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   Erosive gastritis    Esophageal dysphagia 02/21/2016   Essential hypertension 11/26/2015   Allergic rhinitis 08/25/2015   Vitamin D  deficiency 08/23/2014   Hyperlipidemia 08/23/2014   GAD (generalized anxiety disorder) 09/18/2013   Benign paroxysmal positional vertigo 03/02/2013   Depression 08/08/2012   Fibromyalgia 08/08/2012    History obtained from: chart review and patient.  Discussed the use of AI scribe software for clinical note transcription with the patient and/or guardian, who gave verbal consent to proceed.  Rita Barry  is a 58 y.o. female presenting for skin testing. She was last seen on September 3rd. We could not do testing because her insurance company  does not cover testing on the same day as a New Patient visit. She has been off of all antihistamines 3 days in anticipation of the testing.   We last saw her on January 04, 2024.  At that time, her like testing was great.  For her rhinitis, we decided to do environmental allergy  testing.  For her eosinophilia, we obtained a lot of labs to rule out weird causes of elevated  eosinophils.  She has a history of elevated eosinophil count and experiences environmental allergies, particularly during the spring and fall seasons, with symptoms such as itching. She works with horses as a Teacher, adult education, spending several hours with them, though not daily. She also has two dogs at home.  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 She urinates frequently, a pattern that has persisted since childhood. Approximately 10 to 15 years ago, a test involving filling her bladder with liquid also showed proteinuria. She has not seen a kidney specialist for these issues. She has not experienced any significant symptoms related to these findings.  She has not noticed any flushing episodes from alcohol consumption and does not believe it affects her eosinophil levels.  Otherwise, there have been no changes to her past medical history, surgical history, family history, or social history.    Review of systems otherwise negative other than that mentioned in the HPI.    Objective:   Last menstrual period 04/16/2016. There is no height or weight on file to calculate BMI.    Physical exam deferred since this was a skin testing appointment only.   Diagnostic studies:   Allergy  Studies:     Airborne Adult Perc - 01/18/24 1000     Time Antigen Placed 0911    Allergen Manufacturer Jestine    Location Back    Number of Test -54    1. Control-Buffer 50% Glycerol Negative    2. Control-Histamine 2+    3. Bahia Negative    4. [place] 4+    5. Johnson Negative    6. [place]  Blue Negative    7. Meadow Fescue Negative    8. Perennial Rye 3+    9. Timothy 4+    10. Ragweed Mix Negative    11. Cocklebur Negative    12. Plantain,  English 2+    13. Baccharis Negative    14. Dog Fennel Negative    15. Russian Thistle Negative    16. Lamb's Quarters Negative    17. Sheep Sorrell Negative    18. Rough Pigweed Negative    19.  Marsh Elder, Rough Negative    20. Mugwort, Common Negative    21. Box, Elder Negative    22. Cedar, red Negative    23. Sweet Gum Negative    24. Pecan Pollen Negative    25. Pine Mix Negative    26. Walnut, Black Pollen Negative    27. Red Mulberry Negative    28. Ash Mix Negative    29. Birch Mix Negative    30. Beech American Negative    31. Cottonwood, Guinea[place]issau Negative    32. Hickory, White Negative    33. Maple Mix Negative    34. Oak, [place] Mix Negative    35. Sycamore Eastern Negative    36. Alternaria Alternata Negative    37. Cladosporium Herbarum Negative    38. Aspergillus Mix Negative    39. Penicillium Mix Negative    40. Bipolaris  Sorokiniana (Helminthosporium) Negative    41. Drechslera Spicifera (Curvularia) Negative    42. Mucor Plumbeus Negative    43. Fusarium Moniliforme Negative    44. Aureobasidium Pullulans (pullulara) Negative    45. Rhizopus Oryzae Negative    46. Botrytis Cinera Negative    47. Epicoccum Nigrum Negative    48. Phoma Betae Negative    49. Dust Mite Mix Negative    50. Cat Hair 10,000 BAU/ml Negative    51.  Dog Epithelia Negative    52. Mixed Feathers Negative    53. Horse Epithelia 2+    54. Cockroach, German 2+    55. Tobacco Leaf Negative          Intradermal - 01/18/24 0932     Time Antigen Placed 0945    Allergen Manufacturer Jestine    Location Arm    Number of Test 13    Control Negative    Bahia 4+    Johnson 3+    Ragweed Mix Negative    Weed Mix Negative    Tree Mix Negative    Mold 1 Negative    Mold 2 3+    Mold 3 3+    Mold 4 2+    Mite Mix 3+    Cat 1+    Dog 3+          Allergy  testing results were read and interpreted by myself, documented by clinical staff.      Marty Shaggy, MD  [facility] 

## 2024-01-19 MED ORDER — CLOBETASOL PROPIONATE 0.05 % EX OINT: 1.0000 | TOPICAL_OINTMENT | Freq: Two times a day (BID) | CUTANEOUS | 0 refills | Status: AC

## 2024-01-19 NOTE — Addendum Note (Signed)
 Addended by: IVA MARTY SALTNESS on: 01/19/2024 12:22 PM   Modules accepted: Orders

## 2024-01-20 LAB — OVA AND PARASITE EXAMINATION

## 2024-01-20 LAB — B12 AND FOLATE PANEL
Folate: 13.7 ng/mL (ref >3.0)
Vitamin B-12: 1257 pg/mL — ABNORMAL HIGH (ref 232–1245)

## 2024-01-20 LAB — TRYPTASE: Tryptase: 8.2 ug/L (ref 2.2–13.2)

## 2024-01-20 LAB — TROPONIN T: Troponin T (Highly Sensitive): <6 ng/L (ref 0–14)

## 2024-02-20 ENCOUNTER — Ambulatory Visit: Admitting: Family

## 2024-03-02 ENCOUNTER — Ambulatory Visit: Admitting: Family

## 2024-03-02 ENCOUNTER — Encounter: Payer: Self-pay | Admitting: Family

## 2024-03-02 VITALS — BP 116/73 | HR 82 | Temp 98.5°F | Ht 66.0 in | Wt 169.4 lb

## 2024-03-02 DIAGNOSIS — J309 Allergic rhinitis, unspecified: Secondary | ICD-10-CM | POA: Diagnosis not present

## 2024-03-02 DIAGNOSIS — Z713 Dietary counseling and surveillance: Secondary | ICD-10-CM

## 2024-03-02 DIAGNOSIS — R509 Fever, unspecified: Secondary | ICD-10-CM

## 2024-03-02 DIAGNOSIS — J45991 Cough variant asthma: Secondary | ICD-10-CM | POA: Diagnosis not present

## 2024-03-02 DIAGNOSIS — M797 Fibromyalgia: Secondary | ICD-10-CM

## 2024-03-02 DIAGNOSIS — F331 Major depressive disorder, recurrent, moderate: Secondary | ICD-10-CM

## 2024-03-02 DIAGNOSIS — I10 Essential (primary) hypertension: Secondary | ICD-10-CM

## 2024-03-02 DIAGNOSIS — F411 Generalized anxiety disorder: Secondary | ICD-10-CM

## 2024-03-02 DIAGNOSIS — E785 Hyperlipidemia, unspecified: Secondary | ICD-10-CM

## 2024-03-02 DIAGNOSIS — H8110 Benign paroxysmal vertigo, unspecified ear: Secondary | ICD-10-CM | POA: Diagnosis not present

## 2024-03-02 DIAGNOSIS — H9191 Unspecified hearing loss, right ear: Secondary | ICD-10-CM

## 2024-03-02 DIAGNOSIS — E559 Vitamin D deficiency, unspecified: Secondary | ICD-10-CM

## 2024-03-02 LAB — VERITOR FLU A/B WAIVED
Influenza A: NEGATIVE
Influenza B: NEGATIVE

## 2024-03-02 MED ORDER — PHENTERMINE HCL 37.5 MG PO TABS: 37.5000 mg | ORAL_TABLET | Freq: Every day | ORAL | 2 refills | Status: AC

## 2024-03-02 MED ORDER — DIAZEPAM 2 MG PO TABS: 2.0000 mg | ORAL_TABLET | Freq: Two times a day (BID) | ORAL | 0 refills | Status: AC | PRN

## 2024-03-02 NOTE — Patient Instructions (Signed)
 Vertigo Vertigo is the feeling that you or the things around you are moving or spinning when they're not. It's different than feeling dizzy. It can also cause: Loss of balance. Trouble standing or walking. Nausea and vomiting. This feeling can come and go at any time. It can last from a few seconds to minutes or even hours. It may go away on its own or be treated with medicine. What are the types of vertigo? There are two types of vertigo: Peripheral vertigo happens when parts of your inner ear don't work like they should. This is the more common type. Central vertigo happens when your brain and spinal cord don't work like they should. Your health care provider will do tests to find out what kind of vertigo you have. This will help them decide on the right treatment for you. Follow these instructions at home: Eating and drinking Drink enough fluid to keep your pee (urine) pale yellow. Do not drink alcohol. Activity When you get up in the morning, first sit up on the side of the bed. When you feel okay, stand slowly while holding onto something. Move slowly. Avoid sudden body or head movements. Avoid certain positions, as told by your provider. Use a cane if you have trouble standing or walking. Sit down right away if you feel unsteady. Place items in your home so they're easy for you to reach without bending or leaning over. Return to normal activities when you're told. Ask what things are safe for you to do. General instructions Take your medicines only as told by your provider. Contact a health care provider if: Your medicines don't help or make your vertigo worse. You get new symptoms. You have a fever. You have nausea or vomiting. Your family or friends spot any changes in how you're acting. A part of your body goes numb. You feel tingling and prickling in a part of your body. You get very bad headaches. Get help right away if: You're always dizzy or you faint. You have a  stiff neck. You have trouble moving or speaking. Your hands, arms, or legs feel weak. Your hearing or eyesight changes. These symptoms may be an emergency. Call 911 right away. Do not wait to see if the symptoms will go away. Do not drive yourself to the hospital. This information is not intended to replace advice given to you by your health care provider. Make sure you discuss any questions you have with your health care provider. Document Revised: 01/20/2023 Document Reviewed: 07/23/2022 Elsevier Patient Education  2024 [employer].

## 2024-03-02 NOTE — Progress Notes (Signed)
 Subjective:    Patient ID: Rita Barry  SHAUNNA Coder, female    DOB: November 10, 1965, 58 y.o.   MRN: [id number]  Chief Complaint  Patient presents with   Medical Management of Chronic Issues   PT presents to the office today for  chronic follow up. She has fibromyalgia that is stable. [place] and is very active and states she has generalized pain in the evenings of 6 out 10.    She reports she stopped her Ultram . She is complaining of vertigo. Reports she has had this on and off for years and use to see ENT, however, he has retired. She reports over the last month she has had dizziness with bending over and looking up. She rides horses daily and this has interfered with this. She takes valium  as needed. Complaining of right ear fullness and decrease hearing.   Followed by Pulmonologist as needed for cough variant asthma. Followed by Allergen annually.    She is currently taking Wegovy  2.4  mg. She has lost 39 lbs. However, her insurance will no longer pay for it. She has gained 10 lbs and requesting medication.      03/02/2024   10:23 AM 01/04/2024    9:59 AM 12/13/2023   10:42 AM  Last 3 Weights  Weight (lbs) 169 lb 6.4 oz 167 lb 9.6 oz 171 lb 6.4 oz  Weight (kg) 76.839 kg 76.023 kg 77.747 kg     Dizziness This is a chronic problem. The current episode started more than 1 year ago. The problem occurs intermittently. The problem has been waxing and waning. Associated symptoms include congestion, fatigue, headaches, a sore throat and vertigo. Pertinent negatives include no coughing or vomiting. The symptoms are aggravated by bending. She has tried rest and relaxation for the symptoms. The treatment provided mild relief.  Hypertension This is a chronic problem. The current episode started more than 1 year ago. The problem has been resolved since onset. The problem is controlled. Associated symptoms include anxiety and headaches. Pertinent negatives include no malaise/fatigue, peripheral edema  or shortness of breath. Risk factors for coronary artery disease include dyslipidemia. Past treatments include diuretics. The current treatment provides moderate improvement.  Anxiety Presents for follow-up visit. Symptoms include dizziness, excessive worry and nervous/anxious behavior. Patient reports no shortness of breath. Symptoms occur rarely. The severity of symptoms is mild.    Hyperlipidemia This is a chronic problem. The current episode started more than 1 year ago. The problem is uncontrolled. Recent lipid tests were reviewed and are high. Pertinent negatives include no shortness of breath. Current antihyperlipidemic treatment includes diet change. The current treatment provides mild improvement of lipids. Risk factors for coronary artery disease include dyslipidemia, hypertension, a sedentary lifestyle and post-menopausal.  Depression        This is a chronic problem.  The current episode started more than 1 year ago.   The problem occurs intermittently.  Associated symptoms include fatigue and headaches.  Associated symptoms include no helplessness, no hopelessness and not sad.  Past treatments include SSRIs - Selective serotonin reuptake inhibitors.  Past medical history includes anxiety.   URI  This is a new problem. The current episode started today. The problem has been unchanged. The maximum temperature recorded prior to her arrival was 100.4 - 100.9 F. Associated symptoms include congestion, headaches, joint pain, rhinorrhea, sneezing and a sore throat. Pertinent negatives include no coughing, ear pain, sinus pain or vomiting. She has tried increased fluids for the symptoms. The  treatment provided mild relief.      Review of Systems  Constitutional:  Positive for fatigue. Negative for malaise/fatigue.  HENT:  Positive for congestion, rhinorrhea, sneezing and sore throat. Negative for ear pain and sinus pain.   Respiratory:  Negative for cough and shortness of breath.    Gastrointestinal:  Negative for vomiting.  Musculoskeletal:  Positive for joint pain.  Neurological:  Positive for dizziness, vertigo and headaches.  Psychiatric/Behavioral:  The patient is nervous/anxious.   All other systems reviewed and are negative.  Family History  Problem Relation Age of Onset   Cancer Father        Throat   Hypertension Mother    Breast cancer Mother    Cancer Mother    Diabetes Maternal Grandmother    Arthritis Maternal Grandmother    Social History   Socioeconomic History   Marital status: Married    Spouse name: Not on file   Number of children: Not on file   Years of education: Not on file   Highest education level: Bachelor's degree (e.g., BA, AB, BS)  Occupational History   Occupation: Works with Horses  Tobacco Use   Smoking status: Never   Smokeless tobacco: Never  Vaping Use   Vaping status: Never Used  Substance and Sexual Activity   Alcohol use: Yes    Comment: 2-3 glassses of wine weekly   Drug use: No   Sexual activity: Yes  Other Topics Concern   Not on file  Social History Narrative   Not on file   Social Drivers of Health   Financial Resource Strain: Low Risk  (12/23/2022)   Overall Financial Resource Strain (CARDIA)    Difficulty of Paying Living Expenses: Not hard at all  Food Insecurity: No Food Insecurity (03/02/2024)   Hunger Vital Sign    Worried About Running Out of Food in the Last Year: Never true    Ran Out of Food in the Last Year: Never true  Transportation Needs: No Transportation Needs (03/02/2024)   PRAPARE - Administrator, Civil Service (Medical): No    Lack of Transportation (Non-Medical): No  Physical Activity: Sufficiently Active (12/23/2022)   Exercise Vital Sign    Days of Exercise per Week: 5 days    Minutes of Exercise per Session: 60 min  Stress: No Stress Concern Present (12/23/2022)   [employer] of Occupational Health - Occupational Stress Questionnaire    Feeling of Stress  : Not at all  Social Connections: Unknown (12/23/2022)   Social Connection and Isolation Panel    Frequency of Communication with Friends and Family: Twice a week    Frequency of Social Gatherings with Friends and Family: Once a week    Attends Religious Services: Patient declined    Database Administrator or Organizations: Yes    Attends Engineer, Structural: More than 4 times per year    Marital Status: Married       Objective:   Physical Exam Vitals reviewed.  Constitutional:      General: She is not in acute distress.    Appearance: She is well-developed.  HENT:     Head: Normocephalic and atraumatic.     Right Ear: A middle ear effusion is present.     Left Ear: Tympanic membrane normal.     Mouth/Throat:     Pharynx: Posterior oropharyngeal erythema present.  Eyes:     Pupils: Pupils are equal, round, and reactive to light.  Neck:  Thyroid : No thyromegaly.  Cardiovascular:     Rate and Rhythm: Normal rate and regular rhythm.     Heart sounds: Normal heart sounds. No murmur heard. Pulmonary:     Effort: Pulmonary effort is normal. No respiratory distress.     Breath sounds: Normal breath sounds. No wheezing.  Abdominal:     General: Bowel sounds are normal. There is no distension.     Palpations: Abdomen is soft.     Tenderness: There is no abdominal tenderness.  Musculoskeletal:        General: No tenderness. Normal range of motion.     Cervical back: Normal range of motion and neck supple.  Skin:    General: Skin is warm and dry.  Neurological:     Mental Status: She is alert and oriented to person, place, and time.     Cranial Nerves: No cranial nerve deficit.     Deep Tendon Reflexes: Reflexes are normal and symmetric.  Psychiatric:        Behavior: Behavior normal.        Thought Content: Thought content normal.        Judgment: Judgment normal.       BP 116/73   Pulse 82   Temp 98.5 F (36.9 C)   Ht 5' 6 (1.676 m)   Wt 169 lb 6.4 oz  (76.8 kg)   LMP 04/16/2016 (Approximate)   SpO2 97%   BMI 27.34 kg/m      Assessment & Plan:   Novice  P Hyden comes in today with chief complaint of Medical Management of Chronic Issues   Diagnosis and orders addressed:  1. Weight loss counseling, encounter for - phentermine  (ADIPEX-P ) 37.5 MG tablet; Take 1 tablet (37.5 mg total) by mouth daily before breakfast.  Dispense: 30 tablet; Refill: 2  2. Benign paroxysmal positional vertigo, unspecified laterality - diazepam  (VALIUM ) 2 MG tablet; Take 1 tablet (2 mg total) by mouth every 12 (twelve) hours as needed for anxiety.  Dispense: 30 tablet; Refill: 0 - Ambulatory referral to ENT  3. Allergic rhinitis, unspecified seasonality, unspecified trigger (Primary)  4. Cough variant asthma  5. Moderate episode of recurrent major depressive disorder (HCC)  6. Essential hypertension  7. Fibromyalgia  8. GAD (generalized anxiety disorder)  9. Hyperlipidemia, unspecified hyperlipidemia type   10. Vitamin D  deficiency  11. Fever, unspecified fever cause - Take meds as prescribed - Use a cool mist humidifier  -Use saline nose sprays frequently -Force fluids -For any cough or congestion  Use plain Mucinex- regular strength or max strength is fine -For fever or aces or pains- take tylenol  or ibuprofen. -Throat lozenges if help -Follow up if symptoms worsen or do not improve  - Veritor Flu A/B Waived - Novel Coronavirus, NAA ([facility]); Future  12. Decreased hearing, right - Ambulatory referral to ENT   Labs pending PT reviewed in [place] controlled database, no red flags  Referral to ENT placed for recurrent dizziness and decrease right hearing and fullness Health Maintenance reviewed Diet and exercise encouraged  Follow up plan: 6 months    Bari Learn, FNP

## 2024-03-03 LAB — NOVEL CORONAVIRUS, NAA: SARS-CoV-2, NAA: NOT DETECTED

## 2024-03-05 ENCOUNTER — Ambulatory Visit: Payer: Self-pay | Admitting: Family

## 2024-04-03 DIAGNOSIS — Z1211 Encounter for screening for malignant neoplasm of colon: Secondary | ICD-10-CM

## 2024-04-04 ENCOUNTER — Other Ambulatory Visit (HOSPITAL_COMMUNITY): Payer: Self-pay

## 2024-04-11 ENCOUNTER — Other Ambulatory Visit: Payer: Self-pay

## 2024-04-20 LAB — COLOGUARD: COLOGUARD: NEGATIVE

## 2024-04-23 ENCOUNTER — Ambulatory Visit: Payer: Self-pay | Admitting: Family

## 2024-04-24 ENCOUNTER — Institutional Professional Consult (permissible substitution) (INDEPENDENT_AMBULATORY_CARE_PROVIDER_SITE_OTHER): Admitting: Otolaryngology

## 2024-05-03 ENCOUNTER — Other Ambulatory Visit: Payer: Self-pay | Admitting: Family

## 2024-05-03 DIAGNOSIS — F331 Major depressive disorder, recurrent, moderate: Secondary | ICD-10-CM

## 2024-05-03 DIAGNOSIS — F411 Generalized anxiety disorder: Secondary | ICD-10-CM

## 2024-05-04 ENCOUNTER — Ambulatory Visit: Admitting: Allergy & Immunology

## 2024-05-11 ENCOUNTER — Other Ambulatory Visit: Payer: Self-pay | Admitting: Family

## 2024-05-11 DIAGNOSIS — I10 Essential (primary) hypertension: Secondary | ICD-10-CM

## 2024-05-15 ENCOUNTER — Institutional Professional Consult (permissible substitution) (INDEPENDENT_AMBULATORY_CARE_PROVIDER_SITE_OTHER): Admitting: Otolaryngology

## 2024-06-18 ENCOUNTER — Ambulatory Visit: Admitting: Allergy & Immunology

## 2024-06-26 ENCOUNTER — Institutional Professional Consult (permissible substitution) (INDEPENDENT_AMBULATORY_CARE_PROVIDER_SITE_OTHER)
